# Patient Record
Sex: Male | Born: 1960 | Race: White | Hispanic: No | Marital: Single | Smoking: Current every day smoker
Health system: Southern US, Community
[De-identification: ages and names within clinical notes are randomized; demographics above are authoritative.]

## PROBLEM LIST (undated history)

## (undated) DIAGNOSIS — F419 Anxiety disorder, unspecified: Secondary | ICD-10-CM

## (undated) DIAGNOSIS — G709 Myoneural disorder, unspecified: Secondary | ICD-10-CM

## (undated) DIAGNOSIS — F32A Depression, unspecified: Secondary | ICD-10-CM

## (undated) DIAGNOSIS — S83209A Unspecified tear of unspecified meniscus, current injury, unspecified knee, initial encounter: Secondary | ICD-10-CM

## (undated) DIAGNOSIS — F329 Major depressive disorder, single episode, unspecified: Secondary | ICD-10-CM

## (undated) DIAGNOSIS — K759 Inflammatory liver disease, unspecified: Secondary | ICD-10-CM

## (undated) DIAGNOSIS — F4310 Post-traumatic stress disorder, unspecified: Secondary | ICD-10-CM

## (undated) DIAGNOSIS — F99 Mental disorder, not otherwise specified: Secondary | ICD-10-CM

## (undated) DIAGNOSIS — E079 Disorder of thyroid, unspecified: Secondary | ICD-10-CM

## (undated) DIAGNOSIS — E039 Hypothyroidism, unspecified: Secondary | ICD-10-CM

## (undated) HISTORY — PX: TESTICLAR CYST EXCISION: SHX2492

## (undated) HISTORY — PX: NO PAST SURGERIES: SHX2092

---

## 2011-11-25 ENCOUNTER — Encounter (HOSPITAL_COMMUNITY): Payer: Self-pay | Admitting: Emergency Medicine

## 2011-11-25 ENCOUNTER — Emergency Department (HOSPITAL_COMMUNITY)
Admission: EM | Admit: 2011-11-25 | Discharge: 2011-11-27 | Disposition: A | Discharge status: Other Acute Inpatient Hospital | Payer: Medicaid Other | Attending: Emergency Medicine | Admitting: Emergency Medicine

## 2011-11-25 DIAGNOSIS — R45850 Homicidal ideations: Secondary | ICD-10-CM | POA: Insufficient documentation

## 2011-11-25 DIAGNOSIS — F1010 Alcohol abuse, uncomplicated: Secondary | ICD-10-CM | POA: Insufficient documentation

## 2011-11-25 DIAGNOSIS — R45851 Suicidal ideations: Secondary | ICD-10-CM | POA: Insufficient documentation

## 2011-11-25 DIAGNOSIS — Z79899 Other long term (current) drug therapy: Secondary | ICD-10-CM | POA: Insufficient documentation

## 2011-11-25 DIAGNOSIS — E079 Disorder of thyroid, unspecified: Secondary | ICD-10-CM | POA: Insufficient documentation

## 2011-11-25 DIAGNOSIS — R109 Unspecified abdominal pain: Secondary | ICD-10-CM | POA: Insufficient documentation

## 2011-11-25 HISTORY — DX: Disorder of thyroid, unspecified: E07.9

## 2011-11-25 LAB — CBC
HCT: 40 % (ref 39.0–52.0)
MCV: 87.5 fL (ref 78.0–100.0)
RBC: 4.57 MIL/uL (ref 4.22–5.81)
WBC: 7.2 10*3/uL (ref 4.0–10.5)

## 2011-11-25 LAB — COMPREHENSIVE METABOLIC PANEL
BUN: 17 mg/dL (ref 6–23)
CO2: 22 mEq/L (ref 19–32)
Chloride: 99 mEq/L (ref 96–112)
Creatinine, Ser: 0.86 mg/dL (ref 0.50–1.35)
GFR calc Af Amer: >90 mL/min (ref >90)
GFR calc non Af Amer: >90 mL/min (ref >90)
Total Bilirubin: 0.4 mg/dL (ref 0.3–1.2)

## 2011-11-25 LAB — ETHANOL: Alcohol, Ethyl (B): 187 mg/dL — ABNORMAL HIGH (ref 0–11)

## 2011-11-25 LAB — RAPID URINE DRUG SCREEN, HOSP PERFORMED: Opiates: NOT DETECTED

## 2011-11-25 MED ORDER — LORAZEPAM 1 MG PO TABS
1.0000 mg | ORAL_TABLET | Freq: Three times a day (TID) | ORAL | Status: DC | PRN
  Administered 2011-11-26 – 2011-11-27 (×2): 1 mg via ORAL
  Filled 2011-11-25 (×2): qty 1

## 2011-11-25 MED ORDER — ZOLPIDEM TARTRATE 5 MG PO TABS
5.0000 mg | ORAL_TABLET | Freq: Every evening | ORAL | Status: DC | PRN
  Administered 2011-11-26: 5 mg via ORAL
  Filled 2011-11-25: qty 1

## 2011-11-25 MED ORDER — ZIPRASIDONE MESYLATE 20 MG IM SOLR
20.0000 mg | Freq: Once | INTRAMUSCULAR | Status: AC
  Administered 2011-11-25: 20 mg via INTRAMUSCULAR
  Filled 2011-11-25: qty 20

## 2011-11-25 MED ORDER — NICOTINE 21 MG/24HR TD PT24
21.0000 mg | MEDICATED_PATCH | Freq: Every day | TRANSDERMAL | Status: DC
  Filled 2011-11-25: qty 1

## 2011-11-25 NOTE — ED Provider Notes (Signed)
History     CSN: [id number]  Arrival date & time 11/25/11  1454   First MD Initiated Contact with Patient 11/25/11 1528      Chief Complaint  Patient presents with  . V70.1  . Abdominal Pain    The history is provided by the patient.   patient reports 5 days of drinking alcohol and using cocaine.  He now reports thoughts of wanting to hurt himself and wanting to hurt others.  He states he would be better off dead.  He's been having auditory hallucinations.  He also reports use of marijuana.  He has no specific plans at this time.  He is compliant with his medications.  He has a history of hypothyroidism is on Synthroid.  He denies fevers or chills.  He has no nausea or vomiting.  He reports some mild upper abdominal discomfort for several days.   Past Medical History  Diagnosis Date  . Thyroid disease     No past surgical history on file.  No family history on file.  History  Substance Use Topics  . Smoking status: Not on file  . Smokeless tobacco: Not on file  . Alcohol Use:       Review of Systems  All other systems reviewed and are negative.    Allergies  Review of patient's allergies indicates no known allergies.  Home Medications   Current Outpatient Rx  Name Route Sig Dispense Refill  . IBUPROFEN 200 MG PO TABS Oral Take 800 mg by mouth every 6 (six) hours as needed. Pain    . LEVOTHYROXINE SODIUM 100 MCG PO TABS Oral Take 100 mcg by mouth daily.    Marland Kitchen PAROXETINE HCL 20 MG PO TABS Oral Take 40 mg by mouth every morning.      BP 133/80  Pulse 91  Temp 99 F (37.2 C) (Oral)  Resp 18  SpO2 97%  Physical Exam  Nursing note and vitals reviewed. Constitutional: He is oriented to person, place, and time. He appears well-developed and well-nourished.  HENT:  Head: Normocephalic and atraumatic.  Eyes: EOM are normal.  Neck: Normal range of motion.  Cardiovascular: Normal rate, regular rhythm, normal heart sounds and intact distal pulses.     Pulmonary/Chest: Effort normal and breath sounds normal. No respiratory distress.  Abdominal: Soft. He exhibits no distension. There is no tenderness.  Musculoskeletal: Normal range of motion.  Neurological: He is alert and oriented to person, place, and time.  Skin: Skin is warm and dry.  Psychiatric:       Manic behavior.  Continual movements in the room    ED Course  Procedures (including critical care time)  Labs Reviewed  CBC - Abnormal; Notable for the following:    MCHC 36.3 (*)     All other components within normal limits  URINE RAPID DRUG SCREEN (HOSP PERFORMED) - Abnormal; Notable for the following:    Cocaine POSITIVE (*)     All other components within normal limits  COMPREHENSIVE METABOLIC PANEL  ETHANOL   No results found.   No diagnosis found.    MDM  [facility] to evaluate at this time for placement for SI and HI        Lyanne Co, MD 11/25/11 [phone]

## 2011-11-25 NOTE — ED Notes (Signed)
Pt has been changed into Edward [name] scrubs and wanded by security. 

## 2011-11-25 NOTE — ED Notes (Signed)
3 duffle bags and 1 pt belongings bag locked in activity room

## 2011-11-25 NOTE — ED Notes (Signed)
Report to Ninetta Lights, RN in Psych ED. Transferred to rm 43- clothing placed in locker 43

## 2011-11-25 NOTE — ED Notes (Signed)
Hx gastritis per patient-- abd pain started 3 days ago-- after drinking ETOH, admits to drinking heavily for past 5 days, has been smoking crack, last used last night,   Has been hearing voices for 3 days- states "would be better off dead" it is a woman's voice-sounds like mother, has been in state hospital in [place], has been treated for PTSD, major depression and anxiety, bipolar.   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  Has exaggerated movements, facial movements.

## 2011-11-25 NOTE — ED Notes (Signed)
Up to the bathroom ambulatory w/o difficulty 

## 2011-11-25 NOTE — ED Notes (Signed)
Pt has two large duffle bags and one pt belonging bags. Pt has a knife locked up by security

## 2011-11-26 LAB — HEPATIC FUNCTION PANEL
ALT: 149 U/L — ABNORMAL HIGH (ref 0–53)
Albumin: 3.5 g/dL (ref 3.5–5.2)
Alkaline Phosphatase: 73 U/L (ref 39–117)
Total Protein: 7.3 g/dL (ref 6.0–8.3)

## 2011-11-26 LAB — LIPASE, BLOOD: Lipase: 82 U/L — ABNORMAL HIGH (ref 11–59)

## 2011-11-26 MED ORDER — GABAPENTIN 600 MG PO TABS
600.0000 mg | ORAL_TABLET | Freq: Every day | ORAL | Status: DC
  Filled 2011-11-26: qty 1

## 2011-11-26 MED ORDER — THIAMINE HCL 100 MG/ML IJ SOLN: 100.0000 mg | Freq: Every day | INTRAMUSCULAR | Status: DC

## 2011-11-26 MED ORDER — LORAZEPAM 1 MG PO TABS
0.0000 mg | ORAL_TABLET | Freq: Four times a day (QID) | ORAL | Status: DC
Start: 2011-11-26 — End: 2011-11-27
  Administered 2011-11-26 – 2011-11-27 (×2): 1 mg via ORAL
  Filled 2011-11-26 (×2): qty 1

## 2011-11-26 MED ORDER — GABAPENTIN 300 MG PO CAPS
600.0000 mg | ORAL_CAPSULE | Freq: Every day | ORAL | Status: DC
  Administered 2011-11-26 – 2011-11-27 (×2): 600 mg via ORAL
  Filled 2011-11-26 (×2): qty 2

## 2011-11-26 MED ORDER — PAROXETINE HCL 20 MG PO TABS
40.0000 mg | ORAL_TABLET | Freq: Every day | ORAL | Status: DC
  Administered 2011-11-26 – 2011-11-27 (×2): 40 mg via ORAL
  Filled 2011-11-26 (×2): qty 2

## 2011-11-26 MED ORDER — LORAZEPAM 1 MG PO TABS: 0.0000 mg | ORAL_TABLET | Freq: Two times a day (BID) | ORAL | Status: DC

## 2011-11-26 MED ORDER — VITAMIN B-1 100 MG PO TABS
100.0000 mg | ORAL_TABLET | Freq: Every day | ORAL | Status: DC
  Administered 2011-11-26 – 2011-11-27 (×2): 100 mg via ORAL
  Filled 2011-11-26 (×2): qty 1

## 2011-11-26 MED ORDER — FOLIC ACID 1 MG PO TABS
1.0000 mg | ORAL_TABLET | Freq: Every day | ORAL | Status: DC
  Administered 2011-11-26 – 2011-11-27 (×2): 1 mg via ORAL
  Filled 2011-11-26 (×2): qty 1

## 2011-11-26 MED ORDER — LURASIDONE HCL 40 MG PO TABS
40.0000 mg | ORAL_TABLET | Freq: Every day | ORAL | Status: DC
  Administered 2011-11-27: 40 mg via ORAL
  Filled 2011-11-26 (×2): qty 1

## 2011-11-26 MED ORDER — LEVOTHYROXINE SODIUM 100 MCG PO TABS
100.0000 ug | ORAL_TABLET | Freq: Every day | ORAL | Status: DC
  Administered 2011-11-26 – 2011-11-27 (×2): 100 ug via ORAL
  Filled 2011-11-26 (×2): qty 1

## 2011-11-26 MED ORDER — ADULT MULTIVITAMIN W/MINERALS CH
1.0000 | ORAL_TABLET | Freq: Every day | ORAL | Status: DC
  Administered 2011-11-26 – 2011-11-27 (×2): 1 via ORAL
  Filled 2011-11-26 (×2): qty 1

## 2011-11-26 NOTE — B[facility] Counselor (Signed)
Pt submitted for admission to [facility] by ED assessment counselor. Consulted with Shalita Forrest, AC who confirmed no adult beds are currently available. Consulted Neil Mashburn, PA who recommended Pt be re-run by psychiatrist when appropriate bed is available. Notified ED counselor of disposition.  Oluwadarasimi Favor Ellis Teodora Baumgarten Jr, LPC 

## 2011-11-26 NOTE — ED Notes (Signed)
Up to bathroom

## 2011-11-26 NOTE — ED Notes (Signed)
Spoke to [employer], told him the pt drinks ETOH more than we thought on admission and is starting to have withdrawal symptoms. EDP will put pt on the CIWA protocol.

## 2011-11-26 NOTE — ED Notes (Signed)
Pt up to bathroom.

## 2011-11-26 NOTE — ED Notes (Signed)
RN in drawing labs on pt

## 2011-11-26 NOTE — ED Notes (Signed)
Watching tv, eating sandwich

## 2011-11-26 NOTE — ED Provider Notes (Signed)
Pt resting this am.  Here with abdominal pain, substance abuse problems.  Slight increase in lfts and lipase noted.  Will repeat this am.  Will continue to monitor.  Filed Vitals:   11/26/11 0530  BP: 126/83  Pulse: 66  Temp: 98 F (36.7 C)  Resp: 21     Celene Kras, MD 11/26/11 [phone]

## 2011-11-26 NOTE — ED Notes (Signed)
EDP in with pt 

## 2011-11-26 NOTE — BH Assessment (Signed)
Assessment Note   Edward [name] is a 51 y.o. male who presents to [facility] voluntarily, endorsing SI with a plan to cut his wrists. Pt also is endorsing auditory hallucination, stating he hears voices when he is going through a stressful event. He reports he has a history of metal health concerns, starting 20 years ago, and has been diagnosed with PTSD. He reports current precipitating factors include breaking up with girlfriend, financial difficulties, and no family support. He reports 3 previous suicide attempts by overdose, most recent of which was in 2011. Pt reports has received inpatient mental health services several times in [place]. Pt states he was receiving outpatient treatment at [facility] earlier this year but is not currently receiving treatment. He denies being on any medication.   Pt denies HI, and visual hallucinations. Pt endorses SA, stating he uses 700 dollars worth of crack cocaine, a "couple of times a month" and drinks several beers a few times monthly.   Pt reports he can not contract for safety at this time.     Axis I: 296.34  Major Depressive Disorder, Recurrent, Severe With Psychotic Features  Axis II: Deferred Axis III:  Past Medical History  Diagnosis Date  . Thyroid disease    Axis IV: housing problems, other psychosocial or environmental problems and problems related to social environment Axis V: 31-40 impairment in reality testing  Past Medical History:  Past Medical History  Diagnosis Date  . Thyroid disease     No past surgical history on file.  Family History: No family history on file.  Social History:  does not have a smoking history on file. He does not have any smokeless tobacco history on file. His alcohol and drug histories not on file.  Additional Social History:  Alcohol / Drug Use History of alcohol / drug use?: Yes Substance #1 Name of Substance 1: alcohol 1 - Amount (size/oz): several beers 1 - Frequency: a couple times a month 1 -  Duration: for years 1 - Last Use / Amount: 11/25/11 Substance #2 Name of Substance 2: crack cocaine 2 - Age of First Use: 40s  2 - Amount (size/oz): 700 dollars worth 2 - Frequency: a couple of times a month 2 - Duration: on and off for 10 years 2 - Last Use / Amount: 11/25/11  CIWA: CIWA-Ar BP: 113/68 mmHg Pulse Rate: 80  COWS:    Allergies: No Known Allergies  Home Medications:  (Not in a hospital admission)  OB/GYN Status:  No LMP for male patient.  General Assessment Data Location of Assessment: [facility] Living Arrangements: Alone;Other (Comment) (homeless) Can pt return to current living arrangement?: Yes Admission Status: Voluntary Is patient capable of signing voluntary admission?: Yes Transfer from: Acute Hospital Referral Source: MD  Education Status Is patient currently in school?: No Highest grade of school patient has completed: GED  Risk to self Suicidal Ideation: Yes-Currently Present Suicidal Intent: Yes-Currently Present Is patient at risk for suicide?: Yes Suicidal Plan?: Yes-Currently Present Specify Current Suicidal Plan: cut wrists  Access to Means: No What has been your use of drugs/alcohol within the last 12 months?: alcohol and crack cocaine Previous Attempts/Gestures: Yes How many times?: 3  Other Self Harm Risks: none Triggers for Past Attempts: Hallucinations Intentional Self Injurious Behavior: None Family Suicide History: No Recent stressful life event(s): Conflict (Comment) (seperation from girlfriend) Persecutory voices/beliefs?: No Depression: Yes Depression Symptoms: Despondent;Loss of interest in usual pleasures;Feeling worthless/self pity;Guilt Substance abuse history and/or treatment for substance abuse?: Yes  Suicide prevention information given to non-admitted patients: Not applicable  Risk to Others Homicidal Ideation: No Thoughts of Harm to Others: No Current Homicidal Intent: No Current Homicidal Plan: No Access to Homicidal  Means: No Identified Victim: none History of harm to others?: No Assessment of Violence: None Noted Violent Behavior Description: cooperative Does patient have access to weapons?: No Criminal Charges Pending?: No Does patient have a court date: No  Psychosis Hallucinations: None noted Delusions: None noted  Mental Status Report Appear/Hygiene: Disheveled Eye Contact: Good Motor Activity: Unremarkable Speech: Logical/coherent Level of Consciousness: Quiet/awake Mood: Depressed Affect: Appropriate to circumstance Anxiety Level: None Thought Processes: Coherent;Relevant Judgement: Impaired Orientation: Person;Place;Time;Situation Obsessive Compulsive Thoughts/Behaviors: None  Cognitive Functioning Concentration: Normal Memory: Recent Intact;Remote Intact IQ: Average Insight: Poor Impulse Control: Poor Appetite: Fair Weight Loss: 0  Weight Gain: 0  Sleep: Decreased Total Hours of Sleep:  (states he has not slept in 3 days) Vegetative Symptoms: Staying in bed  ADLScreening [facility] Assessment Services) Patient's cognitive ability adequate to safely complete daily activities?: Yes Patient able to express need for assistance with ADLs?: Yes Independently performs ADLs?: Yes  Abuse/Neglect [facility]) Physical Abuse: Denies Verbal Abuse: Denies Sexual Abuse: Denies  Prior Inpatient Therapy Prior Inpatient Therapy: Yes Prior Therapy Dates: 2011 Prior Therapy Facilty/Provider(s): [place]  Reason for Treatment: auditory hallucinations  Prior Outpatient Therapy Prior Outpatient Therapy: Yes Prior Therapy Dates: 2012 Prior Therapy Facilty/Provider(s): [facility] Reason for Treatment: AH  ADL Screening (condition at time of admission) Patient's cognitive ability adequate to safely complete daily activities?: Yes Patient able to express need for assistance with ADLs?: Yes Independently performs ADLs?: Yes Weakness of Legs: None Weakness of Arms/Hands: None  Home Assistive  Devices/Equipment Home Assistive Devices/Equipment: None    Abuse/Neglect Assessment (Assessment to be complete while patient is alone) Physical Abuse: Denies Verbal Abuse: Denies Sexual Abuse: Denies Exploitation of patient/patient's resources: Denies Self-Neglect: Denies Values / Beliefs Cultural Requests During Hospitalization: None Spiritual Requests During Hospitalization: None   Advance Directives (For Healthcare) Advance Directive: Patient does not have advance directive;Patient would not like information Pre-existing out of facility DNR order (yellow form or pink MOST form): No Nutrition Screen Diet: Regular Unintentional weight loss greater than 10lbs within the last month: No Problems chewing or swallowing foods and/or liquids: No Home Tube Feeding or Total Parenteral Nutrition (TPN): No Patient appears severely malnourished: No  Additional Information 1:1 In Past 12 Months?: No CIRT Risk: No Elopement Risk: No Does patient have medical clearance?: Yes     Disposition:  Disposition Disposition of Patient: Referred to;Inpatient treatment program Type of inpatient treatment program: Adult  On Site Evaluation by:   Reviewed with Physician:     Georgina Quint A 11/26/2011 2:12 AM

## 2011-11-27 ENCOUNTER — Inpatient Hospital Stay (HOSPITAL_COMMUNITY)
Admission: AD | Admit: 2011-11-27 | Discharge: 2011-12-01 | DRG: 897 | Disposition: A | Discharge status: Home / Self Care | Payer: Medicaid Other | Source: Ambulatory Visit | Attending: Psychiatry | Admitting: Psychiatry

## 2011-11-27 ENCOUNTER — Encounter (HOSPITAL_COMMUNITY): Payer: Self-pay | Admitting: *Deleted

## 2011-11-27 DIAGNOSIS — G4700 Insomnia, unspecified: Secondary | ICD-10-CM | POA: Diagnosis present

## 2011-11-27 DIAGNOSIS — F10939 Alcohol use, unspecified with withdrawal, unspecified: Principal | ICD-10-CM | POA: Diagnosis present

## 2011-11-27 DIAGNOSIS — F10239 Alcohol dependence with withdrawal, unspecified: Principal | ICD-10-CM | POA: Diagnosis present

## 2011-11-27 DIAGNOSIS — F1994 Other psychoactive substance use, unspecified with psychoactive substance-induced mood disorder: Secondary | ICD-10-CM | POA: Diagnosis present

## 2011-11-27 DIAGNOSIS — F1920 Other psychoactive substance dependence, uncomplicated: Secondary | ICD-10-CM | POA: Diagnosis present

## 2011-11-27 DIAGNOSIS — F1410 Cocaine abuse, uncomplicated: Secondary | ICD-10-CM | POA: Diagnosis present

## 2011-11-27 DIAGNOSIS — Z79899 Other long term (current) drug therapy: Secondary | ICD-10-CM

## 2011-11-27 DIAGNOSIS — F1020 Alcohol dependence, uncomplicated: Secondary | ICD-10-CM | POA: Diagnosis present

## 2011-11-27 DIAGNOSIS — F39 Unspecified mood [affective] disorder: Secondary | ICD-10-CM | POA: Diagnosis present

## 2011-11-27 DIAGNOSIS — E079 Disorder of thyroid, unspecified: Secondary | ICD-10-CM | POA: Diagnosis present

## 2011-11-27 DIAGNOSIS — F1420 Cocaine dependence, uncomplicated: Secondary | ICD-10-CM

## 2011-11-27 HISTORY — DX: Inflammatory liver disease, unspecified: K75.9

## 2011-11-27 HISTORY — DX: Myoneural disorder, unspecified: G70.9

## 2011-11-27 MED ORDER — ONDANSETRON 4 MG PO TBDP: 4.0000 mg | ORAL_TABLET | Freq: Four times a day (QID) | ORAL | Status: AC | PRN

## 2011-11-27 MED ORDER — PAROXETINE HCL 20 MG PO TABS
40.0000 mg | ORAL_TABLET | Freq: Every day | ORAL | Status: DC
  Administered 2011-11-28 – 2011-12-01 (×4): 40 mg via ORAL
  Filled 2011-11-27: qty 1
  Filled 2011-11-27 (×5): qty 2

## 2011-11-27 MED ORDER — GABAPENTIN 600 MG PO TABS
600.0000 mg | ORAL_TABLET | Freq: Every day | ORAL | Status: DC
  Administered 2011-11-28 – 2011-11-30 (×3): 600 mg via ORAL
  Filled 2011-11-27 (×6): qty 1

## 2011-11-27 MED ORDER — HYDROXYZINE HCL 25 MG PO TABS
25.0000 mg | ORAL_TABLET | Freq: Four times a day (QID) | ORAL | Status: AC | PRN
  Administered 2011-11-29: 25 mg via ORAL

## 2011-11-27 MED ORDER — LORAZEPAM 1 MG PO TABS: 1.0000 mg | ORAL_TABLET | Freq: Three times a day (TID) | ORAL | Status: DC

## 2011-11-27 MED ORDER — LORAZEPAM 1 MG PO TABS
1.0000 mg | ORAL_TABLET | Freq: Four times a day (QID) | ORAL | Status: DC | PRN
  Administered 2011-11-27 – 2011-12-01 (×3): 1 mg via ORAL
  Filled 2011-11-27 (×4): qty 1

## 2011-11-27 MED ORDER — MAGNESIUM HYDROXIDE 400 MG/5ML PO SUSP: 30.0000 mL | Freq: Every day | ORAL | Status: DC | PRN

## 2011-11-27 MED ORDER — LOPERAMIDE HCL 2 MG PO CAPS: 2.0000 mg | ORAL_CAPSULE | ORAL | Status: AC | PRN

## 2011-11-27 MED ORDER — LURASIDONE HCL 40 MG PO TABS
40.0000 mg | ORAL_TABLET | Freq: Every day | ORAL | Status: DC
  Administered 2011-11-28 – 2011-12-01 (×4): 40 mg via ORAL
  Filled 2011-11-27 (×5): qty 1
  Filled 2011-11-27: qty 4

## 2011-11-27 MED ORDER — LORAZEPAM 1 MG PO TABS: 1.0000 mg | ORAL_TABLET | Freq: Two times a day (BID) | ORAL | Status: DC

## 2011-11-27 MED ORDER — VITAMIN B-1 100 MG PO TABS
100.0000 mg | ORAL_TABLET | Freq: Every day | ORAL | Status: DC
  Administered 2011-11-28 – 2011-12-01 (×4): 100 mg via ORAL
  Filled 2011-11-27 (×5): qty 1

## 2011-11-27 MED ORDER — LORAZEPAM 1 MG PO TABS: 1.0000 mg | ORAL_TABLET | Freq: Every day | ORAL | Status: DC

## 2011-11-27 MED ORDER — IBUPROFEN 600 MG PO TABS
600.0000 mg | ORAL_TABLET | Freq: Four times a day (QID) | ORAL | Status: DC | PRN
  Administered 2011-11-27 (×2): 600 mg via ORAL
  Filled 2011-11-27: qty 1

## 2011-11-27 MED ORDER — IBUPROFEN 800 MG PO TABS
800.0000 mg | ORAL_TABLET | Freq: Three times a day (TID) | ORAL | Status: DC | PRN
  Administered 2011-11-28 – 2011-12-01 (×5): 800 mg via ORAL
  Filled 2011-11-27 (×5): qty 1

## 2011-11-27 MED ORDER — IBUPROFEN 600 MG PO TABS
ORAL_TABLET | ORAL | Status: AC
  Filled 2011-11-27: qty 1

## 2011-11-27 MED ORDER — ALUM & MAG HYDROXIDE-SIMETH 200-200-20 MG/5ML PO SUSP: 30.0000 mL | ORAL | Status: DC | PRN

## 2011-11-27 MED ORDER — LEVOTHYROXINE SODIUM 100 MCG PO TABS
100.0000 ug | ORAL_TABLET | Freq: Every day | ORAL | Status: DC
  Administered 2011-11-28 – 2011-12-01 (×4): 100 ug via ORAL
  Filled 2011-11-27 (×2): qty 1
  Filled 2011-11-27: qty 2
  Filled 2011-11-27 (×3): qty 1

## 2011-11-27 MED ORDER — NICOTINE 21 MG/24HR TD PT24
21.0000 mg | MEDICATED_PATCH | Freq: Every day | TRANSDERMAL | Status: DC
  Filled 2011-11-27 (×2): qty 1

## 2011-11-27 MED ORDER — VITAMIN B-12 100 MCG PO TABS
100.0000 ug | ORAL_TABLET | Freq: Every day | ORAL | Status: DC
  Administered 2011-11-27: 100 ug via ORAL
  Filled 2011-11-27: qty 1

## 2011-11-27 MED ORDER — THIAMINE HCL 100 MG/ML IJ SOLN
100.0000 mg | Freq: Once | INTRAMUSCULAR | Status: AC
  Administered 2011-11-27: 100 mg via INTRAMUSCULAR

## 2011-11-27 MED ORDER — ADULT MULTIVITAMIN W/MINERALS CH
1.0000 | ORAL_TABLET | Freq: Every day | ORAL | Status: DC
  Administered 2011-11-28 – 2011-12-01 (×4): 1 via ORAL
  Filled 2011-11-27 (×5): qty 1

## 2011-11-27 MED ORDER — VITAMIN B-12 100 MCG PO TABS
100.0000 ug | ORAL_TABLET | Freq: Every day | ORAL | Status: DC
  Administered 2011-11-28 – 2011-12-01 (×4): 100 ug via ORAL
  Filled 2011-11-27 (×6): qty 1

## 2011-11-27 MED ORDER — LORAZEPAM 1 MG PO TABS
1.0000 mg | ORAL_TABLET | Freq: Four times a day (QID) | ORAL | Status: DC
  Administered 2011-11-28 (×4): 1 mg via ORAL
  Filled 2011-11-27 (×6): qty 1

## 2011-11-27 NOTE — Consult Note (Signed)
Reason for Consult: Substance induced mood disorder. Alcohol intoxication, cocaine intoxication Referring Physician: ED physician  Edward [name] is an 51 y.o. male.  HPI: Patient was seen and chart reviewed. Patient has been suffering with the chronic, recurrent, substance abuse problems, especially alcohol dependence, and cocaine abuse versus dependence. Patient has been suffered from time to time emotional problems, mostly depression, and possible bipolar psychosis. Patient was received lithium while in [place] and recently he was admitted to is [facility] and then treated with new antipsychotic medication Latuda. Patient reported he was started drinking alcohol when he was 51 years old and has a long period of sober from age [age] years old. 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 Patient reportedly become homeless, unable to keep up with the her family members. Patient reportedly has a 68 years old daughter who lives in [place]. Patient reportedly has been staying with a girlfriend for the last 6 years. Reportedly he has been doing drugs and drink alcohol. Patient reportedly left his girlfriend in [place], started living in a shelter in [place]. Patient reportedly met a girl in [place], who helped him to call the emergency medical services. Reportedly he was also received the electroconvulsive therapy, while he  was living in [place]. Patient is currently homeless, unemployed, no psychosocial support. His urine drug screen was positive for cocaine and alcohol. He has family history of for alcoholism in multiple family members.  Mental status examination patient was appeared as per his stated age, dressed in a blue scrubs has the multiple tattoos all over his extremities  and he went on trunk. Patient has depressed, and anxious. Mood, with appropriate bright affect. He has a increased rate of speech to the point confabulate occasionally. He endorses being suicidal without plan or intentions. He has no homicidal ideations. He has no evidence of psychotic symptoms, but he endorses having the auditory hallucinations, commanding to hurt himself in the past.  Past Medical History  Diagnosis Date  . Thyroid disease     No past surgical history on file.  No family history on file.  Social History:  does not have a smoking history on file. He does not have any smokeless tobacco history on file. His alcohol and drug histories not on file.  Allergies: No Known Allergies  Medications: I have reviewed the patient's current medications.  Results for orders placed during the hospital encounter of 11/25/11 (from the past 48 hour(s))  HEPATIC FUNCTION PANEL     Status: Abnormal   Collection Time   11/26/11  9:00 AM      Component Value Range Comment   Total Protein 7.3  6.0 - 8.3 g/dL    Albumin 3.5  3.5 - 5.2 g/dL    AST 213 (*) 0 - 37 U/L    ALT 149 (*) 0 - 53 U/L    Alkaline Phosphatase 73  39 - 117 U/L    Total Bilirubin 0.7  0.3 - 1.2 mg/dL    Bilirubin, Direct 0.2  0.0 - 0.3 mg/dL    Indirect Bilirubin 0.5  0.3 - 0.9 mg/dL   LIPASE, BLOOD     Status: Abnormal   Collection Time   11/26/11  9:00 AM  Component Value Range Comment   Lipase 82 (*) 11 - 59 U/L     No results found.  No anxiety, No psychosis and Positive for anxiety, bad mood, depression, excessive alcohol consumption, illegal drug usage and sleep disturbance Blood pressure 147/89, pulse 74, temperature 98.7 F (37.1 C), temperature source Oral, resp. rate 18, SpO2 97.00%.   Assessment/Plan: Alcohol dependence and intoxication. Cocaine dependence. Substance-induced mood disorder.  Recommended acute psychiatric hospitalization for alcohol detox treatment and possible long-term  rehabilitation services.  Edward [name],JANARDHAHA R. 11/27/2011, 5:37 PM

## 2011-11-27 NOTE — ED Provider Notes (Addendum)
Patient really presented for heavy use of alcohol and cocaine and then express suicidal thoughts stated that he would be better off dead stated was positive for auditory hallucinations. Patient is pending behavioral health placement has been assessed by the behavioral health team on August 4 patient was stable overnight.  Shelda Jakes, MD 11/27/11 [phone]   Patient's vitamin B12 orally has been ordered that was an oversight that he was supposed to get that once a day when he first came in that has now been corrected I have ordered it.     Shelda Jakes, MD 11/27/11 [phone]

## 2011-11-27 NOTE — ED Notes (Addendum)
Report called to Marcelino Duster RN at [facility]. Pt has been accepted by Dr. Allena Katz, assigned to Dr. Catha Brow in Room 300-2, per report from [facility].

## 2011-11-27 NOTE — Progress Notes (Signed)
Patient ID: Edward [name], male   DOB: 1960/12/17, 51 y.o.   MRN: [id number] Pt admitted voluntarily to [facility] for assistance with detox, depression and suicidal ideation.  Pt states he has been living with girlfriend in [place] for several years.  Pt states his girlfriend abuses alcohol.  Pt states that he has used alcohol as well with her.  Pt stated that everything "came to a head" on 7/31.  Pt states he felt depressed, beaten down and ashamed because of the substance abuse.  Pt stated he spoke with his sister and decided to leave the relationship to focus on getting help.  Pt stated he did not let his girlfriend know he was going to leave and this made him feel even worse.  Pt stated he began having thoughts to hurt himself.  Pt states he has had 3 attempts in the past by OD.  Pt states he is also hearing voices telling him to hurt himself, he is no good and God does not love him.  Pt denies visual hallucination and homicidal ideation.  Pt states he is unsure of where he will live upon d/c but that going back to [facility] is not an option.  Fifteen minute checks initiated.  Pt oriented to unit.

## 2011-11-27 NOTE — ED Provider Notes (Signed)
Patient has been accepted at [facility] by Dr. Allena Katz.  Dione Booze, MD 11/27/11 [phone]

## 2011-11-27 NOTE — Tx Team (Signed)
Initial Interdisciplinary Treatment Plan  PATIENT STRENGTHS: (choose at least two) Ability for insight Average or above average intelligence Communication skills General fund of knowledge Motivation for treatment/growth Supportive family/friends  PATIENT STRESSORS: Substance abuse   PROBLEM LIST: Problem List/Patient Goals Date to be addressed Date deferred Reason deferred Estimated date of resolution  Suicidal ideation 11/27/2011     depression 11/27/2011     Substance abuse 11/27/2011                                          DISCHARGE CRITERIA:  Ability to meet basic life and health needs Improved stabilization in mood, thinking, and/or behavior Motivation to continue treatment in a less acute level of care Verbal commitment to aftercare and medication compliance  PRELIMINARY DISCHARGE PLAN: Attend aftercare/continuing care group Participate in family therapy Placement in alternative living arrangements  PATIENT/FAMIILY INVOLVEMENT: This treatment plan has been presented to and reviewed with the patient, Ac Colan.  The patient and family have been given the opportunity to ask questions and make suggestions.  Hoover Browns 11/27/2011, 10:23 PM

## 2011-11-27 NOTE — ED Notes (Signed)
Dr. Juluis Rainier MD at bedside.

## 2011-11-28 DIAGNOSIS — F1994 Other psychoactive substance use, unspecified with psychoactive substance-induced mood disorder: Secondary | ICD-10-CM | POA: Diagnosis present

## 2011-11-28 DIAGNOSIS — F1020 Alcohol dependence, uncomplicated: Secondary | ICD-10-CM | POA: Diagnosis present

## 2011-11-28 DIAGNOSIS — F1410 Cocaine abuse, uncomplicated: Secondary | ICD-10-CM | POA: Diagnosis present

## 2011-11-28 DIAGNOSIS — T50904A Poisoning by unspecified drugs, medicaments and biological substances, undetermined, initial encounter: Secondary | ICD-10-CM

## 2011-11-28 LAB — T3, FREE: T3, Free: 3 pg/mL (ref 2.3–4.2)

## 2011-11-28 MED ORDER — ENSURE COMPLETE PO LIQD
237.0000 mL | Freq: Two times a day (BID) | ORAL | Status: DC
  Administered 2011-11-29 – 2011-12-01 (×5): 237 mL via ORAL

## 2011-11-28 MED ORDER — HYDROXYZINE HCL 50 MG PO TABS
50.0000 mg | ORAL_TABLET | Freq: Every evening | ORAL | Status: DC | PRN
  Administered 2011-11-28: 50 mg via ORAL
  Filled 2011-11-28 (×2): qty 1

## 2011-11-28 NOTE — Progress Notes (Signed)
Pt laying in bed resting with eyes closed. Respirations even and unlabored. No distress noted.  
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
## 2011-11-28 NOTE — Progress Notes (Signed)
[facility] Group Notes:  (Counselor/Nursing/MHT/Case Management/Adjunct)  11/28/2011 4:08 PM  Type of Therapy:  Psychoeducational Skills  Participation Level:  Active  Participation Quality:  Appropriate  Affect:  Appropriate  Cognitive:  Appropriate  Insight:  Good  Engagement in Group:  Good  Engagement in Therapy:  Good  Modes of Intervention:  Education  Summary of Progress/Problems: Psychoeducational group on recovery. Pt was present and participated in group and completed assignment handed out during group   Gwenevere Ghazi Patience 11/28/2011, 4:08 PM

## 2011-11-28 NOTE — Progress Notes (Signed)
[facility] Group Notes:  (Counselor/Nursing/MHT/Case Management/Adjunct)  11/28/2011 2:20  Type of Therapy:  Group Therapy   Participation Level:  None  Participation Quality:  Drowsy and asleep  Summary of Progress/Problems: Edward [name]  attended presentation by [facility] but slept throughout presentation.    [name] 11/28/2011 2:22 PM

## 2011-11-28 NOTE — Progress Notes (Signed)
Pt attended discharge planning group and actively participated in group.  SW provided pt with today's workbook.  Pt presents with flat affect and depressed mood.  Pt rates depression at a 10 and anxiety at an 8 today.  Pt endorses SI but contracts for safety on the unit.  Pt was open with sharing reason for entering the hospital.  Pt states that he was self medicating with alcohol, marijuana and crack cocaine.  Pt states that he is depressed, has PTSD and had thoughts of hurting himself, so he came here for help.  Pt states that he is in a 6 year relationship and states that his girlfriend uses and won't try to get clean when he does.  Pt states that he doesn't know if he will go back to this relationship this time if she doesn't agree to get clean too.  Pt states that he came to [place] and was staying in a motel.  Pt states that he is on disability for income.  Pt states that he wants long term treatment after detox and to stay in an [facility] after that.  SW will assess for appropriate referrals.  No further needs voiced by pt at this time.    Reyes Ivan, LCSWA 11/28/2011  9:32 AM
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
## 2011-11-28 NOTE — Progress Notes (Signed)
Nutrition Consult Note  Patient identified on the Nutrition Risk Report for unintentional weight loss greater than 10 pounds in the past month.   Body mass index is 25.77 kg/(m^2). Pt meets criteria for overweight based on current BMI.   - Pt reports not eating anything for the past 3 days r/t drinking beer "around the clock". Pt reports eating much better since admission. Pt reports losing 10 pounds unintentionally in the past 2 weeks. Pt interested in getting Ensure for additional nutrition. Pt getting daily multivitamin in addition to thiamine and vitamin B-12. Pt's potassium WNL on 8/3.   Pt meets criteria for severe protein calorie malnutrition of acute illness as evidenced by likely <50% estimated energy intake for 5 days-2 weeks PTA with 5.5% weight loss in the past 2 weeks per pt report.   Intervention: Ensure Complete BID. Pt without any nutrition educational needs. No further intervention needed at this time.   Marshall Cork Dietitian# [phone]

## 2011-11-28 NOTE — Progress Notes (Signed)
[facility] Group Notes:  (Counselor/Nursing/MHT/Case Management/Adjunct)  11/28/2011 11:12 AM  Type of Therapy:  Psychoeducational Skills  Participation Level:  Active  Participation Quality:  Appropriate, Attentive, Monopolizing, Redirectable, Sharing and Supportive  Affect:  Appropriate  Cognitive:  Alert, Appropriate and Oriented  Insight:  Good  Engagement in Group:  Good  Engagement in Therapy:  n/a  Modes of Intervention:  Activity, Education, Problem-solving, Socialization and Support  Summary of Progress/Problems: Senay attended Psychoeducational group that focused on using quality time with positive support systems/individuals to engage in healthy coping skills. Dmitriy participated in activity guessing about self and peers. Mj was *active and at times attempted to control the conversation but was redirectable while group discussed who their support systems are, how they can spend positive quality time with them as a coping skills and a way to strengthen their relationship. Maks was given homework assignment to find two ways to improve h* support systems and twenty activities *he can do to spend quality time with supports.   Wandra Scot 11/28/2011, 11:12 AM

## 2011-11-28 NOTE — Progress Notes (Signed)
Patient ID: Edward [name], male   DOB: 09-Feb-1961, 51 y.o.   MRN: [id number] He has been up and to groups interacting with peers and staff. Self inventory: depressed 10, hopeless 8, W/D agitation, SI of and off  Contracts for safety.  He wants to go to a long term treatment place.

## 2011-11-28 NOTE — Progress Notes (Signed)
11/28/2011      Time: 1500      Group Topic/Focus: The focus of this group is on discussing various styles of communication and communicating assertively using 'I' (feeling) statements.  Participation Level: Active  Participation Quality: Appropriate and Attentive  Affect: Appropriate  Cognitive: Oriented   Additional Comments: None.  Edward [name] 11/28/2011 3:39 PM
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
## 2011-11-29 MED ORDER — LORAZEPAM 1 MG PO TABS
1.0000 mg | ORAL_TABLET | Freq: Every day | ORAL | Status: AC
  Administered 2011-12-01: 1 mg via ORAL

## 2011-11-29 MED ORDER — LORAZEPAM 1 MG PO TABS
1.0000 mg | ORAL_TABLET | Freq: Two times a day (BID) | ORAL | Status: AC
  Administered 2011-11-30 (×2): 1 mg via ORAL
  Filled 2011-11-29 (×2): qty 1

## 2011-11-29 MED ORDER — DOXYCYCLINE HYCLATE 100 MG PO TABS
100.0000 mg | ORAL_TABLET | Freq: Two times a day (BID) | ORAL | Status: DC
  Administered 2011-11-29 – 2011-12-01 (×4): 100 mg via ORAL
  Filled 2011-11-29 (×6): qty 1

## 2011-11-29 MED ORDER — LORAZEPAM 1 MG PO TABS
1.0000 mg | ORAL_TABLET | Freq: Three times a day (TID) | ORAL | Status: AC
  Administered 2011-11-29 – 2011-11-30 (×3): 1 mg via ORAL
  Filled 2011-11-29: qty 1

## 2011-11-29 MED ORDER — BACITRACIN-NEOMYCIN-POLYMYXIN OINTMENT TUBE
TOPICAL_OINTMENT | Freq: Two times a day (BID) | CUTANEOUS | Status: DC
  Administered 2011-11-29 – 2011-11-30 (×3): 1 via TOPICAL
  Administered 2011-12-01: 09:00:00 via TOPICAL
  Filled 2011-11-29: qty 15

## 2011-11-29 MED ORDER — TRAZODONE HCL 50 MG PO TABS: 50.0000 mg | ORAL_TABLET | Freq: Every evening | ORAL | Status: DC | PRN

## 2011-11-29 MED ORDER — MIRTAZAPINE 15 MG PO TABS
7.5000 mg | ORAL_TABLET | Freq: Every day | ORAL | Status: DC
  Administered 2011-11-29: 7.5 mg via ORAL
  Filled 2011-11-29 (×3): qty 0.5

## 2011-11-29 NOTE — Progress Notes (Signed)
[facility] Adult Inpatient Family/Significant Other Suicide Prevention Education  Suicide Prevention Education:   Clinical research associate provided suicide prevention education directly to patient on 11/29/2011 at 4:10 PM; conversation included risk factors, warning signs and resources to contact for help. Mobile crisis services explained and contact card placed in chart for pt to receive at discharge.   [name] 11/29/2011, 6:44 PM

## 2011-11-29 NOTE — Progress Notes (Signed)
[facility] MD Progress Note  11/29/2011 3:02 PM  S: "I only got 2 hours of sleep. I am here detoxing from crack cocaine, alcohol and weed.  I use these drugs because my medicines are not working. I used to be on Remeron for sleep. It worked pretty well. I came here, they stopped it and given me Trazodone. Trazodone does not work. I have these open sores to my arms and foot and stomach. The last time that I had staph infections, it looked just like this".  Diagnosis:   Axis I: Substance induced mood disorder, Alcohol dependence, cocaine abuse Axis II: Deferred Axis III:  Past Medical History  Diagnosis Date  . Thyroid disease   . Hepatitis   . Neuromuscular disorder     pinched nerve   Axis IV: other psychosocial or environmental problems Axis V: 51-60 moderate symptoms  ADL's:  Impaired, some reddened spots to skin to arm areas, foot and abdomen.   Sleep: Poor  Appetite:  Good  Suicidal Ideation:  Plan:  No Intent:  No Means:  No Homicidal Ideation:  Plan:  No Intent:  No Means:  No  AEB (as evidenced by): per patient's reports  Mental Status Examination/Evaluation: Objective:  Appearance: Casual  Eye Contact::  Good  Speech:  Clear and Coherent  Volume:  Normal  Mood:  "Low"  Affect:  Flat  Thought Process:  Coherent  Orientation:  Full  Thought Content:  Rumination  Suicidal Thoughts:  No  Homicidal Thoughts:  No  Memory:  Immediate;   Good Recent;   Good Remote;   Good  Judgement:  Fair  Insight:  Fair  Psychomotor Activity:  Normal  Concentration:  Good  Recall:  Good  Akathisia:  No  Handed:  Right  AIMS (if indicated):     Assets:  Desire for Improvement  Sleep:  Number of Hours: 2    Vital Signs:Blood pressure 105/72, pulse 84, temperature 98.6 F (37 C), temperature source Oral, resp. rate 16, height 5' 8.5" (1.74 m), weight 78.019 kg (172 lb). Current Medications: Current Facility-Administered Medications  Medication Dose Route Frequency Provider  Last Rate Last Dose  . alum & mag hydroxide-simeth (MAALOX/MYLANTA) 200-200-20 MG/5ML suspension 30 mL  30 mL Oral Q4H PRN Curlene Labrum Readling, MD      . feeding supplement (ENSURE COMPLETE) liquid 237 mL  237 mL Oral BID BM Lavena Bullion, RD   237 mL at 11/29/11 1430  . gabapentin (NEURONTIN) tablet 600 mg  600 mg Oral Daily Curlene Labrum Readling, MD   600 mg at 11/29/11 0747  . hydrOXYzine (ATARAX/VISTARIL) tablet 25 mg  25 mg Oral Q6H PRN Ronny Bacon, MD   25 mg at 11/29/11 0243  . ibuprofen (ADVIL,MOTRIN) tablet 800 mg  800 mg Oral Q8H PRN Curlene Labrum Readling, MD   800 mg at 11/29/11 0749  . levothyroxine (SYNTHROID, LEVOTHROID) tablet 100 mcg  100 mcg Oral QAC breakfast Curlene Labrum Readling, MD   100 mcg at 11/29/11 0605  . loperamide (IMODIUM) capsule 2-4 mg  2-4 mg Oral PRN Curlene Labrum Readling, MD      . LORazepam (ATIVAN) tablet 1 mg  1 mg Oral Q6H PRN Curlene Labrum Readling, MD   1 mg at 11/29/11 0012  . LORazepam (ATIVAN) tablet 1 mg  1 mg Oral Q8H Neil Mashburn, PA-C   1 mg at 11/29/11 1003  . LORazepam (ATIVAN) tablet 1 mg  1 mg Oral Q12H Verne Spurr, PA-C      .  LORazepam (ATIVAN) tablet 1 mg  1 mg Oral Daily Verne Spurr, PA-C      . lurasidone (LATUDA) tablet 40 mg  40 mg Oral Q breakfast Curlene Labrum Readling, MD   40 mg at 11/29/11 0747  . magnesium hydroxide (MILK OF MAGNESIA) suspension 30 mL  30 mL Oral Daily PRN Curlene Labrum Readling, MD      . mirtazapine (REMERON) tablet 7.5 mg  7.5 mg Oral QHS Sanjuana Kava, NP      . multivitamin with minerals tablet 1 tablet  1 tablet Oral Daily Ronny Bacon, MD   1 tablet at 11/29/11 0747  . ondansetron (ZOFRAN-ODT) disintegrating tablet 4 mg  4 mg Oral Q6H PRN Curlene Labrum Readling, MD      . PARoxetine (PAXIL) tablet 40 mg  40 mg Oral Daily Curlene Labrum Readling, MD   40 mg at 11/29/11 0747  . thiamine (VITAMIN B-1) tablet 100 mg  100 mg Oral Daily Curlene Labrum Readling, MD   100 mg at 11/29/11 0747  . vitamin B-12 (CYANOCOBALAMIN) tablet 100 mcg  100 mcg Oral Daily  Curlene Labrum Readling, MD   100 mcg at 11/29/11 0747  . DISCONTD: hydrOXYzine (ATARAX/VISTARIL) tablet 50 mg  50 mg Oral QHS,MR X 1 Mickie D. Adams, PA   50 mg at 11/28/11 2135  . DISCONTD: LORazepam (ATIVAN) tablet 1 mg  1 mg Oral Q6H Curlene Labrum Readling, MD   1 mg at 11/28/11 2137  . DISCONTD: LORazepam (ATIVAN) tablet 1 mg  1 mg Oral Q8H Randy D Readling, MD      . DISCONTD: LORazepam (ATIVAN) tablet 1 mg  1 mg Oral Q12H Curlene Labrum Readling, MD      . DISCONTD: LORazepam (ATIVAN) tablet 1 mg  1 mg Oral Daily Ronny Bacon, MD      . DISCONTD: traZODone (DESYREL) tablet 50 mg  50 mg Oral QHS PRN Verne Spurr, PA-C        Lab Results:  Results for orders placed during the hospital encounter of 11/27/11 (from the past 48 hour(s))  TSH     Status: Normal   Collection Time   11/28/11  6:00 AM      Component Value Range Comment   TSH 0.763  0.350 - 4.500 uIU/mL   T3, FREE     Status: Normal   Collection Time   11/28/11  6:00 AM      Component Value Range Comment   T3, Free 3.0  2.3 - 4.2 pg/mL   T4, FREE     Status: Normal   Collection Time   11/28/11  6:00 AM      Component Value Range Comment   Free T4 1.21  0.80 - 1.80 ng/dL     Physical Findings: AIMS: Facial and Oral Movements Muscles of Facial Expression: None, normal Lips and Perioral Area: None, normal Jaw: None, normal Tongue: None, normal,Extremity Movements Upper (arms, wrists, hands, fingers): None, normal Lower (legs, knees, ankles, toes): None, normal, Trunk Movements Neck, shoulders, hips: None, normal, Overall Severity Severity of abnormal movements (highest score from questions above): None, normal Incapacitation due to abnormal movements: None, normal Patient's awareness of abnormal movements (rate only patient's report): No Awareness, Dental Status Current problems with teeth and/or dentures?: No Does patient usually wear dentures?: No  CIWA:  CIWA-Ar Total: 0  COWS:     Treatment Plan Summary: Daily contact with  patient to assess and evaluate symptoms and progress in treatment Medication management  Plan: Discontinue Trazodone 50 mg,  Doxycycline 100 mg bid x 7 days for infection. Bacitracin oint to open sores to skin areas. Start Remeron 7.5 mg Q bedtime for sleep. Continue current treatment plan.   Armandina Stammer I 11/29/2011, 3:02 PM
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
## 2011-11-29 NOTE — Progress Notes (Signed)
[facility] Group Notes:  (Counselor/Nursing/MHT/Case Management/Adjunct)  11/29/2011 10:08 PM  Type of Therapy:  NA meeting  Participation Level:  Active  Participation Quality:  Appropriate  Affect:  Appropriate  Cognitive:  Appropriate  Insight:  Good  Engagement in Group:  Good  Engagement in Therapy:  Good  Modes of Intervention:  Support  Summary of Progress/Problems:   Edward [name] 11/29/2011, 10:08 PMThe focus of this group is to help patients review their daily goal of treatment and discuss progress on daily workbooks.

## 2011-11-29 NOTE — Progress Notes (Signed)
Patient ID: Edward [name], male   DOB: Sep 08, 1960, 51 y.o.   MRN: [id number]  D:  Pt was laying in bed when the writer approached him. Asked the pt how he felt his treatment was progress. Or if he had any questions or concerns. Pt stated "I'm a lil confused about who my social worker is. I don't wanna ask the techs all my questions". Informed the pt that all staff are here to support him. Writer told pt the names of his cm and counselor. Stated he just wants to know what comes next. Informed the writer that he wants to attend a 28 day program, then move to long term treatment.  A: Support and encouragement was offered. 15 min checks were continued.  R: Pt remains safe.

## 2011-11-29 NOTE — Progress Notes (Signed)
Psychoeducational Group Note  Date:  11/29/2011 Time:  1100  Group Topic/Focus:  Personal Choices and Values:   The focus of this group is to help patients assess and explore the importance of values in their lives, how their values affect their decisions, how they express their values and what opposes their expression.  Participation Level:  Did Not Attend   Additional Comments:  Pt did not attend group but remained in bed .  Sharyn Lull 11/29/2011, 11:37 AM
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
## 2011-11-29 NOTE — Progress Notes (Signed)
D: Pt on the hall way during this assessment; he reported that he slept for most part of the day. His mood and affect appropriate to situation. Although he endorsed passive SI, but verbally contracted for safety. Patient reported that he slept for most part of the day, even at when he tried to attend groups; "I was drowsy at the group". He denied HI and denied withdrawal symptoms. He reported that he had an antibiotic due at 2000. A: Patient encouraged to report to staff if suicide thoughts getting worse. Staff to check on patient in-between the 15 minute check to make sure he is during all right. Doxycycline scheduled for 2000 given as ordered. R:Pt receptive to encouragement. Received his 2000 medication without difficulty. Patient tolerated medication.

## 2011-11-29 NOTE — Progress Notes (Signed)
Pt attended discharge planning group and actively participated in group.  SW provided pt with today's workbook.  Pt presents with drowsy affect and depressed mood.  Pt rates depression and anxiety at a 9 today.  Pt denies SI.  Pt states that he had poor sleep last night and is very tired this morning.  SW discussed treatment options with pt.  [facility] is an option for inpatient treatment but doesn't have a bed available until September.  The only other option is [facility].  Pt reviewed the brochure and felt this would be a good treatment option for pt.  SW will refer pt to [facility] when pt is stable.  No further needs voiced by pt at this time.    Reyes Ivan, LCSWA 11/29/2011  10:15 AM

## 2011-11-30 MED ORDER — MIRTAZAPINE 15 MG PO TABS
15.0000 mg | ORAL_TABLET | Freq: Every day | ORAL | Status: DC
  Administered 2011-11-30: 15 mg via ORAL
  Filled 2011-11-30 (×3): qty 1

## 2011-11-30 MED ORDER — GABAPENTIN 600 MG PO TABS
300.0000 mg | ORAL_TABLET | Freq: Three times a day (TID) | ORAL | Status: DC
  Filled 2011-11-30 (×4): qty 0.5

## 2011-11-30 NOTE — Progress Notes (Signed)
Psychoeducational Group Note  Date:  11/30/2011 Time:  1100  Group Topic/Focus:  Overcoming Stress:   The focus of this group is to define stress and help patients assess their triggers.  Participation Level: Did Not Attend  Participation Quality:  Not Applicable  Affect:  Not Applicable  Cognitive:  Not Applicable  Insight:  Not Applicable  Engagement in Group: Not Applicable  Additional Comments:  Pt did not attend group but remained in bed. Staff attempted to wake pt for group but pt stated later that he did not receive a wake up call for group.   Sharyn Lull 11/30/2011, 1:57 PM

## 2011-11-30 NOTE — Progress Notes (Signed)
Pt attended discharge planning group and actively participated in group.  SW provided pt with today's workbook.  Pt presents with flat affect and depressed mood.  Pt rates depression at an 8 and anxiety at a 4-5 today.  Pt states that he has SI but no plan or intent.  Pt contracts for safety on the unit.  Pt states that he is also hearing voices that are just nagging.  Pt reports feeling better today.  Pt is still eager to go to [facility] when stable.  No further needs voiced by pt at this time.  Safety planning and suicide prevention discussed.  Pt participated in discussion and acknowledged an understanding of the information provided.       Edward [name], LCSWA 11/30/2011  10:27 AM

## 2011-11-30 NOTE — Progress Notes (Signed)
[facility] MD Progress Note  11/30/2011 3:17 PM  S/O: Patient seen and evaluated. Chart reviewed. Patient stated that his mood was "ok". His affect was mood congruent and euthymic. He denied any current thoughts of self injurious behavior, suicidal ideation or homicidal ideation. Nevertheless, did endorse episodic SI w/in past few days and prior AH that he "would be better off dead".  Was using alcohol, marijuana and cocaine. Endorsed poor sleep last night.  Anxiety better on current meds.  There were no auditory or visual hallucinations, paranoia, delusional thought processes, or mania noted today.  Thought process was linear and goal directed.  No psychomotor agitation or retardation was noted. His speech was normal rate, tone and volume. Eye contact was good. Judgment and insight are fair.  Patient has been up and engaged on the unit.  No acute safety concerns reported from team.  Sleep:  Number of Hours: 5.75    Vital Signs:Blood pressure 117/82, pulse 90, temperature 98.6 F (37 C), temperature source Oral, resp. rate 18, height 5' 8.5" (1.74 m), weight 78.019 kg (172 lb).  Current Medications:    . doxycycline  100 mg Oral Q12H  . feeding supplement  237 mL Oral BID BM  . gabapentin  600 mg Oral Daily  . levothyroxine  100 mcg Oral QAC breakfast  . LORazepam  1 mg Oral Q8H  . LORazepam  1 mg Oral Q12H  . LORazepam  1 mg Oral Daily  . lurasidone  40 mg Oral Q breakfast  . mirtazapine  7.5 mg Oral QHS  . multivitamin with minerals  1 tablet Oral Daily  . neomycin-bacitracin-polymyxin   Topical BID  . PARoxetine  40 mg Oral Daily  . thiamine  100 mg Oral Daily  . vitamin B-12  100 mcg Oral Daily    Lab Results: No results found for this or any previous visit (from the past 48 hour(s)).   A/P: Polysubstance Dependence (Alcohol, Cannabis & Cocaine); Alcohol W/D, resolving; Mood Disorder NOS; Insomnia  Pt open to increase in Remeron for sleep.  Will continue lurasidine and paroxetine for  reported anxiety, depressive and past AH.  Pt open to transitioning to [place] once stable.  Medication education completed.  Pros, cons, risks, potential side effects and benefits were discussed with pt.  Pt agreeable with the plan.  See orders.  Discussed with team.   Lupe Carney 11/30/2011, 3:17 PM

## 2011-11-30 NOTE — Progress Notes (Addendum)
Patient's self inventory form, pt has poor sleep, good appetite, low energy level, good attention span.   Rated depression #8, hopelessness #3.  Has felt agitation in past 24 hours.  SI off/on, contracts for safety.  Has experienced pain in past 24 hours.  Goal is to be pain free!  Worst pain #6.  "Hopefully I will like to go to [facility].  Need to see pediatrist for painful calluses on outsides of feet."  Does not have discharge plans.  Needs money to buy meds after discharge.  Patient stated he has felt SI, contracts for safety.   No plan.   Denied HI.   Stated he hears a woman's voices telling him he is better off dead.  Denied visual hallucinations.   Calluses on left and right feet that are painful at times.  Patient attended morning group.  Has been cooperative and pleasant this morning.   Went to dining room for breakfast. Patient stated he has been drinking too much alcohol and uses crack occasionally.  Left his girlfriend to be admitted to [facility].  Desires to get things straightened out with girlfriend but know he cannot be with his girlfriend if she will not change her substance abuse.  Wants to go to [facility] after discharge. Patient has been to afternoon group and to dining room for dinner meal.

## 2011-11-30 NOTE — Progress Notes (Signed)
11/30/2011         Time: 1500      Group Topic/Focus: The focus of this group is on enhancing the patient's understanding of leisure, barriers to leisure, and the importance of engaging in positive leisure activities upon discharge for improved total health.  Participation Level: Active  Participation Quality: Appropriate and Attentive  Affect: Appropriate  Cognitive: Oriented   Additional Comments: None.    Armida Vickroy 11/30/2011 3:51 PM

## 2011-11-30 NOTE — Progress Notes (Signed)
D:Patient has been very visible on the unit this evening. Mood and affect appropriate. He attended [place] and sang there. He said he spoke to somebody about sleep med during the day shift. Wanted to know if his dosage was increased. He denied SI/HI and denied Hallucination. A: Writer checked out patient's order. Notified patient about the increase in the dosage of Remeron. Encouraged and supported patient. R: Patient received his medications without difficulty, tolerated medication. Appreciative of care and voiced no complaints.

## 2011-11-30 NOTE — Tx Team (Signed)
Interdisciplinary Treatment Plan Update (Adult)  Date:  11/30/2011  Time Reviewed:  10:07 AM   Progress in Treatment: Attending groups: Yes Participating in groups:  Yes Taking medication as prescribed: Yes Tolerating medication:  Yes Family/Significant othe contact made:  Counselor assessing for appropriate contact Patient understands diagnosis:  Yes Discussing patient identified problems/goals with staff:  Yes Medical problems stabilized or resolved:  Yes Denies suicidal/homicidal ideation: Yes Issues/concerns per patient self-inventory:  None identified Other: N/A  New problem(s) identified: None Identified  Reason for Continuation of Hospitalization: Anxiety Depression Medication stabilization  Interventions implemented related to continuation of hospitalization: mood stabilization, medication monitoring and adjustment, group therapy and psycho education, safety checks q 15 mins  Additional comments: N/A  Estimated length of stay: 2-3 days  Discharge Plan: Pt requesting long term treatment, SW will refer pt to appropriate referrals.    New goal(s): Pt reports that he continues to hear voices.  Dr addressing this.    Review of initial/current patient goals per problem list:    1.  Goal(s): Address substance use  Met:  No  Target date: by discharge  As evidenced by: completing detox protocol and refer to appropriate treatment  2.  Goal (s): Reduce depressive and anxiety symptoms  Met:  No  Target date: by discharge  As evidenced by: Reducing depression from a 10 to a 3 as reported by pt.    3.  Goal(s): Eliminate SI  Met:  No  Target date: by discharge  As evidenced by: Pt denies SI   Attendees: Patient:  Edward [name]  11/30/2011 10:07 AM   Family:     Physician:  Lupe Carney, DO 11/30/2011 10:07 AM   Nursing: Quintella Reichert, RN 11/30/2011 10:07 AM   Case Manager:  Reyes Ivan, LCSWA 11/30/2011  10:07 AM   Counselor:  Ronda Fairly, LCSWA  11/30/2011  10:07 AM   Other:  Nestor Ramp, RN 11/30/2011 10:07 AM   Other:  Carney Living, RN 11/30/2011 10:07 AM   Other:  Demeitrus Watson, LCSWA 11/30/2011 10:07 AM   Other:      Scribe for Treatment Team:   Reyes Ivan 11/30/2011 10:07 AM

## 2011-11-30 NOTE — Progress Notes (Signed)
[facility] Group Notes:  (Counselor/Nursing/MHT/Case Management/Adjunct)  11/30/2011 4:48 PM  Type of Therapy:  Group Therapy at 1:15 to 2:30  Participation Level:  Active  Participation Quality:  Appropriate, Attentive and Sharing  Affect:  Flat  Cognitive:  Alert, Appropriate and Oriented  Insight:  Good  Engagement in Group:  Good  Engagement in Therapy:  Good  Modes of Intervention:  Activity, Clarification, Problem-solving, Role-play, Socialization and Support  Summary of Progress/Problems:  Elisha participated in activity in which patients choose photographs to represent what their life would look and feel like were it in balance and another for out of balance. He choose a photo of an organised silverware drawer to represent balance and a photo of two people moving together but not quiet connected  to represent out of balance. "This represents my relationship with girlfriend so well; it's like I know the way but she just won't step on it and catch up."  Patient also mentioned reason for his multiple relapses relate to substances girlfriend brings in the home earlier in group. Others in group challenged pt if he is really trying to save her or she is simply his excuse.   [name] 11/30/2011 4:54 PM

## 2011-12-01 MED ORDER — GABAPENTIN 300 MG PO CAPS
300.0000 mg | ORAL_CAPSULE | Freq: Three times a day (TID) | ORAL | Status: DC
  Administered 2011-12-01 (×2): 300 mg via ORAL
  Filled 2011-12-01 (×5): qty 1

## 2011-12-01 MED ORDER — LURASIDONE HCL 40 MG PO TABS: 40.0000 mg | ORAL_TABLET | Freq: Every day | ORAL | Status: DC

## 2011-12-01 MED ORDER — MIRTAZAPINE 15 MG PO TABS: 15.0000 mg | ORAL_TABLET | Freq: Every day | ORAL | Status: DC

## 2011-12-01 MED ORDER — PAROXETINE HCL 40 MG PO TABS: 40.0000 mg | ORAL_TABLET | Freq: Every day | ORAL | Status: DC

## 2011-12-01 MED ORDER — LEVOTHYROXINE SODIUM 100 MCG PO TABS: 100.0000 ug | ORAL_TABLET | Freq: Every day | ORAL | Status: DC

## 2011-12-01 MED ORDER — GABAPENTIN 300 MG PO CAPS: 300.0000 mg | ORAL_CAPSULE | Freq: Three times a day (TID) | ORAL | Status: DC

## 2011-12-01 MED ORDER — DOXYCYCLINE HYCLATE 100 MG PO TABS: 100.0000 mg | ORAL_TABLET | Freq: Two times a day (BID) | ORAL | Status: AC

## 2011-12-01 NOTE — Progress Notes (Signed)
[facility] Group Notes:  (Counselor/Nursing/MHT/Case Management/Adjunct)  12/01/2011 11:26 AM  Type of Therapy:  Psychoeducational Skills  Participation Level:  None  Participation Quality:  Resistant  Affect:  Flat  Cognitive:  Appropriate  Insight:  Limited  Engagement in Group:  None  Engagement in Therapy:  None  Modes of Intervention:  Education  Summary of Progress/Problems: Patient declined to participate in group although he attended. Patient did not engage in the "coping skills pictionary" activity.    Ardelle Park O 12/01/2011, 11:26 AM
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
## 2011-12-01 NOTE — Progress Notes (Signed)
Patient ID: Edward [name], male   DOB: 10-15-1960, 51 y.o.   MRN: [id number] Calm and cooperative during d/c process. Reviewed all d/c instructions and patient verbalized understanding of f/u and medications. Denies SI. Stated treatment goals were met. All belongings returned and escorted to lobby to care of [facility] staff.

## 2011-12-01 NOTE — Treatment Plan (Signed)
Interdisciplinary Treatment Plan Update (Adult)  Date: 12/01/2011  Time Reviewed: 11:41 AM   Progress in Treatment: Attending groups: Yes Participating in groups: Yes Taking medication as prescribed: Yes Tolerating medication: Yes   Family/Significant othe contact made:   Patient understands diagnosis:  Yes Discussing patient identified problems/goals with staff:  Yes Medical problems stabilized or resolved:  Yes Denies suicidal/homicidal ideation: Yes  In AM group Issues/concerns per patient self-inventory:  None noted Other:  New problem(s) identified: N/A  Reason for Continuation of Hospitalization: Other; describe D/C today  Interventions implemented related to continuation of hospitalization:   Additional comments:  Estimated length of stay:  Discharge Plan: Transfer to [facility]  New goal(s): N/A  Review of initial/current patient goals per problem list:   1.  Goal(s):Eliminate SI  Met:  Yes  Target date:8/9  As evidenced by: Self report   2.  Goal (s):Stabilize mood  Met:  Yes  Target date:8/9  As evidenced by: self report   3.  Goal(s):  Address substance abuse issues  Met:  Yes  Target date:8/9  As evidenced by: no withdrawal symptoms, admission to [facility]  4.  Goal(s):  Met:  Yes  Target date:  As evidenced by:  Attendees: Patient:     Family:     Physician:  Lupe Carney 12/01/2011 11:41 AM   Nursing:    12/01/2011 11:41 AM   Case Manager:  Richelle Ito, LCSW 12/01/2011 11:41 AM   Counselor:   12/01/2011 11:41 AM   Other:     Other:     Other:     Other:      Scribe for Treatment Team:   Ida Rogue, 12/01/2011 11:41 AM

## 2011-12-01 NOTE — Progress Notes (Signed)
[facility] Case Management Discharge Plan:  Will you be returning to the same living situation after discharge: No. At discharge, do you have transportation home?:Yes,  [facility] Do you have the ability to pay for your medications:Yes,  mental health  Interagency Information:     Release of information consent forms completed and in the chart;  Patient's signature needed at discharge.  Patient to Follow up at:  Follow-up Information    Follow up with [facility] on 12/01/2011. (They will pick you up today)    Contact information:   [address] [place], [place] [address] [phone]         Patient denies SI/HI:   Yes,  yes    Safety Planning and Suicide Prevention discussed:  Yes,  yes  Barrier to discharge identified:No.  Summary and Recommendations:   Edward [name] 12/01/2011, 11:38 AM

## 2011-12-01 NOTE — Progress Notes (Signed)
Psychoeducational Group Note  Date:  12/01/2011 Time:  1100  Group Topic/Focus:  Relapse Prevention Planning:   The focus of this group is to define relapse and discuss the need for planning to combat relapse.  Participation Level:  Did Not Attend  Additional Comments:  Pt did not attend group.   Dalia Heading 12/01/2011, 2:12 PM

## 2011-12-01 NOTE — Progress Notes (Signed)
[facility] Group Notes:  (Counselor/Nursing/MHT/Case Management/Adjunct)  11/30/2011 2200 Type of Therapy:  Karaoke  Participation Level:  Active  Participation Quality:  Appropriate, Attentive, Sharing and Supportive  Affect:  Appropriate  Cognitive:  Alert and Appropriate  Insight:  Good  Engagement in Group:  Good  Engagement in Therapy:  Good  Modes of Intervention:  Activity and Socialization  Summary of Progress/Problems:   Thayer Jew 12/01/2011, 1:34 AM

## 2011-12-04 NOTE — Progress Notes (Signed)
Patient Discharge Instructions:  After Visit Summary (AVS):   Faxed to:  12/04/2011 Psychiatric Admission Assessment Note:   Faxed to:  12/04/2011 Suicide Risk Assessment - Discharge Assessment:   Faxed to:  12/04/2011 Faxed/Sent to the Next Level Care provider:  12/04/2011  Faxed to [facility] @ [phone]  Wandra Scot, 12/04/2011, 4:38 PM

## 2011-12-17 NOTE — Discharge Summary (Signed)
Physician Discharge Summary Note  Patient:  Edward [name] is an 51 y.o., male MRN:  [id number] DOB:  1960/10/22 Patient phone:  [phone] (home)  Patient address:   [address] [place] [address]  Date of Admission:  11/27/2011  Reason for Admission: see H&P.  Principal Problem:  *Alcohol dependence Active Problems:  Cocaine abuse  Substance induced mood disorder  Discharge Diagnoses: Polysubstance Dependence (Alcohol, Cannabis & Cocaine); Alcohol W/D, resolved; Mood Disorder NOS; Insomnia   Past Medical History   Diagnosis  Date   .  Thyroid disease    .  Hepatitis    .  Neuromuscular disorder      pinched nerve      Level of Care:  [facility]  Hospital Course:  Pt admitted for crisis stabilization, detox and treatment. Pt attended all therapeutic groups, was active in his treatment planning process and agreed the current medication regimen for further stability during recovery. There were no acute issues during treatment and he was open to further substance abuse Tx.  All labs were reviewed in great detail and medical/psychiatric needs were addressed.  No acute safety issues were noted on the unit. Medications were reviewed with pt and medication education was provided. Mental health treatment, medication management and continued sobriety will mitigate against any potential increased risk of harm to self and/or others.  Discussed the importance of recovery with pt, as well as, tools to move forward in a healthy & safe manner using the 12 Step Process.    Consults: none.  Significant Diagnostic Studies: see labs.  Discharge Vitals:   Blood pressure 118/84, pulse 76, temperature 97.9 F (36.6 C), temperature source Oral, resp. rate 16, height 5' 8.5" (1.74 m), weight 78.019 kg (172 lb).  Mental Status Exam: See Mental Status Examination and Suicide Risk Assessment completed by Attending Physician prior to discharge.  Discharge destination:  [facility]  Is patient on multiple  antipsychotic therapies at discharge:  No   Has Patient had three or more failed trials of antipsychotic monotherapy by history:  No  Recommended Plan for Multiple Antipsychotic Therapies: NA.  Discharge Orders    Future Orders Please Complete By Expires   Diet - low sodium heart healthy        Medication List  As of 12/17/2011 11:31 PM   STOP taking these medications         gabapentin 600 MG tablet      ibuprofen 200 MG tablet      LORazepam 1 MG tablet      vitamin B-12 100 MCG tablet         TAKE these medications      Indication    gabapentin 300 MG capsule   Commonly known as: NEURONTIN   Take 1 capsule (300 mg total) by mouth 3 (three) times daily. For Pain       levothyroxine 100 MCG tablet   Commonly known as: SYNTHROID, LEVOTHROID   Take 1 tablet (100 mcg total) by mouth daily. For Hypothyroidism.       lurasidone 40 MG Tabs   Commonly known as: LATUDA   Take 1 tablet (40 mg total) by mouth daily with breakfast. For psychotic symptoms.       mirtazapine 15 MG tablet   Commonly known as: REMERON   Take 1 tablet (15 mg total) by mouth at bedtime. For insomnia.       PARoxetine 40 MG tablet   Commonly known as: PAXIL   Take 1  tablet (40 mg total) by mouth daily. For depression.            Follow-up Information    Follow up with [facility] on 12/01/2011. (They will pick you up today)    Contact information:   [address] [place], [place] [address] [phone]         Plan Of Care/Follow-up recommendations: Pt seen and evaluated. Chart reviewed. Pt stable for and requesting discharge to [facility]. Pt contracting for safety and does not currently meet [place] involuntary commitment criteria for continued hospitalization against his will. Mental health treatment, medication management and continued sobriety will mitigate against the potential increased risk of harm to self and/or others. Discussed the importance of recovery further with pt, as well as, tools to move  forward in a healthy & safe manner. Pt agreeable with the plan. Discussed with the team. Recommend follow up with AA/NA. Diet: Low Sodium, Heart Healthy. Activity: As tolerated.   Signed: Lupe Carney 12/17/2011, 11:31 PM

## 2012-03-24 ENCOUNTER — Encounter (HOSPITAL_COMMUNITY): Payer: Self-pay | Admitting: *Deleted

## 2012-03-24 ENCOUNTER — Emergency Department (HOSPITAL_COMMUNITY)
Admission: EM | Admit: 2012-03-24 | Discharge: 2012-03-24 | Disposition: A | Discharge status: Home / Self Care | Payer: Medicaid Other | Attending: Emergency Medicine | Admitting: Emergency Medicine

## 2012-03-24 ENCOUNTER — Inpatient Hospital Stay (HOSPITAL_COMMUNITY)
Admission: AD | Admit: 2012-03-24 | Discharge: 2012-04-09 | DRG: 897 | Disposition: A | Discharge status: Home / Self Care | Payer: Medicaid Other | Source: Other Acute Inpatient Hospital | Attending: Psychiatry | Admitting: Psychiatry

## 2012-03-24 ENCOUNTER — Encounter (HOSPITAL_COMMUNITY): Payer: Self-pay | Admitting: Emergency Medicine

## 2012-03-24 DIAGNOSIS — F329 Major depressive disorder, single episode, unspecified: Secondary | ICD-10-CM

## 2012-03-24 DIAGNOSIS — F1410 Cocaine abuse, uncomplicated: Secondary | ICD-10-CM

## 2012-03-24 DIAGNOSIS — F32A Depression, unspecified: Secondary | ICD-10-CM

## 2012-03-24 DIAGNOSIS — G4700 Insomnia, unspecified: Secondary | ICD-10-CM | POA: Diagnosis present

## 2012-03-24 DIAGNOSIS — Z8619 Personal history of other infectious and parasitic diseases: Secondary | ICD-10-CM | POA: Insufficient documentation

## 2012-03-24 DIAGNOSIS — F1020 Alcohol dependence, uncomplicated: Secondary | ICD-10-CM

## 2012-03-24 DIAGNOSIS — F17200 Nicotine dependence, unspecified, uncomplicated: Secondary | ICD-10-CM | POA: Insufficient documentation

## 2012-03-24 DIAGNOSIS — E039 Hypothyroidism, unspecified: Secondary | ICD-10-CM | POA: Diagnosis present

## 2012-03-24 DIAGNOSIS — J3489 Other specified disorders of nose and nasal sinuses: Secondary | ICD-10-CM | POA: Diagnosis present

## 2012-03-24 DIAGNOSIS — G589 Mononeuropathy, unspecified: Secondary | ICD-10-CM | POA: Diagnosis present

## 2012-03-24 DIAGNOSIS — Z8719 Personal history of other diseases of the digestive system: Secondary | ICD-10-CM | POA: Insufficient documentation

## 2012-03-24 DIAGNOSIS — F3289 Other specified depressive episodes: Secondary | ICD-10-CM | POA: Insufficient documentation

## 2012-03-24 DIAGNOSIS — Z79899 Other long term (current) drug therapy: Secondary | ICD-10-CM | POA: Insufficient documentation

## 2012-03-24 DIAGNOSIS — F419 Anxiety disorder, unspecified: Secondary | ICD-10-CM

## 2012-03-24 DIAGNOSIS — Z23 Encounter for immunization: Secondary | ICD-10-CM

## 2012-03-24 DIAGNOSIS — R45851 Suicidal ideations: Secondary | ICD-10-CM | POA: Insufficient documentation

## 2012-03-24 DIAGNOSIS — F1994 Other psychoactive substance use, unspecified with psychoactive substance-induced mood disorder: Principal | ICD-10-CM

## 2012-03-24 HISTORY — DX: Major depressive disorder, single episode, unspecified: F32.9

## 2012-03-24 HISTORY — DX: Hypothyroidism, unspecified: E03.9

## 2012-03-24 HISTORY — DX: Depression, unspecified: F32.A

## 2012-03-24 HISTORY — DX: Mental disorder, not otherwise specified: F99

## 2012-03-24 LAB — CBC
MCH: 29.5 pg (ref 26.0–34.0)
MCHC: 35 g/dL (ref 30.0–36.0)
Platelets: 218 10*3/uL (ref 150–400)

## 2012-03-24 LAB — COMPREHENSIVE METABOLIC PANEL
ALT: 214 U/L — ABNORMAL HIGH (ref 0–53)
AST: 149 U/L — ABNORMAL HIGH (ref 0–37)
Albumin: 3.9 g/dL (ref 3.5–5.2)
Calcium: 9 mg/dL (ref 8.4–10.5)
Sodium: 136 mEq/L (ref 135–145)
Total Protein: 8 g/dL (ref 6.0–8.3)

## 2012-03-24 LAB — RAPID URINE DRUG SCREEN, HOSP PERFORMED
Amphetamines: NOT DETECTED
Barbiturates: NOT DETECTED
Benzodiazepines: NOT DETECTED
Cocaine: POSITIVE — AB
Tetrahydrocannabinol: NOT DETECTED

## 2012-03-24 MED ORDER — GABAPENTIN 300 MG PO CAPS
600.0000 mg | ORAL_CAPSULE | Freq: Three times a day (TID) | ORAL | Status: DC
  Administered 2012-03-24: 600 mg via ORAL
  Filled 2012-03-24 (×3): qty 2

## 2012-03-24 MED ORDER — ACETAMINOPHEN 325 MG PO TABS: 650.0000 mg | ORAL_TABLET | ORAL | Status: DC | PRN

## 2012-03-24 MED ORDER — ALUM & MAG HYDROXIDE-SIMETH 200-200-20 MG/5ML PO SUSP: 30.0000 mL | ORAL | Status: DC | PRN

## 2012-03-24 MED ORDER — INFLUENZA VIRUS VACC SPLIT PF IM SUSP
0.5000 mL | INTRAMUSCULAR | Status: AC
  Administered 2012-03-25: 0.5 mL via INTRAMUSCULAR

## 2012-03-24 MED ORDER — NAPROXEN 500 MG PO TABS
500.0000 mg | ORAL_TABLET | Freq: Two times a day (BID) | ORAL | Status: DC | PRN
  Administered 2012-03-24 – 2012-04-09 (×22): 500 mg via ORAL
  Filled 2012-03-24 (×18): qty 1
  Filled 2012-03-24: qty 10
  Filled 2012-03-24 (×4): qty 1

## 2012-03-24 MED ORDER — NICOTINE 21 MG/24HR TD PT24: 21.0000 mg | MEDICATED_PATCH | Freq: Every day | TRANSDERMAL | Status: DC

## 2012-03-24 MED ORDER — TRAMADOL HCL 50 MG PO TABS
50.0000 mg | ORAL_TABLET | Freq: Four times a day (QID) | ORAL | Status: DC | PRN
  Administered 2012-03-24: 50 mg via ORAL
  Filled 2012-03-24: qty 2

## 2012-03-24 MED ORDER — MAGNESIUM HYDROXIDE 400 MG/5ML PO SUSP: 30.0000 mL | Freq: Every day | ORAL | Status: DC | PRN

## 2012-03-24 MED ORDER — ONDANSETRON HCL 4 MG PO TABS: 4.0000 mg | ORAL_TABLET | Freq: Three times a day (TID) | ORAL | Status: DC | PRN

## 2012-03-24 MED ORDER — VENLAFAXINE HCL ER 75 MG PO CP24
75.0000 mg | ORAL_CAPSULE | Freq: Every day | ORAL | Status: DC
  Filled 2012-03-24: qty 1

## 2012-03-24 MED ORDER — IBUPROFEN 600 MG PO TABS: 600.0000 mg | ORAL_TABLET | Freq: Three times a day (TID) | ORAL | Status: DC | PRN

## 2012-03-24 MED ORDER — LORAZEPAM 1 MG PO TABS
1.0000 mg | ORAL_TABLET | Freq: Three times a day (TID) | ORAL | Status: DC | PRN
  Administered 2012-03-24: 1 mg via ORAL
  Filled 2012-03-24: qty 1

## 2012-03-24 MED ORDER — LOPERAMIDE HCL 2 MG PO CAPS: 2.0000 mg | ORAL_CAPSULE | ORAL | Status: AC | PRN

## 2012-03-24 MED ORDER — CHLORDIAZEPOXIDE HCL 25 MG PO CAPS
25.0000 mg | ORAL_CAPSULE | Freq: Every day | ORAL | Status: AC
  Administered 2012-03-28: 25 mg via ORAL

## 2012-03-24 MED ORDER — ACETAMINOPHEN 325 MG PO TABS
650.0000 mg | ORAL_TABLET | Freq: Four times a day (QID) | ORAL | Status: DC | PRN
  Administered 2012-03-25 – 2012-04-05 (×14): 650 mg via ORAL

## 2012-03-24 MED ORDER — CHLORDIAZEPOXIDE HCL 25 MG PO CAPS
25.0000 mg | ORAL_CAPSULE | ORAL | Status: AC
  Administered 2012-03-26: 25 mg via ORAL
  Filled 2012-03-24 (×2): qty 1

## 2012-03-24 MED ORDER — CHLORDIAZEPOXIDE HCL 25 MG PO CAPS
25.0000 mg | ORAL_CAPSULE | Freq: Once | ORAL | Status: AC
  Administered 2012-03-24: 25 mg via ORAL
  Filled 2012-03-24: qty 1

## 2012-03-24 MED ORDER — HYDROXYZINE HCL 25 MG PO TABS
25.0000 mg | ORAL_TABLET | Freq: Four times a day (QID) | ORAL | Status: AC | PRN
  Administered 2012-03-27: 25 mg via ORAL

## 2012-03-24 MED ORDER — ZOLPIDEM TARTRATE 5 MG PO TABS: 5.0000 mg | ORAL_TABLET | Freq: Every evening | ORAL | Status: DC | PRN

## 2012-03-24 MED ORDER — MIRTAZAPINE 15 MG PO TABS
15.0000 mg | ORAL_TABLET | Freq: Every day | ORAL | Status: DC
  Administered 2012-03-24 – 2012-04-08 (×16): 15 mg via ORAL
  Filled 2012-03-24 (×19): qty 1

## 2012-03-24 MED ORDER — PNEUMOCOCCAL VAC POLYVALENT 25 MCG/0.5ML IJ INJ
0.5000 mL | INJECTION | INTRAMUSCULAR | Status: AC
  Administered 2012-03-25: 0.5 mL via INTRAMUSCULAR

## 2012-03-24 MED ORDER — TRIAMCINOLONE ACETONIDE 0.1 % EX CREA
1.0000 "application " | TOPICAL_CREAM | Freq: Two times a day (BID) | CUTANEOUS | Status: DC | PRN
  Filled 2012-03-24: qty 15

## 2012-03-24 MED ORDER — GABAPENTIN 300 MG PO CAPS
600.0000 mg | ORAL_CAPSULE | Freq: Three times a day (TID) | ORAL | Status: DC
  Administered 2012-03-24 – 2012-04-05 (×32): 600 mg via ORAL
  Administered 2012-04-06 (×2): 300 mg via ORAL
  Administered 2012-04-06 – 2012-04-09 (×8): 600 mg via ORAL
  Filled 2012-03-24 (×54): qty 2

## 2012-03-24 MED ORDER — MIRTAZAPINE 30 MG PO TABS: 15.0000 mg | ORAL_TABLET | Freq: Every day | ORAL | Status: DC

## 2012-03-24 MED ORDER — LEVOTHYROXINE SODIUM 100 MCG PO TABS
100.0000 ug | ORAL_TABLET | Freq: Every day | ORAL | Status: DC
  Administered 2012-03-25 – 2012-04-09 (×16): 100 ug via ORAL
  Filled 2012-03-24 (×6): qty 1
  Filled 2012-03-24: qty 2
  Filled 2012-03-24 (×11): qty 1

## 2012-03-24 MED ORDER — VITAMIN B-1 100 MG PO TABS
100.0000 mg | ORAL_TABLET | Freq: Every day | ORAL | Status: DC
  Administered 2012-03-25 – 2012-04-09 (×14): 100 mg via ORAL
  Filled 2012-03-24 (×17): qty 1

## 2012-03-24 MED ORDER — ADULT MULTIVITAMIN W/MINERALS CH
1.0000 | ORAL_TABLET | Freq: Every day | ORAL | Status: DC
  Administered 2012-03-24 – 2012-04-09 (×15): 1 via ORAL
  Filled 2012-03-24 (×19): qty 1

## 2012-03-24 MED ORDER — CHLORDIAZEPOXIDE HCL 25 MG PO CAPS
25.0000 mg | ORAL_CAPSULE | Freq: Four times a day (QID) | ORAL | Status: AC
  Administered 2012-03-24 – 2012-03-25 (×4): 25 mg via ORAL
  Filled 2012-03-24 (×4): qty 1

## 2012-03-24 MED ORDER — LEVOTHYROXINE SODIUM 100 MCG PO TABS: 100.0000 ug | ORAL_TABLET | Freq: Every day | ORAL | Status: DC

## 2012-03-24 MED ORDER — CHLORDIAZEPOXIDE HCL 25 MG PO CAPS: 25.0000 mg | ORAL_CAPSULE | Freq: Four times a day (QID) | ORAL | Status: AC | PRN

## 2012-03-24 MED ORDER — DESVENLAFAXINE SUCCINATE ER 50 MG PO TB24
50.0000 mg | ORAL_TABLET | Freq: Every day | ORAL | Status: DC
  Administered 2012-03-25 – 2012-03-27 (×3): 50 mg via ORAL
  Filled 2012-03-24 (×6): qty 1

## 2012-03-24 MED ORDER — THIAMINE HCL 100 MG/ML IJ SOLN
100.0000 mg | Freq: Once | INTRAMUSCULAR | Status: AC
  Administered 2012-03-24: 100 mg via INTRAMUSCULAR

## 2012-03-24 MED ORDER — CHLORDIAZEPOXIDE HCL 25 MG PO CAPS
25.0000 mg | ORAL_CAPSULE | Freq: Three times a day (TID) | ORAL | Status: AC
  Administered 2012-03-25 – 2012-03-26 (×3): 25 mg via ORAL
  Filled 2012-03-24 (×2): qty 1

## 2012-03-24 MED ORDER — ONDANSETRON 4 MG PO TBDP: 4.0000 mg | ORAL_TABLET | Freq: Four times a day (QID) | ORAL | Status: AC | PRN

## 2012-03-24 NOTE — ED Notes (Signed)
Pt is aware that he will be transferred to [facility] shortly

## 2012-03-24 NOTE — ED Notes (Signed)
Up to the bathroom 

## 2012-03-24 NOTE — ED Notes (Signed)
telepsych info faxed and called 

## 2012-03-24 NOTE — ED Notes (Signed)
ACT in w/ pt 

## 2012-03-24 NOTE — ED Notes (Addendum)
Pt's belongings -large duffle locked in activity room; 2 pt belongings bags, 1 back pk locked in locker # 37

## 2012-03-24 NOTE — ED Notes (Addendum)
Pt states that he has been feeling depressed/suicidal for a week and a half.  Last used marijuana about 3 weeks ago.  Used 3 g of crack cocaine last night.  Denies having any specific plan for suicide.

## 2012-03-24 NOTE — ED Notes (Signed)
Pt ambulatory to [facility] w/ NT and security, belongings sent w/ pt.

## 2012-03-24 NOTE — Progress Notes (Signed)
Pt admitted on voluntary basis, on admission pt presents as being depressed and endorsing constant suicidal thoughts and says that he just can not shake those thoughts. Pt able to contract for safety on the unit. Pt also endorses long history of substance abuse including alcohol and crack cocaine and states that his preference is crack cocaine. Pt stated recently that he was started on Pristiq but has not felt any better since starting. Pt does have pain in his right knee and does have a brace with him and spoke about he slipped in a hole that led to the torn meniscus. Pt spoke about wanting to go to long term treatment when he gets discharged. Pt was oriented to the unit and safety maintained.

## 2012-03-24 NOTE — ED Notes (Signed)
telepsych in progress 

## 2012-03-24 NOTE — ED Notes (Signed)
Up to the desk requesting something to help him relax

## 2012-03-24 NOTE — ED Notes (Signed)
Report called to [place], RN, back in psych.

## 2012-03-24 NOTE — Progress Notes (Signed)
[facility] Group Notes:  (Counselor/Nursing/MHT/Case Management/Adjunct)  03/24/2012 10:11 PM  Type of Therapy:  Psychoeducational Skills  Participation Level:  Active  Participation Quality:  Appropriate  Affect:  Blunted  Cognitive:  Appropriate  Insight:  Good  Engagement in Group:  Good  Engagement in Therapy:  Good  Modes of Intervention:  Education  Summary of Progress/Problems: The pt. Verbalized that he arrived late this afternoon. He showered since being admitted. He states that he has not slept in four days and that he used some cocaine yesterday. His goal for tomorrow is "to level out".    Hazle Coca S 03/24/2012, 10:11 PM
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
## 2012-03-24 NOTE — ED Provider Notes (Signed)
History     CSN: [id number]  Arrival date & time 03/24/12  0865   First MD Initiated Contact with Patient 03/24/12 [phone]      Chief Complaint  Patient presents with  . Medical Clearance  . Suicidal    (Consider location/radiation/quality/duration/timing/severity/associated sxs/prior treatment) HPI  51 year old male with history of depression presents for suicidal ideation.  Pt reports he has had thoughts about harming himself for months.   Has thoughts about hanging himself, or walk in front of fast moving traffic.  The thoughts were prevalence and he decided to come to ER to seek for help.  Has suicidal attempt with OD in the past.  Pt admits to abuse crack cocaine, last use was last night.  Admits to smoking $200 worth of crack last night.  He also reports he is without his thyroid medicine for 2  weeks.  Denies binge drinking or other rec drug use.  Denies HI, or hallucination.  No other complaints except for R knee pain in which he was diagnosed with a torn medial meniscus on MRI 3 weeks ago.  He will f/u with his doctor in [place].    Past Medical History  Diagnosis Date  . Thyroid disease   . Hepatitis   . Neuromuscular disorder     pinched nerve    No past surgical history on file.  No family history on file.  History  Substance Use Topics  . Smoking status: Current Every Day Smoker -- 0.2 packs/day for 7 years  . Smokeless tobacco: Not on file  . Alcohol Use: 14.4 oz/week    24 Cans of beer per week     Comment: 2)12 pks/day      Review of Systems  Constitutional: Negative for fever and appetite change.  HENT: Negative for neck pain.   Respiratory: Negative for chest tightness and shortness of breath.   Cardiovascular: Negative for chest pain.  Skin: Negative for rash and wound.  Neurological: Negative for numbness.    Allergies  Review of patient's allergies indicates no known allergies.  Home Medications   Current Outpatient Rx  Name  Route  Sig   Dispense  Refill  . DESVENLAFAXINE SUCCINATE ER 50 MG PO TB24   Oral   Take 50 mg by mouth daily.         Marland Kitchen GABAPENTIN 300 MG PO CAPS   Oral   Take 600 mg by mouth 3 (three) times daily. For Pain         . LEVOTHYROXINE SODIUM 100 MCG PO TABS   Oral   Take 1 tablet (100 mcg total) by mouth daily. For Hypothyroidism.   30 tablet   0   . MIRTAZAPINE 15 MG PO TABS   Oral   Take 15 mg by mouth at bedtime.         Marland Kitchen NAPROXEN 500 MG PO TABS   Oral   Take 500 mg by mouth 2 (two) times daily as needed. For pain         . TRAMADOL HCL 50 MG PO TABS   Oral   Take 50-100 mg by mouth every 6 (six) hours as needed. For pain         . TRIAMCINOLONE ACETONIDE 0.1 % EX CREA   Topical   Apply 1 application topically 2 (two) times daily as needed. For rash in affected area           There were no vitals taken for this visit.  Physical Exam  Nursing note and vitals reviewed. Constitutional: He is oriented to person, place, and time. He appears well-developed and well-nourished. No distress.  HENT:  Head: Normocephalic and atraumatic.  Mouth/Throat: Oropharynx is clear and moist.  Eyes: Conjunctivae normal are normal.  Neck: Normal range of motion. Neck supple.  Cardiovascular: Normal rate and regular rhythm.   Pulmonary/Chest: Effort normal and breath sounds normal. No respiratory distress. He exhibits no tenderness.  Abdominal: Soft. There is no tenderness.  Musculoskeletal: Normal range of motion. He exhibits tenderness (tenderness to medial aspect of R knee on palpation, no deformity or swelling noted.  Neg anterior/posterior drawer test.  Normal varus/valgus.  No skin changes).  Neurological: He is alert and oriented to person, place, and time.  Skin: Skin is warm.  Psychiatric: His speech is normal and behavior is normal. Thought content is not paranoid and not delusional. He exhibits a depressed mood. He expresses suicidal ideation. He expresses no homicidal ideation.         Poor eye contact    ED Course  Procedures (including critical care time)   Labs Reviewed  CBC  COMPREHENSIVE METABOLIC PANEL  ETHANOL  URINE RAPID DRUG SCREEN (HOSP PERFORMED)   No results found.   No diagnosis found.  Results for orders placed during the hospital encounter of 03/24/12  CBC      Component Value Range   WBC 6.4  4.0 - 10.5 K/uL   RBC 4.48  4.22 - 5.81 MIL/uL   Hemoglobin 13.2  13.0 - 17.0 g/dL   HCT 19.1 (*) 47.8 - 29.5 %   MCV 84.2  78.0 - 100.0 fL   MCH 29.5  26.0 - 34.0 pg   MCHC 35.0  30.0 - 36.0 g/dL   RDW 62.1  30.8 - 65.7 %   Platelets 218  150 - 400 K/uL  COMPREHENSIVE METABOLIC PANEL      Component Value Range   Sodium 136  135 - 145 mEq/L   Potassium 3.9  3.5 - 5.1 mEq/L   Chloride 101  96 - 112 mEq/L   CO2 23  19 - 32 mEq/L   Glucose, Bld 117 (*) 70 - 99 mg/dL   BUN 11  6 - 23 mg/dL   Creatinine, Ser 8.46  0.50 - 1.35 mg/dL   Calcium 9.0  8.4 - 96.2 mg/dL   Total Protein 8.0  6.0 - 8.3 g/dL   Albumin 3.9  3.5 - 5.2 g/dL   AST 952 (*) 0 - 37 U/L   ALT 214 (*) 0 - 53 U/L   Alkaline Phosphatase 99  39 - 117 U/L   Total Bilirubin 0.3  0.3 - 1.2 mg/dL   GFR calc non Af Amer >90  >90 mL/min   GFR calc Af Amer >90  >90 mL/min  ETHANOL      Component Value Range   Alcohol, Ethyl (B) 56 (*) 0 - 11 mg/dL  URINE RAPID DRUG SCREEN (HOSP PERFORMED)      Component Value Range   Opiates NONE DETECTED  NONE DETECTED   Cocaine POSITIVE (*) NONE DETECTED   Benzodiazepines NONE DETECTED  NONE DETECTED   Amphetamines NONE DETECTED  NONE DETECTED   Tetrahydrocannabinol NONE DETECTED  NONE DETECTED   Barbiturates NONE DETECTED  NONE DETECTED   No results found.  1. SI 2. Medical clearance 3. Drug abuse  MDM  Pt presents with SI, has attempted in the past.  No specific triggers except crack abuse, and  being off his thyroid meds for nearly 2 weeks.  He is currently in no acute distress.  Medical screening work up initiated.     10:52  AM Pt is medically cleared.  Pt has elevated liver enzyme, however denies any abdominal pain.  I have consulted with ACT who will evaluate pt.  My attending is aware of plan.  Psych holding order and med rec filled.       Fayrene Helper, PA-C 03/25/12 [phone]

## 2012-03-24 NOTE — ED Notes (Signed)
Per EMS: Depression/SI X 10 days.  PT admits to smoking $200 worth of crack last night.

## 2012-03-25 MED ORDER — NICOTINE 21 MG/24HR TD PT24
21.0000 mg | MEDICATED_PATCH | Freq: Every day | TRANSDERMAL | Status: DC
  Administered 2012-03-25 – 2012-04-09 (×11): 21 mg via TRANSDERMAL
  Filled 2012-03-25 (×17): qty 1

## 2012-03-25 NOTE — Clinical Social Work Note (Signed)
Discharge Planning Group 8:30-9:30 AM 03/25/2012  Patient seen during during d/c planning group and or treatment team.  He endorses SI and rates all symptoms at ten.  Patient denies HI.  He endorses drinking and using crack cocaine prior to admission. Patient stated he is currently homeless and has spent money intended for housing on ETOH/Drugs.  He is interested in residential treatment.  Patient shared he was in [facility] in July 2013 but did not complete programming.  Writer checked with [facility] and it is uncertain as to whether patient would be able to return.  [facility]'s admissions are two to three weeks out.    [facility] Group Notes:  (Counselor/Nursing/MHT/Case Management/Adjunct)        Overcoming Obstacles        03/25/2012   1:15   Type of Therapy:   Participation Level:  Patient did not attend group  Participation Quality:   Affect:    Cognitive:   Insight:    Engagement in Group:   Engagement in Therapy:    Modes of Intervention:    Summary of Progress/Problems: Patient did not attend group.    Wynn Banker 03/25/2012 12:33 PM

## 2012-03-25 NOTE — Progress Notes (Signed)
Patient ID: Edward [name], male   DOB: 1961/02/11, 51 y.o.   MRN: [id number] He Had been  for AM meal then  Went back to bed. He did get up for medication . Requested and received PRN  For rt pain (ongoing). Denies having withdrawal  Symptoms at this time.
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
## 2012-03-25 NOTE — Progress Notes (Signed)
Psychoeducational Group Note  Date:  03/25/2012 Time:  1100  Group Topic/Focus:  Self Care:   The focus of this group is to help patients understand the importance of self-care in order to improve or restore emotional, physical, spiritual, interpersonal, and financial health.  Participation Level:  Active  Participation Quality:  Appropriate and Attentive  Affect:  Appropriate  Cognitive:  Alert, Appropriate and Oriented  Insight:  Good  Engagement in Group:  Good  Additional Comments:  Pt. Active in group, shared strengths and areas of improvement. Pt. Finds it difficult to overcome some barriers due to depression making him isolative.   Ruta Hinds Reinbeck 03/25/2012, 2:09 PM

## 2012-03-25 NOTE — Tx Team (Signed)
Interdisciplinary Treatment Plan Update (Adult)  Date:  03/25/2012  Time Reviewed:  9:44 AM   Progress in Treatment: Attending groups:   Yes   Participating in groups:  Yes Taking medication as prescribed:  Yes Tolerating medication:  Yes Family/Significant othe contact made: Contact to be made with family Patient understands diagnosis:  Yes Discussing patient identified problems/goals with staff: Yes Medical problems stabilized or resolved: Yes Denies suicidal/homicidal ideation:Yes Issues/concerns per patient self-inventory:  Other:   New problem(s) identified:  Reason for Continuation of Hospitalization: Anxiety Depression Medication stabilization Suicidal ideation  Interventions implemented related to continuation of hospitalization:  Medication Management; safety checks q 15 mins  Additional comments:  Estimated length of stay:  3-5 days  Discharge Plan:  Home with outpatient follow up  New goal(s):  Review of initial/current patient goals per problem list:    1.  Goal(s): Eliminate SI/other thoughts of self harm   Met:  No  Target date: d/c  As evidenced by: Patient will no longer endorse SI/HI or other thoughts of self harm.    2.  Goal (s):Reduce depression/anxiety (rated at ten today)  Met: Yes  Target date: d/c  As evidenced by: Patient will rate symptoms at four or below    3.  Goal(s):.stabilize on meds   Met:  No  Target date: d/c  As evidenced by: Patient reports being stabilized on medications - less symptomatic    4.  Goal(s): Refer for outpatient follow up   Met:  No  Target date: d/c  As evidenced by: Follow up appointment will be scheduled    Attendees: Patient:   03/25/2012 9:44 AM  Physican:  Patrick North, MD 03/25/2012 9:44 AM  Nursing:  Neill Loft, RN 03/25/2012 9:44 AM   Nursing:   Quintella Reichert, RN 03/25/2012 9:44 AM   Clinical Social Worker:  Juline Patch, LCSW 03/25/2012 9:44 AM   Other: Serena Colonel,  FNP 03/25/2012 9:44 AM   Other:

## 2012-03-25 NOTE — Progress Notes (Signed)
Report received from [facility]. Writer met with patient and administered his  1900 medications before he prepared to attend group. Patient requested naproxen for rt knee pain. Patient attended group and reported that he had used crack cocaine on last night and has not been able to sleep. Patient reports that he want rehabilitation. Support and encouragement offered, safety maintained on unit with 15 min checks, will continue to monitor.

## 2012-03-26 DIAGNOSIS — F1994 Other psychoactive substance use, unspecified with psychoactive substance-induced mood disorder: Principal | ICD-10-CM

## 2012-03-26 DIAGNOSIS — F1410 Cocaine abuse, uncomplicated: Secondary | ICD-10-CM

## 2012-03-26 DIAGNOSIS — F1020 Alcohol dependence, uncomplicated: Secondary | ICD-10-CM

## 2012-03-26 MED ORDER — PSEUDOEPHEDRINE HCL 30 MG PO TABS
30.0000 mg | ORAL_TABLET | ORAL | Status: DC | PRN
  Administered 2012-03-26 – 2012-04-08 (×16): 30 mg via ORAL
  Filled 2012-03-26 (×16): qty 1

## 2012-03-26 NOTE — Progress Notes (Signed)
Psychoeducational Group Note  Date:  03/26/2012 Time:  1100  Group Topic/Focus:  Recovery Goals:   The focus of this group is to identify appropriate goals for recovery and establish a plan to achieve them.  Participation Level:  Did Not Attend    Meredith Staggers 03/26/2012, 1:08 PM

## 2012-03-26 NOTE — Progress Notes (Signed)
Patient ID: Edward [name], male   DOB: [birth year], 51 y.o.   MRN: [id number] D- Patient says he is having chills and he aches all over.  He feels like he is getting the flu.  Temp is normal.  A-Patient given tylenol for aching all over.  R- Patient returned to bed and has been sleeping .  Offered patient heat pack for aches. He declined.

## 2012-03-26 NOTE — ED Provider Notes (Signed)
Medical screening examination/treatment/procedure(s) were performed by non-physician practitioner and as supervising physician I was immediately available for consultation/collaboration.  Saskia Simerson R. Elverda Wendel, MD 03/26/12 0014 

## 2012-03-26 NOTE — Progress Notes (Signed)
Met with pt as he was going to AA group.  He reports he is doing a little better this evening. He offered little in conversation.  He says SI comes/goes.  He contracts for safety.  He denies HI/AV.  He says he spent much of the day in bed asleep.  He walks with a limp and says he wears a brace on his knee.  Pt was encouraged to make his needs known to staff.  Pt voiced understanding.  Safety maintained with q15 minute checks.

## 2012-03-26 NOTE — Progress Notes (Signed)
Adult Psychosocial Assessment Update Interdisciplinary Team  [facility] admissions[facility]discharges:  Admissions Discharges  Date: 11/27/11 Date:  12/01/11  Date: Date:  Date: Date:  Date: Date:  Date: Date:   Changes since the last Psychosocial Assessment (including adherence to outpatient mental health and/or substance abuse treatment, situational issues contributing to decompensation and/or relapse).   Patient reports being homeless and relapsing on alcohol and crack cocaine.    He reports using his monthly check to buy drugs and did not pay rent on his   Apartment.  He also stated he had gone to [facility] in July but left before completing   treatment       Discharge Plan 1. Will you be returning to the same living situation after discharge?   Yes: No:      If no, what is your plan?    Patient advised he had been living at the shelter in [place] but does not plan to return   There at discharge.  He is uncertain where he will live at discharge.     2. Would you like a referral for services when you are discharged? Yes:     If yes, for what services?  No:       Patient has asked for a referral for residential treatment.  A referral has been made to  [facility].  An admission assessment is scheduled for   Monday, April 01, 2012.   Summary and Recommendations (to be completed by the evaluator) Edward [name] is a 51 years old Caucasian male admitted with Bipolar Disorder and  Polysubstance abuse.  He will benefit from crisis stabilization, evaluation for medication, psycho-education groups for coping skills development, group therapy and case management for discharge planning.                      Signature:  Wynn Banker, 03/26/2012 12:10 PM

## 2012-03-26 NOTE — Progress Notes (Signed)
Patient ID: Edward [name], male   DOB: 1961-03-28, 51 y.o.   MRN: [id number] D: Pt. In bed, eyes closed, resp. Even. A: Staff will monitor q25min for safety. R: Pt. Is safe on the unit.

## 2012-03-26 NOTE — Clinical Social Work Note (Signed)
Discharge Planning Group 8:30-9:30 AM  03/26/2012   Patient did not attend discharge planning gorup.    [facility] Group Notes:  (Counselor/Nursing/MHT/Case Management/Adjunct)       Feelings About Diagnosis       03/26/2012   1:15  Type of Therapy: Group  Participation Level:  Patient did not attend group  Participation Quality:    Affect:    Cognitive:   Insight:    Engagement in Group:   Engagement in Therapy:    Modes of Intervention:    Summary of Progress/Problems:  Patient did not attend group   Wynn Banker 03/26/2012 2:54 PM

## 2012-03-26 NOTE — Progress Notes (Signed)
[facility] MD Progress Note  03/26/2012 12:07 PM Edward [name]  MRN:  [id number]  S: "I have a bad cold. I can hardly breath. My mood is terrible. Nothing has changed. I still feel suicidal, just don't have any plans to hurt me".  O: ROS: Negative for fever.  HENT: Positive for congestion and rhinorrhea.  Respiratory: Negative for cough, chest tightness and shortness of breath.  Cardiovascular: Negative for chest pain.  Gastrointestinal: Negative for nausea, vomiting and abdominal pain.  Skin: Negative for rash.  Neurological: Negative for weakness and headaches. Psychiatric: "My mood is terrible, I still feel suicidal"  VS: T 98.6, P 96, R 18, BP 122/82  Diagnosis:   Axis I: Substance Induced Mood Disorder and Alcohol dependence, Cocaine abuse Axis II: Deferred Axis III:  Past Medical History  Diagnosis Date  . Thyroid disease   . Hepatitis   . Neuromuscular disorder     pinched nerve  . Hypothyroidism   . Mental disorder   . Depression    Axis IV: Substance abuse/dependency Axis V: 41-50 serious symptoms  ADL's:  Intact  Sleep: Good  Appetite:  Good  Suicidal Ideation: "yes" Plan:  No Intent:  No Means:  No Homicidal Ideation: "No" Plan:  No Intent:  no Means:  no  AEB (as evidenced by): per patient's reports.  Mental Status Examination/Evaluation: Objective:  Appearance: Casual  Eye Contact::  Fair  Speech:  Clear and Coherent  Volume:  Normal  Mood:  "I am feel terrible, I have a bad cold"  Affect:  Blunt and Flat  Thought Process:  Coherent  Orientation:  Full  Thought Content:  Rumination  Suicidal Thoughts:  Yes.  without intent/plan  Homicidal Thoughts:  No  Memory:  Immediate;   Good Recent;   Good Remote;   Good  Judgement:  Fair  Insight:  Fair  Psychomotor Activity:  Mild tremors  Concentration:  Fair  Recall:  Good  Akathisia:  No  Handed:  Right  AIMS (if indicated):     Assets:  Desire for Improvement  Sleep:  Number of Hours: 6.75     Vital Signs:Blood pressure 124/82, pulse 80, temperature 98.2 F (36.8 C), temperature source Oral, resp. rate 16, height 5' 7.5" (1.715 m), weight 78.926 kg (174 lb). Current Medications: Current Facility-Administered Medications  Medication Dose Route Frequency Provider Last Rate Last Dose  . acetaminophen (TYLENOL) tablet 650 mg  650 mg Oral Q6H PRN Shuvon Rankin, NP   650 mg at 03/26/12 0817  . alum & mag hydroxide-simeth (MAALOX/MYLANTA) 200-200-20 MG/5ML suspension 30 mL  30 mL Oral Q4H PRN Shuvon Rankin, NP      . chlordiazePOXIDE (LIBRIUM) capsule 25 mg  25 mg Oral Q6H PRN Shuvon Rankin, NP      . [COMPLETED] chlordiazePOXIDE (LIBRIUM) capsule 25 mg  25 mg Oral QID Shuvon Rankin, NP   25 mg at 03/25/12 1601   Followed by  . [COMPLETED] chlordiazePOXIDE (LIBRIUM) capsule 25 mg  25 mg Oral TID Shuvon Rankin, NP   25 mg at 03/26/12 1152   Followed by  . chlordiazePOXIDE (LIBRIUM) capsule 25 mg  25 mg Oral BH-qamhs Shuvon Rankin, NP       Followed by  . chlordiazePOXIDE (LIBRIUM) capsule 25 mg  25 mg Oral Daily Shuvon Rankin, NP      . desvenlafaxine (PRISTIQ) 24 hr tablet 50 mg  50 mg Oral Daily Shuvon Rankin, NP   50 mg at 03/26/12 0818  . gabapentin (NEURONTIN) capsule  600 mg  600 mg Oral TID Shuvon Rankin, NP   600 mg at 03/26/12 1152  . hydrOXYzine (ATARAX/VISTARIL) tablet 25 mg  25 mg Oral Q6H PRN Shuvon Rankin, NP      . levothyroxine (SYNTHROID, LEVOTHROID) tablet 100 mcg  100 mcg Oral Q0600 Shuvon Rankin, NP   100 mcg at 03/26/12 0727  . loperamide (IMODIUM) capsule 2-4 mg  2-4 mg Oral PRN Shuvon Rankin, NP      . magnesium hydroxide (MILK OF MAGNESIA) suspension 30 mL  30 mL Oral Daily PRN Shuvon Rankin, NP      . mirtazapine (REMERON) tablet 15 mg  15 mg Oral QHS Shuvon Rankin, NP   15 mg at 03/25/12 2213  . multivitamin with minerals tablet 1 tablet  1 tablet Oral Daily Shuvon Rankin, NP   1 tablet at 03/26/12 0819  . naproxen (NAPROSYN) tablet 500 mg  500 mg Oral BID PRN  Shuvon Rankin, NP   500 mg at 03/26/12 0820  . nicotine (NICODERM CQ - dosed in mg/24 hours) patch 21 mg  21 mg Transdermal Daily Rachael Fee, MD   21 mg at 03/25/12 1558  . ondansetron (ZOFRAN-ODT) disintegrating tablet 4 mg  4 mg Oral Q6H PRN Shuvon Rankin, NP      . thiamine (VITAMIN B-1) tablet 100 mg  100 mg Oral Daily Shuvon Rankin, NP   100 mg at 03/26/12 0818    Lab Results: No results found for this or any previous visit (from the past 48 hour(s)).  Physical Findings: AIMS: Facial and Oral Movements Muscles of Facial Expression:  (UTA, Pt, in bed eyes closed, no distress noted), ,  ,  ,    CIWA:  CIWA-Ar Total: 3  COWS:     Treatment Plan Summary: Daily contact with patient to assess and evaluate symptoms and progress in treatment Medication management  Plan: Start Sudafed 30 mg Q 4 hours PRN for congestion. Continue current treatment plan.  Armandina Stammer I 03/26/2012, 12:07 PM

## 2012-03-27 MED ORDER — DESVENLAFAXINE SUCCINATE ER 100 MG PO TB24
100.0000 mg | ORAL_TABLET | Freq: Every day | ORAL | Status: DC
  Administered 2012-03-28 – 2012-03-29 (×2): 100 mg via ORAL
  Filled 2012-03-27 (×4): qty 1

## 2012-03-27 NOTE — Progress Notes (Signed)
1:1 Note Patient placed on 1:1 observation for safety. Patient verbalizes SI, patient cannot contract for safety at this time. Patient refusing medications, refusing to attend groups or meals. Patient states "I would be better off dead." Patient denies any questions/concerns at this time. Patient states "I don't want anything except a .38 and one round." Patient educated to rationale for 1:1 observation, patient verbalizes understanding. Patient safe on unit with Q15 minute checks for safety.  MHT at bedside, patient appears in no distress at this time. Will continue to monitor.  

## 2012-03-27 NOTE — Progress Notes (Signed)
Patient ID: Edward [name], male   DOB: 16-Jun-1960, 51 y.o.   MRN: [id number] [facility] MD Progress Note  03/27/2012 2:57 PM Edward [name]  MRN:  [id number]  S: "I feel like shit. I got nothing, no hope, no body. I need a revolver with 8 rounds in it. I am suicidal, don't know what I will do if I'm alone. I had attempted suicide in the past".  O: ROS: Negative for fever.  HENT: Positive for chest congestion, cough and rhinorrhea.  Respiratory: Negative for chest tightness and shortness of breath.  Cardiovascular: Negative for chest pain.  Gastrointestinal: Negative for nausea, vomiting and abdominal pain.  Skin: Negative for rash.  Neurological: Negative for weakness and headaches. Psychiatric: "I feel like shit, I still feel suicidal"   Diagnosis:   Axis I: Substance Induced Mood Disorder and Alcohol dependence, Cocaine abuse Axis II: Deferred Axis III:  Past Medical History  Diagnosis Date  . Thyroid disease   . Hepatitis   . Neuromuscular disorder     pinched nerve  . Hypothyroidism   . Mental disorder   . Depression    Axis IV: Substance abuse/dependency Axis V: 41-50 serious symptoms  ADL's:  Intact  Sleep: Good  Appetite:  Good  Suicidal Ideation: "yes" Plan:  No Intent:  No Means:  No Homicidal Ideation: "No" Plan:  No Intent:  no Means:  no  AEB (as evidenced by): per patient's reports.  Mental Status Examination/Evaluation: Objective:  Appearance: Casual  Eye Contact::  Fair  Speech:  Clear and Coherent  Volume:  Normal  Mood:  "I feel like shit"  Affect:  Blunt and Flat  Thought Process:  Coherent  Orientation:  Full  Thought Content:  Rumination  Suicidal Thoughts:  Yes.  without intent/plan  Homicidal Thoughts:  No  Memory:  Immediate;   Good Recent;   Good Remote;   Good  Judgement:  Fair  Insight:  Fair  Psychomotor Activity:  No tremors present  Concentration:  Fair  Recall:  Good  Akathisia:  No  Handed:  Right  AIMS (if indicated):      Assets:  Desire for Improvement  Sleep:  Number of Hours: 6.5    Vital Signs:Blood pressure 132/72, pulse 52, temperature 98.2 F (36.8 C), temperature source Oral, resp. rate 18, height 5' 7.5" (1.715 m), weight 78.926 kg (174 lb). Current Medications: Current Facility-Administered Medications  Medication Dose Route Frequency Provider Last Rate Last Dose  . acetaminophen (TYLENOL) tablet 650 mg  650 mg Oral Q6H PRN Shuvon Rankin, NP   650 mg at 03/26/12 1559  . alum & mag hydroxide-simeth (MAALOX/MYLANTA) 200-200-20 MG/5ML suspension 30 mL  30 mL Oral Q4H PRN Shuvon Rankin, NP      . chlordiazePOXIDE (LIBRIUM) capsule 25 mg  25 mg Oral Q6H PRN Shuvon Rankin, NP      . chlordiazePOXIDE (LIBRIUM) capsule 25 mg  25 mg Oral BH-qamhs Shuvon Rankin, NP   25 mg at 03/26/12 2136   Followed by  . chlordiazePOXIDE (LIBRIUM) capsule 25 mg  25 mg Oral Daily Shuvon Rankin, NP      . desvenlafaxine (PRISTIQ) 24 hr tablet 50 mg  50 mg Oral Daily Shuvon Rankin, NP   50 mg at 03/27/12 1326  . gabapentin (NEURONTIN) capsule 600 mg  600 mg Oral TID Shuvon Rankin, NP   600 mg at 03/26/12 1657  . hydrOXYzine (ATARAX/VISTARIL) tablet 25 mg  25 mg Oral Q6H PRN Shuvon Rankin, NP      .  levothyroxine (SYNTHROID, LEVOTHROID) tablet 100 mcg  100 mcg Oral Q0600 Shuvon Rankin, NP   100 mcg at 03/27/12 1610  . loperamide (IMODIUM) capsule 2-4 mg  2-4 mg Oral PRN Shuvon Rankin, NP      . magnesium hydroxide (MILK OF MAGNESIA) suspension 30 mL  30 mL Oral Daily PRN Shuvon Rankin, NP      . mirtazapine (REMERON) tablet 15 mg  15 mg Oral QHS Shuvon Rankin, NP   15 mg at 03/26/12 2136  . multivitamin with minerals tablet 1 tablet  1 tablet Oral Daily Shuvon Rankin, NP   1 tablet at 03/26/12 0819  . naproxen (NAPROSYN) tablet 500 mg  500 mg Oral BID PRN Shuvon Rankin, NP   500 mg at 03/27/12 1326  . nicotine (NICODERM CQ - dosed in mg/24 hours) patch 21 mg  21 mg Transdermal Daily Rachael Fee, MD   21 mg at 03/25/12 1558   . ondansetron (ZOFRAN-ODT) disintegrating tablet 4 mg  4 mg Oral Q6H PRN Shuvon Rankin, NP      . pseudoephedrine (SUDAFED) tablet 30 mg  30 mg Oral Q4H PRN Sanjuana Kava, NP   30 mg at 03/26/12 1558  . thiamine (VITAMIN B-1) tablet 100 mg  100 mg Oral Daily Shuvon Rankin, NP   100 mg at 03/26/12 9604    Lab Results: No results found for this or any previous visit (from the past 48 hour(s)).  Physical Findings: AIMS: Facial and Oral Movements Muscles of Facial Expression: None, normal Lips and Perioral Area: None, normal Jaw: None, normal Tongue: None, normal,Extremity Movements Upper (arms, wrists, hands, fingers): None, normal Lower (legs, knees, ankles, toes): None, normal, Trunk Movements Neck, shoulders, hips: None, normal, Overall Severity Severity of abnormal movements (highest score from questions above): None, normal Incapacitation due to abnormal movements: None, normal Patient's awareness of abnormal movements (rate only patient's report): No Awareness,    CIWA:  CIWA-Ar Total: 0  COWS:     Treatment Plan Summary: Daily contact with patient to assess and evaluate symptoms and progress in treatment Medication management  Plan: Increase Pristiq to 100 mg daily. Consider 1:1 supervision for safety Continue current treatment plan.  Armandina Stammer I 03/27/2012, 2:57 PM

## 2012-03-27 NOTE — Clinical Social Work Note (Signed)
Discharge Planning Group 8:30-9:30 AM  03/27/2012  Patient seen during during d/c planning group and or treatment team.  Patient stated during group that he does and care and wants to "blow his brains out."   He states he is unable to contract for safety.  He told MD during treatment team the only thing that can be done for him is "one round in a 33."  Patient rates all symptoms at ten. He     [facility] Group Notes:  (Counselor/Nursing/MHT/Case Management/Adjunct)       Emotional Regulation 1:15 - 2:30 PM         03/27/2012   1:15   Type of Therapy: Group  Participation Level:  Active  Participation Quality:  Attentive  Affect:  Appropriate  Cognitive:  Alert and Appropriate  Insight:  Good  Engagement in Group:  Good  Engagement in Therapy:  Good  Modes of Intervention:  Education, Problem-solving, Support and Exploration  Summary of Progress/Problems: Patient did not attend group.   Wynn Banker 03/27/2012 9:50 AM

## 2012-03-27 NOTE — Progress Notes (Signed)
Psychoeducational Group Note  Date:  03/27/2012 Time: 2000  Group Topic/Focus:  Wrap-Up Group:   The focus of this group is to help patients review their daily goal of treatment and discuss progress on daily workbooks.  Participation Level:  Active  Participation Quality:  Sharing  Affect:  Blunted  Cognitive:  Appropriate  Insight:  Lacking  Engagement in Group:  Limited  Additional Comments:  Patient shared that he was "pissed off with" himself for agreeing to an influenza vaccine.  Akaylah Lalley, Newton Pigg 03/27/2012, 9:40 PM

## 2012-03-27 NOTE — Progress Notes (Signed)
1:1 Note Patient appears somewhat brighter after dinner. Patient did wash up and change clothes, patient up in day room for evening meal. Patient in day room, MHT at patient's side. Patient verbalizes no questions/concerns at this time. Patient appears in no distress at this time. Patient verbalizes "I hope I haven't ruined my chances to go to rehab after this." Patient given support and encouragement, patient educated that [facility] will attempt to place patient in rehabilitation if that is his request. Patient remains safe on unit with Q15 minutes checks for safety. Will continue to monitor.

## 2012-03-27 NOTE — Progress Notes (Signed)
[facility] Group Notes:  (Counselor/Nursing/MHT/Case Management/Adjunct)  03/27/2012 2:45 PM  Type of Therapy:  Psychoeducational Skills  Participation Level:  Did Not Attend  Summary of Progress/Problems: Arnaldo did not attend psychoeducation group that focused on using quality time with support systems/individuals to engage in healthy coping skills.     Wandra Scot 03/27/2012, 2:45 PM

## 2012-03-27 NOTE — Tx Team (Addendum)
Interdisciplinary Treatment Plan Update (Adult)  Date:  03/27/2012  Time Reviewed:  9:37 AM   Progress in Treatment: Attending groups:   Yes   Participating in groups:  Yes Taking medication as prescribed:  Yes Tolerating medication:  Yes Family/Significant othe contact made: Contact to be made with family Patient understands diagnosis:  Yes Discussing patient identified problems/goals with staff: Yes Medical problems stabilized or resolved: Yes Denies suicidal/homicidal ideation: No.  Patient is unable to contract for safety Issues/concerns per patient self-inventory:  Other:   New problem(s) identified:  Reason for Continuation of Hospitalization: Anxiety Depression Medication stabilization Suicidal ideation  Interventions implemented related to continuation of hospitalization:  Medication Management; safety checks q 15 mins  Additional comments:  Estimated length of stay:  3-5 days  Discharge Plan:  Referral made to [facility]  New goal(s):  Review of initial/current patient goals per problem list:    1.  Goal(s): Eliminate SI/other thoughts of self harm (Patient states he wants to blow his brains out-  He is unable to    contract for safety   Met:  No  Target date: d/c  As evidenced by: Patient will no longer endorse SI/HI or other thoughts of self harm.    2.  Goal (s):Reduce depression/anxiety (Rated at ten today  Met: Yes  Target date: d/c  As evidenced by: Patient will rate symptoms at four or below    3.  Goal(s):.stabilize on meds   Met:  No  Target date: d/c  As evidenced by: Patient reports being stabilized on medications - less symptomatic    4.  Goal(s): Refer for outpatient follow up   Met: Yes  Target date: d/c  As evidenced by: Follow up appointment scheduled    Attendees: Patient:  Edward [name] 03/27/2012 9:37 AM  Physican:  Patrick North, MD 03/27/2012 9:37 AM  Nursing:   Berneice Heinrich, RN 03/27/2012 9:37 AM    Nursing:   Nestor Ramp, RN 03/27/2012 9:37 AM   Clinical Social Worker:  Juline Patch, LCSW 03/27/2012 9:37 AM   Other: Norval Gable, FNP 03/27/2012 9:37 AM   Other: Teddy Spike, MSW Intern     03/27/2012 9:46 AM Other: Patton Salles, LCSW Clinical Social Worker  03/27/2012 9:37 AM Other:  Tamala Fothergill     03/27/2012 9:46 AM

## 2012-03-27 NOTE — Progress Notes (Signed)
D: Patient in dayroom on approach.  Patient appears sad and depressed.  Patient states he had a bad day because he went off during treatment team.  Patient states he may have messed up his chances to going to [facility].   Patient rates depression 10/10 and states anxiety is not so bad.  Patient states he is having suicidal ideations but cannot contract for safety at this time.  Patient denies HI and denies AVH.  Patient states he need to learn to keep his mouth shut. Patient on 1:1 observation for safety A: Staff to monitor Q 15 mins for safety.  Encouragement and support offered.  Scheduled medications administered per orders. R: Patient remains safe on the unit.  Patient calm and cooperative.  Patient taking administered medicaations.  1:1 nursing notes on paper progress notes at bedside

## 2012-03-27 NOTE — Progress Notes (Signed)
1:1 Note Patient remains resting quietly in bed, MHT at bedside. Patient 1:1 observation for safety. Patient respirations even and unlabored. Patient appears asleep, no distress noted at this time. Patient safe on unit with Q15 minute checks for safety. Will continue to monitor.

## 2012-03-28 MED ORDER — RISPERIDONE 0.5 MG PO TABS
0.5000 mg | ORAL_TABLET | Freq: Every day | ORAL | Status: DC
  Administered 2012-03-28 – 2012-03-31 (×4): 0.5 mg via ORAL
  Filled 2012-03-28 (×5): qty 1

## 2012-03-28 MED ORDER — RISPERIDONE 0.5 MG PO TABS
0.5000 mg | ORAL_TABLET | Freq: Once | ORAL | Status: DC
  Filled 2012-03-28: qty 1

## 2012-03-28 NOTE — Progress Notes (Signed)
Psychoeducational Group Note  Date:  03/28/2012 Time:  1000  Group Topic/Focus:  Making Healthy Choices:   The focus of this group is to help patients identify negative/unhealthy choices they were using prior to admission and identify positive/healthier coping strategies to replace them upon discharge.  Participation Level: Did Not Attend  Participation Quality:  Not Applicable  Affect:  Not Applicable  Cognitive:  Not Applicable  Insight:  Not Applicable  Engagement in Group: Not Applicable  Additional Comments:    Edward [name] 03/28/2012, 12:07 PM

## 2012-03-28 NOTE — Progress Notes (Signed)
Psychoeducational Group Note  Date:  03/28/2012 Time:  1100  Group Topic/Focus:  Overcoming Stress:   The focus of this group is to define stress and help patients assess their triggers.  Participation Level:  None  Participation Quality:  Did not share  Affect:  Defensive, Not Congruent and Did not share  Cognitive:  Did not share  Insight:  Did not share  Engagement in Group:  None  Additional Comments:  Patient was asked to work with a partner in group setting to complete overcoming stress interview within the workbook packet. Patient stated he did not want to participate and would listen. Patient had no insight and did not share during the group time.  Karleen Hampshire Brittini 03/28/2012, 1:09 PM

## 2012-03-28 NOTE — Progress Notes (Signed)
Patient 1:1 observation discontinued at 1230. Patient verbalized understanding of updated orders, verbalized understanding. "Post 1:1 Observation Documentation Notes" form completed and placed in patient chart. Will continue to monitor.

## 2012-03-28 NOTE — Progress Notes (Signed)
Psychoeducational Group Note  Date:  03/28/2012 Time:  2000  Group Topic/Focus:  Karaoke   Participation Level:  Active  Participation Quality:  Appropriate  Affect:  Appropriate  Cognitive:  Appropriate  Insight:  Engaged  Engagement in Group:  Engaged  Additional Comments:    Edward [name] A 03/28/2012, 10:11 PM

## 2012-03-28 NOTE — Progress Notes (Signed)
1:1 Note  Patient resting quietly in bed, MHT at bedside. Patient respirations even and unlabored, patient appears in no distress at this time. Patient remains safe on unit with Q15 minute checks for safety. Will continue to monitor.

## 2012-03-28 NOTE — Progress Notes (Signed)
[facility] MD Progress Note  03/28/2012 11:55 AM Edward [name]  MRN:  [id number] Subjective: Patient reports feeling better, continues to be depressed and having suicidal ideas. States he can contract for safety today. He is endorsing hearing voices, voices telling him to hurt himself. Diagnosis:   Axis I: Substance Induced Mood Disorder Axis II: Deferred Axis III:  Past Medical History  Diagnosis Date  . Thyroid disease   . Hepatitis   . Neuromuscular disorder     pinched nerve  . Hypothyroidism   . Mental disorder   . Depression    Axis IV: housing problems, occupational problems and other psychosocial or environmental problems Axis V: 41-50 serious symptoms  ADL's:  Intact  Sleep: Fair  Appetite:  Fair   Psychiatric Specialty Exam: Review of Systems  Constitutional: Positive for malaise/fatigue.  HENT: Positive for congestion.   Eyes: Negative.   Respiratory: Positive for cough.   Cardiovascular: Negative.   Gastrointestinal: Negative.   Genitourinary: Negative.   Musculoskeletal:       Right leg pain  Skin: Negative.   Neurological: Negative.   Endo/Heme/Allergies: Negative.   Psychiatric/Behavioral: Positive for depression, suicidal ideas and hallucinations. The patient is nervous/anxious.     Blood pressure 137/94, pulse 67, temperature 97.8 F (36.6 C), temperature source Oral, resp. rate 16, height 5' 7.5" (1.715 m), weight 78.926 kg (174 lb).Body mass index is 26.85 kg/(m^2).  General Appearance: Casual  Eye Contact::  Fair  Speech:  Clear and Coherent  Volume:  Normal  Mood:  Depressed, Hopeless and Irritable  Affect:  Flat and Labile  Thought Process:  Coherent  Orientation:  Full (Time, Place, and Person)  Thought Content:  Hallucinations: Auditory  Suicidal Thoughts:  Yes.  without intent/plan  Homicidal Thoughts:  No  Memory:  Immediate;   Fair Recent;   Fair Remote;   Fair  Judgement:  Fair  Insight:  Present  Psychomotor Activity:  Decreased   Concentration:  Fair  Recall:  Fair  Akathisia:  No  Handed:  Right  AIMS (if indicated):     Assets:  Communication Skills Desire for Improvement  Sleep:  Number of Hours: 6.75    Current Medications: Current Facility-Administered Medications  Medication Dose Route Frequency Provider Last Rate Last Dose  . acetaminophen (TYLENOL) tablet 650 mg  650 mg Oral Q6H PRN Shuvon Rankin, NP   650 mg at 03/27/12 1631  . alum & mag hydroxide-simeth (MAALOX/MYLANTA) 200-200-20 MG/5ML suspension 30 mL  30 mL Oral Q4H PRN Shuvon Rankin, NP      . [EXPIRED] chlordiazePOXIDE (LIBRIUM) capsule 25 mg  25 mg Oral Q6H PRN Shuvon Rankin, NP      . [EXPIRED] chlordiazePOXIDE (LIBRIUM) capsule 25 mg  25 mg Oral BH-qamhs Shuvon Rankin, NP   25 mg at 03/26/12 2136   Followed by  . [COMPLETED] chlordiazePOXIDE (LIBRIUM) capsule 25 mg  25 mg Oral Daily Shuvon Rankin, NP   25 mg at 03/28/12 0746  . desvenlafaxine (PRISTIQ) 24 hr tablet 100 mg  100 mg Oral Daily Sanjuana Kava, NP   100 mg at 03/28/12 0746  . gabapentin (NEURONTIN) capsule 600 mg  600 mg Oral TID Shuvon Rankin, NP   600 mg at 03/28/12 0746  . [EXPIRED] hydrOXYzine (ATARAX/VISTARIL) tablet 25 mg  25 mg Oral Q6H PRN Shuvon Rankin, NP   25 mg at 03/27/12 1630  . levothyroxine (SYNTHROID, LEVOTHROID) tablet 100 mcg  100 mcg Oral Q0600 Shuvon Rankin, NP   100 mcg at  03/28/12 0650  . [EXPIRED] loperamide (IMODIUM) capsule 2-4 mg  2-4 mg Oral PRN Shuvon Rankin, NP      . magnesium hydroxide (MILK OF MAGNESIA) suspension 30 mL  30 mL Oral Daily PRN Shuvon Rankin, NP      . mirtazapine (REMERON) tablet 15 mg  15 mg Oral QHS Shuvon Rankin, NP   15 mg at 03/27/12 2119  . multivitamin with minerals tablet 1 tablet  1 tablet Oral Daily Shuvon Rankin, NP   1 tablet at 03/28/12 0747  . naproxen (NAPROSYN) tablet 500 mg  500 mg Oral BID PRN Shuvon Rankin, NP   500 mg at 03/28/12 0650  . nicotine (NICODERM CQ - dosed in mg/24 hours) patch 21 mg  21 mg Transdermal  Daily Rachael Fee, MD   21 mg at 03/25/12 1558  . [EXPIRED] ondansetron (ZOFRAN-ODT) disintegrating tablet 4 mg  4 mg Oral Q6H PRN Shuvon Rankin, NP      . pseudoephedrine (SUDAFED) tablet 30 mg  30 mg Oral Q4H PRN Sanjuana Kava, NP   30 mg at 03/27/12 1630  . risperiDONE (RISPERDAL) tablet 0.5 mg  0.5 mg Oral QHS Lizzette Carbonell, MD      . thiamine (VITAMIN B-1) tablet 100 mg  100 mg Oral Daily Shuvon Rankin, NP   100 mg at 03/28/12 0747  . [DISCONTINUED] desvenlafaxine (PRISTIQ) 24 hr tablet 50 mg  50 mg Oral Daily Shuvon Rankin, NP   50 mg at 03/27/12 1326    Lab Results: No results found for this or any previous visit (from the past 48 hour(s)).  Physical Findings: AIMS: Facial and Oral Movements Muscles of Facial Expression: None, normal Lips and Perioral Area: None, normal Jaw: None, normal Tongue: None, normal,Extremity Movements Upper (arms, wrists, hands, fingers): None, normal Lower (legs, knees, ankles, toes): None, normal, Trunk Movements Neck, shoulders, hips: None, normal, Overall Severity Severity of abnormal movements (highest score from questions above): None, normal Incapacitation due to abnormal movements: None, normal Patient's awareness of abnormal movements (rate only patient's report): No Awareness,    CIWA:  CIWA-Ar Total: 1  COWS:     Treatment Plan Summary: Daily contact with patient to assess and evaluate symptoms and progress in treatment Medication management  Plan: Start Risperdal at 0.5mg  po bedtime to target hallucinations. Discontinue 1:1 since patient can contract for safety. Encourage group attendance and involvement.  Medical Decision Making Problem Points:  Established problem, stable/improving (1), Review of last therapy session (1) and Review of psycho-social stressors (1) Data Points:  Review of medication regiment & side effects (2) Review of new medications or change in dosage (2)  I certify that inpatient services furnished can  reasonably be expected to improve the patient's condition.   Tequilla Cousineau 03/28/2012, 11:55 AM

## 2012-03-28 NOTE — Progress Notes (Signed)
[facility] LCSW Group Therapy  03/28/2012 3:11 PM  Type of Therapy:  Group Therapy  Participation Level:  Active  Participation Quality:  Appropriate  Affect:  Depressed  Cognitive:  Appropriate  Insight:  Improving  Engagement in Therapy:  Improving  Modes of Intervention:  Clarification, Education, Problem-solving and Supportive  Summary of Progress/Problems:  Patient shared his life has been out of balance by trying to rescue girlfriend from addiction and not taking care of himself  Edward [name] 03/28/2012, 3:11 PM

## 2012-03-28 NOTE — Progress Notes (Signed)
Patient ID: Edward [name], male   DOB: [birth year], 50 y.o.   MRN: [id number]  D:  Pt endorses passive SI but contracts for safety. Pt is positive for AH, stating a woman telling him he would be better off dead, GOD hates him, and he would be better off dead. Pt denies HI/VH. Pt is pleasant and cooperative. Pt goal for today is to work on her communication with her mother. Pt complains about chronic knee pain from past injury. Pt wears a knee brace.  A: Pt was offered support and encouragement. Pt was given scheduled medications. Pt was encourage to attend groups. Q 15 minute checks were done for safety.   R:Pt  interacts  with peers and staff. Pt is taking medication.Pt receptive to treatment and safety maintained on unit.

## 2012-03-29 MED ORDER — FLUOXETINE HCL 20 MG PO CAPS
20.0000 mg | ORAL_CAPSULE | Freq: Every day | ORAL | Status: DC
  Administered 2012-03-29 – 2012-04-01 (×4): 20 mg via ORAL
  Filled 2012-03-29 (×5): qty 1

## 2012-03-29 MED ORDER — DESVENLAFAXINE SUCCINATE ER 50 MG PO TB24
50.0000 mg | ORAL_TABLET | Freq: Every day | ORAL | Status: DC
  Administered 2012-03-30 – 2012-04-02 (×4): 50 mg via ORAL
  Filled 2012-03-29 (×5): qty 1

## 2012-03-29 NOTE — Progress Notes (Signed)
Patient ID: Edward [name], male   DOB: March 28, 1961, 51 y.o.   MRN: [id number] [facility] MD Progress Note  03/29/2012 11:58 AM Edward [name]  MRN:  [id number]  Subjective: "I need to try on a new medication. The current medicines I'm on are not working. ECT is the only thing that has helped my depression in the past. I did 13 treatments in 13 weeks. I had the ECT done 15 years ago, and I did very well after the treatment. Prozac is also another medicine that has helped me in the past also. I need to try Depakote for my mood also. I know that I have Hepatitis C. Yet, I would like to try the medicine. I need to stay sober for 6 months so that I can receive treatment for my hepatitis C.  Diagnosis:   Axis I: Substance Induced Mood Disorder Axis II: Deferred Axis III:  Past Medical History  Diagnosis Date  . Thyroid disease   . Hepatitis   . Neuromuscular disorder     pinched nerve  . Hypothyroidism   . Mental disorder   . Depression    Axis IV: housing problems, occupational problems and other psychosocial or environmental problems Axis V: 41-50 serious symptoms  ADL's:  Intact  Sleep: Fair  Appetite:  Fair   Psychiatric Specialty Exam: Review of Systems  Constitutional: Positive for malaise/fatigue ( "I feel like I am sleeping too much, and it makes me tired").  HENT: Congestion: Currently on sudafed for it.   Eyes: Negative.   Respiratory: Positive for cough.   Cardiovascular: Negative.   Gastrointestinal: Negative.   Genitourinary: Negative.   Musculoskeletal: Positive for joint pain (Right joint pain).       Right leg pain  Skin: Negative.   Neurological: Negative.   Endo/Heme/Allergies: Negative.   Psychiatric/Behavioral: Positive for depression (currently being stabilized with medication), suicidal ideas and hallucinations. The patient is nervous/anxious.     Blood pressure 127/86, pulse 89, temperature 98.2 F (36.8 C), temperature source Oral, resp. rate 18, height 5' 7.5"  (1.715 m), weight 78.926 kg (174 lb).Body mass index is 26.85 kg/(m^2).  General Appearance: Casual  Eye Contact::  Fair  Speech:  Clear and Coherent  Volume:  Normal  Mood:  Depressed, Hopeless and Irritable  Affect:  Flat and Labile  Thought Process:  Coherent  Orientation:  Full (Time, Place, and Person)  Thought Content:  Hallucinations: Auditory  Suicidal Thoughts:  Yes.  without intent/plan  Homicidal Thoughts:  No  Memory:  Immediate;   Fair Recent;   Fair Remote;   Fair  Judgement:  Fair  Insight:  Present  Psychomotor Activity:  Decreased  Concentration:  Fair  Recall:  Fair  Akathisia:  No  Handed:  Right  AIMS (if indicated):     Assets:  Communication Skills Desire for Improvement  Sleep:  Number of Hours: 5.25    Current Medications: Current Facility-Administered Medications  Medication Dose Route Frequency Provider Last Rate Last Dose  . acetaminophen (TYLENOL) tablet 650 mg  650 mg Oral Q6H PRN Shuvon Rankin, NP   650 mg at 03/28/12 1803  . alum & mag hydroxide-simeth (MAALOX/MYLANTA) 200-200-20 MG/5ML suspension 30 mL  30 mL Oral Q4H PRN Shuvon Rankin, NP      . desvenlafaxine (PRISTIQ) 24 hr tablet 100 mg  100 mg Oral Daily Sanjuana Kava, NP   100 mg at 03/29/12 0750  . gabapentin (NEURONTIN) capsule 600 mg  600 mg Oral TID Assunta Found, NP  600 mg at 03/29/12 1147  . levothyroxine (SYNTHROID, LEVOTHROID) tablet 100 mcg  100 mcg Oral Q0600 Shuvon Rankin, NP   100 mcg at 03/29/12 0653  . magnesium hydroxide (MILK OF MAGNESIA) suspension 30 mL  30 mL Oral Daily PRN Shuvon Rankin, NP      . mirtazapine (REMERON) tablet 15 mg  15 mg Oral QHS Shuvon Rankin, NP   15 mg at 03/28/12 2131  . multivitamin with minerals tablet 1 tablet  1 tablet Oral Daily Shuvon Rankin, NP   1 tablet at 03/29/12 0750  . naproxen (NAPROSYN) tablet 500 mg  500 mg Oral BID PRN Shuvon Rankin, NP   500 mg at 03/29/12 1148  . nicotine (NICODERM CQ - dosed in mg/24 hours) patch 21 mg  21 mg  Transdermal Daily Rachael Fee, MD   21 mg at 03/25/12 1558  . pseudoephedrine (SUDAFED) tablet 30 mg  30 mg Oral Q4H PRN Sanjuana Kava, NP   30 mg at 03/27/12 1630  . risperiDONE (RISPERDAL) tablet 0.5 mg  0.5 mg Oral QHS Himabindu Ravi, MD   0.5 mg at 03/28/12 2131  . risperiDONE (RISPERDAL) tablet 0.5 mg  0.5 mg Oral Once Verne Spurr, PA-C      . thiamine (VITAMIN B-1) tablet 100 mg  100 mg Oral Daily Shuvon Rankin, NP   100 mg at 03/29/12 0750    Lab Results: No results found for this or any previous visit (from the past 48 hour(s)).  Physical Findings: AIMS: Facial and Oral Movements Muscles of Facial Expression: None, normal Lips and Perioral Area: None, normal Jaw: None, normal Tongue: None, normal,Extremity Movements Upper (arms, wrists, hands, fingers): None, normal Lower (legs, knees, ankles, toes): None, normal, Trunk Movements Neck, shoulders, hips: None, normal, Overall Severity Severity of abnormal movements (highest score from questions above): None, normal Incapacitation due to abnormal movements: None, normal Patient's awareness of abnormal movements (rate only patient's report): No Awareness,    CIWA:  CIWA-Ar Total: 4  COWS:     Treatment Plan Summary: Daily contact with patient to assess and evaluate symptoms and progress in treatment Medication management  Plan: Taper off of pristiq (see new order). Start Prozac 20 mg daily. Informed patient that ECT is not being administered in this hospital. Discussed with Dr. Daleen Bo, patient's request for ECT. Encourage group attendance and involvement. Continue current treatment plan.  Medical Decision Making Problem Points:  Established problem, stable/improving (1), Review of last therapy session (1) and Review of psycho-social stressors (1) Data Points:  Review of medication regiment & side effects (2) Review of new medications or change in dosage (2)  I certify that inpatient services furnished can reasonably be  expected to improve the patient's condition.   Armandina Stammer I 03/29/2012, 11:58 AM

## 2012-03-29 NOTE — Progress Notes (Addendum)
[facility] LCSW Group Therapy       Discharge Planning Group 8:30-9:30 AM     03/29/2012 9:39 AM  Type of Therapy:  Discharge Planning  Participation Level:  Minimal  Participation Quality:  Appropriate  Affect:  Depressed  Cognitive:  Appropriate  Insight:  Engaged  Engagement in Therapy:  Engaged  Modes of Intervention:  Education, Geophysicist/field seismologist of Progress/Problems:  Edward [name] advised that he continues to have SI but able to contract for safety.  He rates depression at six, anxiety and hopelessness at five and helplessness at four.   Wynn Banker 03/29/2012, 9:39 AM

## 2012-03-29 NOTE — Progress Notes (Signed)
(  D) Patient in bed during group asleep. Endorses SI and contracts for safety. Did not complete daily self inventory at time of this assessment. Affect flat, sad and depressed. Minimal eye contact. Guarded. (A) Encouraged and supported. (R) Remains depressed. Joice Lofts RN MS EdS 03/29/2012  11:13 AM

## 2012-03-29 NOTE — Progress Notes (Signed)
[facility] LCSW Group Therapy        Feelings Around Relapse    03/29/2012 1:15  Type of Therapy:  Group Therapy  Participation Level:  Active  Participation Quality:  Appropriate and Attentive  Affect:  Blunted and Depressed  Cognitive:  Appropriate  Insight:  Engaged  Engagement in Therapy:  Engaged  Modes of Intervention:  Education, Exploration, Problem-solving and Support  Summary of Progress/Problems:  Patient depressed by engaged in group.  He shared relapse for him is spending too much time in isolation.  He shared he plans to discuss ECT with MD.  Wynn Banker 03/29/2012, 2:27 PM

## 2012-03-30 NOTE — Progress Notes (Signed)
Patient ID: Edward [name], male   DOB: Dec 27, 1960, 51 y.o.   MRN: [id number] D)  Has been rather quiet this evening, affect rather flat, depressed.  Did attend group and came to med window afterward for hs meds.  Conversationa limited to the questions asked, didn't want to engage.  Took his meds and went to bed.   A)  Will continue to monitor for safety. R)  Remains safe on unit at this time.

## 2012-03-30 NOTE — Clinical Social Work Note (Signed)
[facility] Group Notes:  (Clinical Social Work)  03/30/2012   3:00-4:00PM  Summary of Progress/Problems:   The main focus of today's process group was for the patient to identify ways in which they have in the past sabotaged their own recovery and reasons they may have done this/what they received from doing it.  We then worked to identify a specific plan to avoid doing this when discharged from the hospital for this admission.  The patient expressed that he wants ECT, which has worked for him in the past.  He stated he has been depressed since age 51.  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  He intended to commit suicide this time by using the drugs and alcohol, due to the depression.  Patient stared intently throughout group, appeared extremely depressed.  Type of Therapy:  Group Therapy - Process  Participation Level:  Active  Participation Quality:  Attentive and Sharing  Affect:  Blunted and Depressed  Cognitive:  Appropriate  Insight:  Developing/Improving  Engagement in Group:  Developing/Improving  Engagement in Therapy:  Developing/Improving  Modes of Intervention:  Clarification, Education, Limit-setting, Problem-solving, Socialization, Support and Processing   Ambrose Mantle, LCSW 03/30/2012

## 2012-03-30 NOTE — Progress Notes (Signed)
[facility] MD Progress Note  03/30/2012 12:21 PM Edward [name]  MRN:  [id number] Subjective:  Edward [name] is lying in bed at lunchtime and states "I am really down." He rates his depression as a 10 on a scale of 1-10, where 10 is the worst. He rates his anxiety as a 1 on a scale of 1-10. He endorses suicidal thoughts, but denies that he has any plan or intent currently. He states that his thoughts are racing and he is unable to develop a plan. He also endorses auditory hallucinations of a derogatory nature. The voices tell him that he is worthless and would be better off dead. He reports that he ate breakfast this morning, but has no appetite for lunch. He also endorses insomnia. He denies any homicidal thoughts or visual hallucinations. He is tolerating the Risperdal well. He reports the only thing that helped with the voices in the past was Haldol or Zyprexa. He is uncertain why that is not prescribed now. When asked what he thought would be helpful, he stated "I really want to get more ECT."  Diagnosis:   Axis I: Substance Induced Mood Disorder Axis II: Deferred Axis III:  Past Medical History  Diagnosis Date  . Thyroid disease   . Hepatitis   . Neuromuscular disorder     pinched nerve  . Hypothyroidism   . Mental disorder   . Depression     ADL's:  Impaired  Sleep: Poor  Appetite:  Fair  Suicidal Ideation:  Patient admits to suicidal thoughts, but denies any current plan or intent. Homicidal Ideation:  Patient denies any thought, plan, or intent AEB (as evidenced by):  Psychiatric Specialty Exam: Review of Systems  Constitutional: Positive for malaise/fatigue.  HENT: Negative.   Eyes: Negative.   Respiratory: Negative.   Cardiovascular: Negative.   Gastrointestinal: Negative.   Genitourinary: Negative.   Musculoskeletal: Negative.   Skin: Negative.   Neurological: Negative.   Endo/Heme/Allergies: Negative.   Psychiatric/Behavioral: Positive for depression, suicidal ideas and  hallucinations. The patient has insomnia.     Blood pressure 120/73, pulse 69, temperature 98.1 F (36.7 C), temperature source Oral, resp. rate 16, height 5' 7.5" (1.715 m), weight 78.926 kg (174 lb).Body mass index is 26.85 kg/(m^2).  General Appearance: Disheveled  Eye Solicitor::  Fair  Speech:  Clear and Coherent  Volume:  Decreased  Mood:  Depressed, Hopeless and Worthless  Affect:  Congruent  Thought Process:  Linear  Orientation:  Full (Time, Place, and Person)  Thought Content:  Hallucinations: Auditory  Suicidal Thoughts:  Yes.  without intent/plan  Homicidal Thoughts:  No  Memory:  Immediate;   Good Recent;   Good Remote;   Good  Judgement:  Impaired  Insight:  Lacking  Psychomotor Activity:  Psychomotor Retardation  Concentration:  Good  Recall:  Good  Akathisia:  No  Handed:  Right  AIMS (if indicated):     Assets:  Desire for Improvement  Sleep:  Number of Hours: 6    Current Medications: Current Facility-Administered Medications  Medication Dose Route Frequency Provider Last Rate Last Dose  . acetaminophen (TYLENOL) tablet 650 mg  650 mg Oral Q6H PRN Shuvon Rankin, NP   650 mg at 03/28/12 1803  . alum & mag hydroxide-simeth (MAALOX/MYLANTA) 200-200-20 MG/5ML suspension 30 mL  30 mL Oral Q4H PRN Shuvon Rankin, NP      . desvenlafaxine (PRISTIQ) 24 hr tablet 50 mg  50 mg Oral Daily Sanjuana Kava, NP      .  FLUoxetine (PROZAC) capsule 20 mg  20 mg Oral Daily Sanjuana Kava, NP   20 mg at 03/29/12 1635  . gabapentin (NEURONTIN) capsule 600 mg  600 mg Oral TID Shuvon Rankin, NP   600 mg at 03/29/12 1634  . levothyroxine (SYNTHROID, LEVOTHROID) tablet 100 mcg  100 mcg Oral Q0600 Shuvon Rankin, NP   100 mcg at 03/30/12 0651  . magnesium hydroxide (MILK OF MAGNESIA) suspension 30 mL  30 mL Oral Daily PRN Shuvon Rankin, NP      . mirtazapine (REMERON) tablet 15 mg  15 mg Oral QHS Shuvon Rankin, NP   15 mg at 03/29/12 2122  . multivitamin with minerals tablet 1 tablet  1  tablet Oral Daily Shuvon Rankin, NP   1 tablet at 03/29/12 0750  . naproxen (NAPROSYN) tablet 500 mg  500 mg Oral BID PRN Shuvon Rankin, NP   500 mg at 03/29/12 1148  . nicotine (NICODERM CQ - dosed in mg/24 hours) patch 21 mg  21 mg Transdermal Daily Rachael Fee, MD   21 mg at 03/25/12 1558  . pseudoephedrine (SUDAFED) tablet 30 mg  30 mg Oral Q4H PRN Sanjuana Kava, NP   30 mg at 03/27/12 1630  . risperiDONE (RISPERDAL) tablet 0.5 mg  0.5 mg Oral QHS Himabindu Ravi, MD   0.5 mg at 03/29/12 2122  . risperiDONE (RISPERDAL) tablet 0.5 mg  0.5 mg Oral Once Verne Spurr, PA-C      . thiamine (VITAMIN B-1) tablet 100 mg  100 mg Oral Daily Shuvon Rankin, NP   100 mg at 03/29/12 0750  . [DISCONTINUED] desvenlafaxine (PRISTIQ) 24 hr tablet 100 mg  100 mg Oral Daily Sanjuana Kava, NP   100 mg at 03/29/12 0750    Lab Results: No results found for this or any previous visit (from the past 48 hour(s)).  Physical Findings: AIMS: Facial and Oral Movements Muscles of Facial Expression: None, normal Lips and Perioral Area: None, normal Jaw: None, normal Tongue: None, normal,Extremity Movements Upper (arms, wrists, hands, fingers): None, normal Lower (legs, knees, ankles, toes): None, normal, Trunk Movements Neck, shoulders, hips: None, normal, Overall Severity Severity of abnormal movements (highest score from questions above): None, normal Incapacitation due to abnormal movements: None, normal Patient's awareness of abnormal movements (rate only patient's report): No Awareness,    CIWA:  CIWA-Ar Total: 4  COWS:     Treatment Plan Summary: Daily contact with patient to assess and evaluate symptoms and progress in treatment Medication management We will continue his current plan of care, and consider referral for ECT  Plan:  Medical Decision Making Problem Points:  Established problem, stable/improving (1), Review of last therapy session (1) and Review of psycho-social stressors (1) Data  Points:  Review or order clinical lab tests (1) Review of medication regiment & side effects (2) Review of new medications or change in dosage (2)  I certify that inpatient services furnished can reasonably be expected to improve the patient's condition.   Homero Hyson 03/30/2012, 12:21 PM

## 2012-03-30 NOTE — Progress Notes (Signed)
Goals Group Note  Date:  03/30/2012 Time:  0900  Group Topic/Focus:  Goals Group:   The focus of this group is to help the patients identify goals they want to achieve, it introduces the Saturday Patient Workbook to them and it helps motivate them to begin to take the steps they need to..in order to become healthier individuals.  Participation Level: Did Not Attend  Participation Quality:  Not Applicable  Affect:  Not Applicable  Cognitive:  Not Applicable  Insight:  Not Applicable  Engagement in Group: Not Applicable  Additional Comments:  1700  Edward [name] 03/30/2012, 5:13 PM

## 2012-03-30 NOTE — Progress Notes (Signed)
Psychoeducational Group Note  Date:  03/29/2012 Time:  2000  Group Topic/Focus:  Wrap-Up Group:   The focus of this group is to help patients review their daily goal of treatment and discuss progress on daily workbooks.  Participation Level:  Active  Participation Quality:  Appropriate and Sharing  Affect:  Appropriate  Cognitive:  Appropriate  Insight:  Engaged  Engagement in Group:  Engaged  Additional Comments:  Pt described his day being "okay", pt enjoyed going outside and getting to know his peers. Pt plans to discuss ECT with MD.   Edward [name] A 03/30/2012, 2:22 AM

## 2012-03-30 NOTE — Progress Notes (Signed)
Edward [name]  Has slept in his bed most of the day. He  Initially would not get up this morning to take his meds. He opted to miss his morning groups, but did speak with his dr.   [name] HE got up around 3p, took some of his missed meds, and has been up ever since. His affect is flat, constricted and he  Does not initiate conversation.   R Safety is in place. POC will cont and pt will be encouraged to take a part in his recovery and come out of his room.

## 2012-03-31 DIAGNOSIS — F1910 Other psychoactive substance abuse, uncomplicated: Secondary | ICD-10-CM

## 2012-03-31 NOTE — Progress Notes (Signed)
[facility] MD Progress Note  03/31/2012 12:04 PM Edward [name]  MRN:  [id number] Subjective:  Patient complains of depression Diagnosis:   Axis I: Substance Abuse and Substance Induced Mood Disorder Axis II: Deferred Axis III:  Past Medical History  Diagnosis Date  . Thyroid disease   . Hepatitis   . Neuromuscular disorder     pinched nerve  . Hypothyroidism   . Mental disorder   . Depression    Axis IV: economic problems, other psychosocial or environmental problems, problems related to social environment and problems with primary support group Axis V: 41-50 serious symptoms  ADL's:  Intact  Sleep: Fair  Appetite:  Fair  Suicidal Ideation:  Denies Homicidal Ideation:  Denies  Psychiatric Specialty Exam: ROS:  Positive for knee pain and depession  Blood pressure 129/88, pulse 61, temperature 96.4 F (35.8 C), temperature source Oral, resp. rate 18, height 5' 7.5" (1.715 m), weight 78.926 kg (174 lb).Body mass index is 26.85 kg/(m^2).  General Appearance: Disheveled  Eye Solicitor::  Fair  Speech:  Slow  Volume:  Decreased  Mood:  Depressed  Affect:  Congruent  Thought Process:  Logical  Orientation:  Full (Time, Place, and Person)  Thought Content:  WDL  Suicidal Thoughts:  No  Homicidal Thoughts:  No  Memory:  Immediate;   Fair Recent;   Fair Remote;   Fair  Judgement:  Fair  Insight:  Lacking  Psychomotor Activity:  Decreased  Concentration:  Fair  Recall:  Fair  Akathisia:  No  Handed:  Right  AIMS (if indicated):     Assets:  Resilience  Sleep:  Number of Hours: 1    Current Medications: Current Facility-Administered Medications  Medication Dose Route Frequency Provider Last Rate Last Dose  . acetaminophen (TYLENOL) tablet 650 mg  650 mg Oral Q6H PRN Shuvon Rankin, NP   650 mg at 03/30/12 2051  . alum & mag hydroxide-simeth (MAALOX/MYLANTA) 200-200-20 MG/5ML suspension 30 mL  30 mL Oral Q4H PRN Shuvon Rankin, NP      . desvenlafaxine (PRISTIQ) 24 hr tablet  50 mg  50 mg Oral Daily Sanjuana Kava, NP   50 mg at 03/30/12 1436  . FLUoxetine (PROZAC) capsule 20 mg  20 mg Oral Daily Sanjuana Kava, NP   20 mg at 03/30/12 1437  . gabapentin (NEURONTIN) capsule 600 mg  600 mg Oral TID Shuvon Rankin, NP   600 mg at 03/30/12 1616  . levothyroxine (SYNTHROID, LEVOTHROID) tablet 100 mcg  100 mcg Oral Q0600 Shuvon Rankin, NP   100 mcg at 03/31/12 4098  . magnesium hydroxide (MILK OF MAGNESIA) suspension 30 mL  30 mL Oral Daily PRN Shuvon Rankin, NP      . mirtazapine (REMERON) tablet 15 mg  15 mg Oral QHS Shuvon Rankin, NP   15 mg at 03/30/12 2104  . multivitamin with minerals tablet 1 tablet  1 tablet Oral Daily Shuvon Rankin, NP   1 tablet at 03/29/12 0750  . naproxen (NAPROSYN) tablet 500 mg  500 mg Oral BID PRN Shuvon Rankin, NP   500 mg at 03/30/12 1615  . nicotine (NICODERM CQ - dosed in mg/24 hours) patch 21 mg  21 mg Transdermal Daily Rachael Fee, MD   21 mg at 03/25/12 1558  . pseudoephedrine (SUDAFED) tablet 30 mg  30 mg Oral Q4H PRN Sanjuana Kava, NP   30 mg at 03/27/12 1630  . risperiDONE (RISPERDAL) tablet 0.5 mg  0.5 mg Oral QHS Himabindu Ravi,  MD   0.5 mg at 03/30/12 2104  . risperiDONE (RISPERDAL) tablet 0.5 mg  0.5 mg Oral Once Verne Spurr, PA-C      . thiamine (VITAMIN B-1) tablet 100 mg  100 mg Oral Daily Shuvon Rankin, NP   100 mg at 03/29/12 0750    Lab Results: No results found for this or any previous visit (from the past 48 hour(s)).  Physical Findings: AIMS: Facial and Oral Movements Muscles of Facial Expression: None, normal Lips and Perioral Area: None, normal Jaw: None, normal Tongue: None, normal,Extremity Movements Upper (arms, wrists, hands, fingers): None, normal Lower (legs, knees, ankles, toes): None, normal, Trunk Movements Neck, shoulders, hips: None, normal, Overall Severity Severity of abnormal movements (highest score from questions above): None, normal Incapacitation due to abnormal movements: None,  normal Patient's awareness of abnormal movements (rate only patient's report): No Awareness, Dental Status Current problems with teeth and/or dentures?: No Does patient usually wear dentures?: No  CIWA:  CIWA-Ar Total: 4  COWS:     Treatment Plan Summary: Daily contact with patient to assess and evaluate symptoms and progress in treatment Medication management  Plan:  Patient complains of depression of 10/10, contracts for safety, affect congruent with mood, denies anxiety, concentration fair, denies suicidal/homicidal ideations and hallucinations, encouraging the patient to use coping skills and attend groups, close monitoring continues.    Medical Decision Making Problem Points:  Review of psycho-social stressors (1) Data Points:  Review of medication regiment & side effects (2)  I certify that inpatient services furnished can reasonably be expected to improve the patient's condition.   [name], PMH-NP 03/31/2012, 12:04 PM

## 2012-03-31 NOTE — Progress Notes (Signed)
D: Pt remained in bed for much of shift.  Came out of room for group and for meds.  Reported R knee pain (8/10) at 2051.  Facial expression flat with fair eye contact.  Both affect and mood depressed.  Speech is logical and coherent and normal in rate/rhythm/volume.  Pt demonstrates limited insight and judgment is impaired regarding his current situation.  Interaction is assertive and cooperative, participated in evening shift groups.  No evidence of disorganized thought process or content.  Reports ongoing active SI but is able to verbally contract with RN for safety on unit.  Denies HI, AVH.  Wearing knee brace when in bed; walks with limp.  A: Pt avoidant of questions regarding his well-being; was educated that it is part of the treatment for staff to assess safety levels multiple times per day.  Received PRN Tylenol 650mg  PO at 2051 for knee pain with some relief.  All medications administered according to med orders and POC.  Q15 minute safety checks maintained as per unit protocol.  Pt encouraged to use staff as support and source of resources when he chooses.  R: Pt superficial and generally isolative.  Safety maintained.  Will continue to monitor. Dion Saucier RN

## 2012-03-31 NOTE — Clinical Social Work Note (Signed)
[facility] Group Notes:  (Clinical Social Work)  03/31/2012   3:00-4:00PM  Summary of Progress/Problems:   Summary of Progress/Problems:   The main focus of today's process group was for the patient to define "support" and describe what healthy supports are, then to identify the patient's current support system and decide on other supports that can be put in place to prevent future hospitalizations.  Roleplay was used to demonstrate definitions of different types of available supports.  An emphasis was placed on using therapist, doctor, therapy groups, self-help groups and problem-specific support groups to expand supports. The patient expressed some helpful ideas to group, and interacted much more than yesterday.  Type of Therapy:  Process Group  Participation Level:  Active  Participation Quality:  Appropriate, Attentive and Sharing  Affect:  Blunted  Cognitive:  Alert, Appropriate and Oriented  Insight:  Engaged  Engagement in Therapy:  Engaged  Modes of Intervention:  Clarification, Education, Limit-setting, Problem-solving, Socialization, Support and Processing, Exploration, Discussion   Ambrose Mantle, LCSW 03/31/2012, 5:55 PM

## 2012-03-31 NOTE — Progress Notes (Signed)
D Pt spent most of the first part of the day ( from 0700-1430) asleep in his bed..influenza his room. He refused to get OOB this morning to take his AM medications. HE got up after lunch, around 1400 and demanded his AM medications. His affect is blunted, guarded and he avoids eye contact.     A He does not attend his groups, and demonstrates little acceptance of hospital milieu, POC and little  engagement into his recovery.   R Con to maintain safety and POC.

## 2012-04-01 MED ORDER — ARIPIPRAZOLE 2 MG PO TABS
2.0000 mg | ORAL_TABLET | Freq: Every day | ORAL | Status: DC
  Administered 2012-04-01 – 2012-04-06 (×6): 2 mg via ORAL
  Filled 2012-04-01 (×7): qty 1

## 2012-04-01 MED ORDER — ARIPIPRAZOLE 5 MG PO TABS
5.0000 mg | ORAL_TABLET | Freq: Every day | ORAL | Status: DC
  Filled 2012-04-01: qty 1

## 2012-04-01 MED ORDER — TRAZODONE HCL 100 MG PO TABS: 100.0000 mg | ORAL_TABLET | Freq: Every day | ORAL | Status: DC

## 2012-04-01 MED ORDER — HYDROXYZINE HCL 50 MG PO TABS
50.0000 mg | ORAL_TABLET | Freq: Every day | ORAL | Status: DC
  Administered 2012-04-01 – 2012-04-08 (×8): 50 mg via ORAL
  Filled 2012-04-01 (×10): qty 1

## 2012-04-01 MED ORDER — FLUOXETINE HCL 10 MG PO CAPS
30.0000 mg | ORAL_CAPSULE | Freq: Every day | ORAL | Status: DC
  Administered 2012-04-02: 30 mg via ORAL
  Filled 2012-04-01 (×2): qty 3

## 2012-04-01 NOTE — Progress Notes (Signed)
[facility] INPATIENT:  Family/Significant Other Suicide Prevention Education  Suicide Prevention Education:  Education Completed; Limmie Patricia- Sister - [phone]  has been identified by the patient as the family member/significant other with whom the patient will be residing, and identified as the person(s) who will aid the patient in the event of a mental health crisis (suicidal ideations/suicide attempt).  With written consent from the patient, the family member/significant other has been provided the following suicide prevention education, prior to the and/or following the discharge of the patient.  The suicide prevention education provided includes the following:  Suicide risk factors  Suicide prevention and interventions  National Suicide Hotline telephone number  [facility] assessment telephone number  [facility] Emergency Assistance 911  [facility] and/or Residential Mobile Crisis Unit telephone number  Request made of family/significant other to:  Remove weapons (e.g., guns, rifles, knives), all items previously/currently identified as safety concern.  Sister is not aware of patient having a gun. She believes he has all of his belonging her with him.  Remove drugs/medications (over-the-counter, prescriptions, illicit drugs), all items previously/currently identified as a safety concern.  The family member/significant other verbalizes understanding of the suicide prevention education information provided.  The family member/significant other agrees to remove the items of safety concern listed above.  Wynn Banker 04/01/2012, 4:44 PM

## 2012-04-01 NOTE — Tx Team (Signed)
Interdisciplinary Treatment Plan Update (Adult)  Date:  04/01/2012  Time Reviewed:  9:47 AM   Progress in Treatment: Attending groups:   Yes   Participating in groups:  Yes Taking medication as prescribed:  Yes Tolerating medication:  Yes Family/Significant othe contact made: Contact to be made with family Patient understands diagnosis:  Yes Discussing patient identified problems/goals with staff: Yes Medical problems stabilized or resolved: Yes Denies suicidal/homicidal ideation: No but able to contract for safety Issues/concerns per patient self-inventory:  Other:   New problem(s) identified:  Reason for Continuation of Hospitalization: Anxiety Depression Medication stabilization Suicidal ideation  Interventions implemented related to continuation of hospitalization:  Medication Management; safety checks q 15 mins  Additional comments:  Estimated length of stay:  2-3 days  Discharge Plan:  Home with outpatient follow up  New goal(s):  Review of initial/current patient goals per problem list:    1.  Goal(s): Eliminate SI/other thoughts of self harm   Met:  No  Target date: d/c  As evidenced by: Patient will no longer endorse SI/HI or other thoughts of self harm.    2.  Goal (s):Reduce depression/anxiety (rated at eight and ten today)  Met: Yes  Target date: d/c  As evidenced by: Patient will rate symptoms at four or below    3.  Goal(s):.stabilize on meds   Met:  No  Target date: d/c  As evidenced by: Patient will report being stabilized on medications - less symptomatic    4.  Goal(s): Refer for outpatient follow up   Met:  Yes  Target date: d/c  As evidenced by: Follow up appointment schedule  Attendees: Patient:   04/01/2012 9:47 AM  Physican:  Patrick North, MD 04/01/2012 9:47 AM  Nursing:  Neill Loft, RN 04/01/2012 9:47 AM   Nursing:   Quintella Reichert, RN 04/01/2012 9:47 AM   Clinical Social Worker:  Juline Patch, LCSW  04/01/2012 9:47 AM   Other: Serena Colonel, FNP 04/01/2012 9:47 AM   Other:  Patton Salles, Clinical Social Worker, LCSW  04/01/2012 9:47 AM Other:        04/01/2012 9:47 AM

## 2012-04-01 NOTE — Progress Notes (Signed)
Patient did attend the evening speaker AA meeting.  

## 2012-04-01 NOTE — Progress Notes (Signed)
Psychoeducational Group Note  Date:  04/01/2012 Time:  1100  Group Topic/Focus:  Wellness Toolbox:   The focus of this group is to discuss various aspects of wellness, balancing those aspects and exploring ways to increase the ability to experience wellness.  Patients will create a wellness toolbox for use upon discharge.  Participation Level:  Did Not Attend   Meredith Staggers 04/01/2012, 3:14 PM

## 2012-04-01 NOTE — Progress Notes (Addendum)
[facility] MD Progress Note  04/01/2012 1:33 PM Edward [name]  MRN:  [id number]  Subjective: "I still feel suicidal. I know my medicines are not working. Risperdal has not helped me before, and I don't think it will this time.  When I took Prozac in the past, it was in combination with Abilify. If I can't get ECT, why can't I be given the medicines that will help me out. I did not sleep a wink last night. My mood is not stable if I don't sleep.  I don't want to think about suicide all the time because I don't want to die. I made a promise to my little sister that I will never attempt suicide again. I want to keep that promise, you guys ought to help me do that. Even when I get discharged from here, I don't have any home to go to".  Diagnosis:   Axis I: Substance Induced Mood Disorder and Alcohol dependence, Cocaine abuse Axis II: Deferred Axis III:  Past Medical History  Diagnosis Date  . Thyroid disease   . Hepatitis   . Neuromuscular disorder     pinched nerve  . Hypothyroidism   . Mental disorder   . Depression    Axis IV: economic problems, housing problems, occupational problems and other psychosocial or environmental problems Axis V: 11-20 some danger of hurting self or others possible OR occasionally fails to maintain minimal personal hygiene OR gross impairment in communication  ADL's:  Intact  Sleep: "I have not been sleeping, worsened last night".  Appetite:  Good  Suicidal Ideation: "Yes" Plan:  No Intent:  No Means:  No Homicidal Ideation:  Plan:  No Intent:  No Means:  No  AEB (as evidenced by): per patient's review.  Psychiatric Specialty Exam: Review of Systems  Constitutional: Negative.   HENT: Negative.   Eyes: Negative.   Respiratory: Positive for cough (Reported coughing and congestion).   Cardiovascular: Negative.   Gastrointestinal: Negative.   Genitourinary: Negative.   Musculoskeletal: Negative.   Skin: Negative.   Neurological: Negative.    Endo/Heme/Allergies: Negative.   Psychiatric/Behavioral: Positive for depression, suicidal ideas and substance abuse (Hx. of). Negative for hallucinations and memory loss. The patient is nervous/anxious and has insomnia.     Blood pressure 121/73, pulse 60, temperature 97.8 F (36.6 C), temperature source Oral, resp. rate 16, height 5' 7.5" (1.715 m), weight 78.926 kg (174 lb).Body mass index is 26.85 kg/(m^2).  General Appearance: Casual  Eye Contact::  Fair  Speech:  Clear and Coherent  Volume:  Normal  Mood:  Depressed  Affect:  Depressed and Flat  Thought Process:  Coherent  Orientation:  Full (Time, Place, and Person)  Thought Content:  Rumination  Suicidal Thoughts:  Yes.  without intent/plan  Homicidal Thoughts:  No  Memory:  Immediate;   Good Recent;   Good Remote;   Good  Judgement:  Impaired  Insight:  Lacking  Psychomotor Activity:  Normal  Concentration:  Fair  Recall:  Good  Akathisia:  No  Handed:  Right  AIMS (if indicated):     Assets:  Desire for Improvement  Sleep:  Number of Hours: 2    Current Medications: Current Facility-Administered Medications  Medication Dose Route Frequency Provider Last Rate Last Dose  . acetaminophen (TYLENOL) tablet 650 mg  650 mg Oral Q6H PRN Shuvon Rankin, NP   650 mg at 04/01/12 1135  . alum & mag hydroxide-simeth (MAALOX/MYLANTA) 200-200-20 MG/5ML suspension 30 mL  30 mL Oral Q4H  PRN Shuvon Rankin, NP      . ARIPiprazole (ABILIFY) tablet 5 mg  5 mg Oral Q2000 Sanjuana Kava, NP      . desvenlafaxine (PRISTIQ) 24 hr tablet 50 mg  50 mg Oral Daily Sanjuana Kava, NP   50 mg at 04/01/12 0846  . FLUoxetine (PROZAC) capsule 30 mg  30 mg Oral Daily Sanjuana Kava, NP      . gabapentin (NEURONTIN) capsule 600 mg  600 mg Oral TID Shuvon Rankin, NP   600 mg at 04/01/12 1135  . hydrOXYzine (ATARAX/VISTARIL) tablet 50 mg  50 mg Oral Q2000 Sanjuana Kava, NP      . levothyroxine (SYNTHROID, LEVOTHROID) tablet 100 mcg  100 mcg Oral Q0600  Shuvon Rankin, NP   100 mcg at 04/01/12 0617  . magnesium hydroxide (MILK OF MAGNESIA) suspension 30 mL  30 mL Oral Daily PRN Shuvon Rankin, NP      . mirtazapine (REMERON) tablet 15 mg  15 mg Oral QHS Shuvon Rankin, NP   15 mg at 03/31/12 2126  . multivitamin with minerals tablet 1 tablet  1 tablet Oral Daily Shuvon Rankin, NP   1 tablet at 04/01/12 0845  . naproxen (NAPROSYN) tablet 500 mg  500 mg Oral BID PRN Shuvon Rankin, NP   500 mg at 04/01/12 0846  . nicotine (NICODERM CQ - dosed in mg/24 hours) patch 21 mg  21 mg Transdermal Daily Rachael Fee, MD   21 mg at 04/01/12 1610  . pseudoephedrine (SUDAFED) tablet 30 mg  30 mg Oral Q4H PRN Sanjuana Kava, NP   30 mg at 04/01/12 0846  . thiamine (VITAMIN B-1) tablet 100 mg  100 mg Oral Daily Shuvon Rankin, NP   100 mg at 04/01/12 0847  . [DISCONTINUED] FLUoxetine (PROZAC) capsule 20 mg  20 mg Oral Daily Sanjuana Kava, NP   20 mg at 04/01/12 0846  . [DISCONTINUED] risperiDONE (RISPERDAL) tablet 0.5 mg  0.5 mg Oral QHS Himabindu Ravi, MD   0.5 mg at 03/31/12 2126  . [DISCONTINUED] risperiDONE (RISPERDAL) tablet 0.5 mg  0.5 mg Oral Once Verne Spurr, PA-C      . [DISCONTINUED] traZODone (DESYREL) tablet 100 mg  100 mg Oral QHS Sanjuana Kava, NP        Lab Results: No results found for this or any previous visit (from the past 48 hour(s)).  Physical Findings: AIMS: Facial and Oral Movements Muscles of Facial Expression: None, normal Lips and Perioral Area: None, normal Jaw: None, normal Tongue: None, normal,Extremity Movements Upper (arms, wrists, hands, fingers): None, normal Lower (legs, knees, ankles, toes): None, normal, Trunk Movements Neck, shoulders, hips: None, normal, Overall Severity Severity of abnormal movements (highest score from questions above): None, normal Incapacitation due to abnormal movements: None, normal Patient's awareness of abnormal movements (rate only patient's report): No Awareness, Dental Status Current  problems with teeth and/or dentures?: No Does patient usually wear dentures?: No  CIWA:  CIWA-Ar Total: 4  COWS:     Treatment Plan Summary: Daily contact with patient to assess and evaluate symptoms and progress in treatment Medication management  Plan: Discontinued Risperdal. Start Abilify 2 mg Q bedtime. Hydroxyzine 50 mg Q bedtime for sleep. Increase prozac to 30 mg daily. Continue current treatment plan.  Medical Decision Making Problem Points:  Established problem, worsening (2), New problem, with additional work-up planned (4), Review of last therapy session (1) and Review of psycho-social stressors (1) Data Points:  Review of  medication regiment & side effects (2) Review of new medications or change in dosage (2)  I certify that inpatient services furnished can reasonably be expected to improve the patient's condition.   Armandina Stammer I 04/01/2012, 1:33 PM

## 2012-04-01 NOTE — Progress Notes (Signed)
D: Patient denies HI and A/V hallucinations and admits to some on and off thoughts of SI;  rates depression as 8/10; rates hopelessness 6/10; rates anxiety as 10/10; complaints of congestion and anxiety  A: Monitored q 15 minutes; patient encouraged to attend groups; patient educated about medications; patient given medications per physician orders; patient encouraged to express feelings and/or concerns; prn medications  R: Patient remains passively SI but contracts; patient's interaction with staff and peers is appropriate; patient was able to set goal to talk with staff 1:1 when having feelings of SI; patient is taking medications as prescribed and tolerating medications; patient is attended evening group

## 2012-04-01 NOTE — Progress Notes (Addendum)
Patient ID: Edward [name], male   DOB: [birth year], 51 y.o.   MRN: [id number] D)  Has been out on the hall more this evening, attended group, remains guarded, conversations are minimal, but eye contact seems to have improved slightly.  Received naproxen for rt knee pain tonight, requested tylenol be changed to ibuprofen.  Seems to want something but not verbalizing it.  Denies thoughts of self harm tonight.  Stated has trouble sleeping, was given hs meds, discussed relaxation techniques. A)  Will continue POC, continue to monitor for safety. R)  Safety maintained.

## 2012-04-01 NOTE — Progress Notes (Signed)
Patient ID: Edward [name], male   DOB: [birth year], 51 y.o.   MRN: [id number] Patient reports he did not sleep at all last night.  He says that he felt poorly this am, c/o feeling nauseated and having abdominal discomfort from gas.  He also complained of congestion.  He continues to c/o R knee pain.  A- Gave patient heat pack for knee. Talked with him about his relationship with his girlfriend who he hopes will go into rehab.  Patient attending groups in pm.

## 2012-04-01 NOTE — Progress Notes (Signed)
[facility] LCSW Aftercare Discharge Planning Group Note  04/01/2012 8:30   Participation Quality:  Appropriate  Affect:  Appropriate  Cognitive:  Appropriate  Insight:  Engaged  Engagement in Group:  Engaged  Modes of Intervention:  Education, Geophysicist/field seismologist of Progress/Problems:Patient continues to endorse SI and contracts for safety.  He rates depression at eight and anxiety at ten.  Patient stated he plans to talk with MD about ECT.  Wynn Banker 04/01/2012, 4:15 PM

## 2012-04-01 NOTE — Progress Notes (Signed)
[facility] LCSW Group Therapy       Overcoming Obstacles 1:15 2:30 PM         04/01/2012 4:17 PM  Type of Therapy:  Group Therapy  Participation Level:  Minimal  Participation Quality:  Appropriate  Affect:  Appropriate and Depressed  Cognitive:  Appropriate  Insight: Limited  Engagement in Therapy:  Limited  Modes of Intervention:  Clarification, Education, Problem-solving and Support  Summary of Progress/Problems:  Patient shared his ego is the obstacle he has to remove.  Wynn Banker 04/01/2012, 4:17 PM

## 2012-04-01 NOTE — Progress Notes (Signed)
Psychoeducational Group Note  Date:  04/01/2012 Time: 2000 Group Topic/Focus:  Wrap-Up Group:   The focus of this group is to help patients review their daily goal of treatment and discuss progress on daily workbooks.  Participation Level:  Active  Participation Quality:  Appropriate  Affect:  Appropriate  Cognitive:  Appropriate  Insight:  Engaged  Engagement in Group:  Engaged  Additional Comments:  Pt. Attended and participated in group  Gwenevere Ghazi Patience 04/01/2012, 11:16 PM

## 2012-04-02 MED ORDER — SALINE SPRAY 0.65 % NA SOLN
1.0000 | NASAL | Status: DC | PRN
  Administered 2012-04-02: 1 via NASAL
  Filled 2012-04-02: qty 44

## 2012-04-02 MED ORDER — FLUOXETINE HCL 20 MG PO CAPS
40.0000 mg | ORAL_CAPSULE | Freq: Every day | ORAL | Status: DC
  Administered 2012-04-03 – 2012-04-05 (×3): 40 mg via ORAL
  Filled 2012-04-02 (×5): qty 2

## 2012-04-02 NOTE — Progress Notes (Signed)
Psychoeducational Group Note  Date:  04/02/2012 Time: 2000 Group Topic/Focus:  Wrap-Up Group:   The focus of this group is to help patients review their daily goal of treatment and discuss progress on daily workbooks.  Participation Level: Did Not Attend  Participation Quality:  Not Applicable  Affect:  Not Applicable  Cognitive:  Not Applicable  Insight:  Not Applicable  Engagement in Group: Not Applicable  Additional Comments:  Pt. Didn't attended wrap up group   Gwenevere Ghazi Patience 04/02/2012, 10:01 PM

## 2012-04-02 NOTE — Progress Notes (Signed)
Patient ID: Edward [name], male   DOB: 02/08/1961, 51 y.o.   MRN: [id number] D- Patient reports poor sleep and good appetite.  He reports low energy and improving ability to pay attention.  His depression is an 8 and his hopelessness an 8.  He continues to report suicidal thoughts off and on.  He is also complaining of congestion with little relief from medication.  A- Asked patient to contract for safety R- He readily does so.  He is also concerned that he has not been able to contact his girlfriend.  He says he knows he needs to concentrate on himself and his own recovery.

## 2012-04-02 NOTE — Progress Notes (Signed)
[facility] LCSW Group Therapy      Feelings About Diagnosis          04/02/2012 2:00 PM  Type of Therapy:  Group Therapy  Participation Level:  Active  Participation Quality:  Appropriate and Attentive  Affect:  Appropriate  Cognitive:  Alert and Appropriate  Insight:  Engaged  Engagement in Therapy:  Engaged  Modes of Intervention:  Education, Exploration, Problem-solving and Support  Summary of Progress/Problems:   Patient shared he has been please with getting the diagnosis of PTSD.  He stated it allows him to know there is help and something can be done to make his life better.  Edward [name] 04/02/2012, 2:00 PM

## 2012-04-02 NOTE — Progress Notes (Signed)
D: Patient resting in bed with eyes closed.  Respirations even and unlabored.  Patient appears to be in no apparent distress. A: Staff to monitor Q 15 mins for safety.   R:Patient remains safe on the unit.  

## 2012-04-02 NOTE — Progress Notes (Signed)
[facility] LCSW Aftercare Discharge Planning Group Note  04/02/2012 11:46 AM  Participation Quality:  Appropriate  Affect:  Appropriate  Cognitive:  Appropriate  Insight:  Engaged  Engagement in Group:  Engaged  Modes of Intervention:  Education, Exploration, Problem-solving and Support  Summary of Progress/Problems:  Patient reports being better today.  He endorses SI but no plan or intent and continues to contract for safety.  Patient rates depression at eight and anxiety at five.  He is interested in follow up with [facility] for SA treatment. Wynn Banker 04/02/2012, 11:46 AM

## 2012-04-02 NOTE — Progress Notes (Signed)
D: Patient in bed on first approach.  Patient stated at this time that he was tired and not going to wrap up group.  Patient woke up after group and stated he had a bad day because talked to his girlfriend on the phone and she was drunk.  Patient states he cannot take it and he is upset with her and he is not going back to her but he states he loves her.  Patient states due to the conversation with his girlfriend he is down and sad.  Patient rates depression and anxiety high.  Patient states he is having passive SI but verbally contracts for safety.  Patient denies HI but denies AVH. A: Staff to monitor Q 15 mins for safety.  Encouragement and support offered. Scheduled medications administered per orders. Sudafed and vistaril administered prn. R: Patient remains safe on the unit.  Patient did not attend group.  Patient calm, cooperative, and taking administered medications.

## 2012-04-02 NOTE — Progress Notes (Signed)
[facility] MD Progress Note  04/02/2012 11:16 AM Edward [name]  MRN:  [id number] Subjective:  Patient continues to be depressed, states he feels slightly better, current medication regimen helping. Has suicidal thoughts on and off, but able to contract for safety.  Diagnosis:   Axis I: Depressive Disorder NOS Axis II: Deferred Axis III:  Past Medical History  Diagnosis Date  . Thyroid disease   . Hepatitis   . Neuromuscular disorder     pinched nerve  . Hypothyroidism   . Mental disorder   . Depression    Axis IV: economic problems, housing problems and occupational problems Axis V: 51-60 moderate symptoms  ADL's:  Intact  Sleep: Fair  Appetite:  Fair  Psychiatric Specialty Exam: Review of Systems  Constitutional: Negative.   HENT: Positive for congestion.   Eyes: Negative.   Respiratory: Positive for cough.   Cardiovascular: Negative.   Gastrointestinal: Negative.   Genitourinary: Negative.   Musculoskeletal: Positive for joint pain.       Right leg injury, limps.  Skin: Negative.   Neurological: Negative.   Endo/Heme/Allergies: Negative.   Psychiatric/Behavioral: Positive for depression and suicidal ideas. The patient is nervous/anxious.     Blood pressure 136/93, pulse 69, temperature 97.8 F (36.6 C), temperature source Oral, resp. rate 18, height 5' 7.5" (1.715 m), weight 78.926 kg (174 lb).Body mass index is 26.85 kg/(m^2).  General Appearance: Casual  Eye Contact::  Fair  Speech:  Clear and Coherent  Volume:  Normal  Mood:  Anxious and Depressed  Affect:  Constricted  Thought Process:  Coherent  Orientation:  Full (Time, Place, and Person)  Thought Content:  WDL  Suicidal Thoughts:  Yes.  without intent/plan  Homicidal Thoughts:  No  Memory:  Immediate;   Fair Recent;   Fair Remote;   Fair  Judgement:  Fair  Insight:  Present  Psychomotor Activity:  Decreased  Concentration:  Fair  Recall:  Fair  Akathisia:  No  Handed:  Right  AIMS (if indicated):      Assets:  Communication Skills Desire for Improvement  Sleep:  Number of Hours: 7    Current Medications: Current Facility-Administered Medications  Medication Dose Route Frequency Provider Last Rate Last Dose  . acetaminophen (TYLENOL) tablet 650 mg  650 mg Oral Q6H PRN Shuvon Rankin, NP   650 mg at 04/01/12 1811  . alum & mag hydroxide-simeth (MAALOX/MYLANTA) 200-200-20 MG/5ML suspension 30 mL  30 mL Oral Q4H PRN Shuvon Rankin, NP      . ARIPiprazole (ABILIFY) tablet 2 mg  2 mg Oral Q2000 Sanjuana Kava, NP   2 mg at 04/01/12 1948  . desvenlafaxine (PRISTIQ) 24 hr tablet 50 mg  50 mg Oral Daily Sanjuana Kava, NP   50 mg at 04/02/12 0753  . FLUoxetine (PROZAC) capsule 30 mg  30 mg Oral Daily Sanjuana Kava, NP   30 mg at 04/02/12 0752  . gabapentin (NEURONTIN) capsule 600 mg  600 mg Oral TID Shuvon Rankin, NP   600 mg at 04/02/12 0753  . hydrOXYzine (ATARAX/VISTARIL) tablet 50 mg  50 mg Oral Q2000 Sanjuana Kava, NP   50 mg at 04/01/12 1948  . levothyroxine (SYNTHROID, LEVOTHROID) tablet 100 mcg  100 mcg Oral Q0600 Shuvon Rankin, NP   100 mcg at 04/02/12 0612  . magnesium hydroxide (MILK OF MAGNESIA) suspension 30 mL  30 mL Oral Daily PRN Shuvon Rankin, NP      . mirtazapine (REMERON) tablet 15 mg  15  mg Oral QHS Shuvon Rankin, NP   15 mg at 04/01/12 2139  . multivitamin with minerals tablet 1 tablet  1 tablet Oral Daily Shuvon Rankin, NP   1 tablet at 04/02/12 0753  . naproxen (NAPROSYN) tablet 500 mg  500 mg Oral BID PRN Shuvon Rankin, NP   500 mg at 04/02/12 0612  . nicotine (NICODERM CQ - dosed in mg/24 hours) patch 21 mg  21 mg Transdermal Daily Rachael Fee, MD   21 mg at 04/02/12 1610  . pseudoephedrine (SUDAFED) tablet 30 mg  30 mg Oral Q4H PRN Sanjuana Kava, NP   30 mg at 04/02/12 9604  . thiamine (VITAMIN B-1) tablet 100 mg  100 mg Oral Daily Shuvon Rankin, NP   100 mg at 04/02/12 0753  . [DISCONTINUED] ARIPiprazole (ABILIFY) tablet 5 mg  5 mg Oral Q2000 Sanjuana Kava, NP      .  [DISCONTINUED] FLUoxetine (PROZAC) capsule 20 mg  20 mg Oral Daily Sanjuana Kava, NP   20 mg at 04/01/12 0846  . [DISCONTINUED] risperiDONE (RISPERDAL) tablet 0.5 mg  0.5 mg Oral QHS Alvis Edgell, MD   0.5 mg at 03/31/12 2126  . [DISCONTINUED] risperiDONE (RISPERDAL) tablet 0.5 mg  0.5 mg Oral Once Verne Spurr, PA-C      . [DISCONTINUED] traZODone (DESYREL) tablet 100 mg  100 mg Oral QHS Sanjuana Kava, NP        Lab Results: No results found for this or any previous visit (from the past 48 hour(s)).  Physical Findings: AIMS: Facial and Oral Movements Muscles of Facial Expression: None, normal Lips and Perioral Area: None, normal Jaw: None, normal Tongue: None, normal,Extremity Movements Upper (arms, wrists, hands, fingers): None, normal Lower (legs, knees, ankles, toes): None, normal, Trunk Movements Neck, shoulders, hips: None, normal, Overall Severity Severity of abnormal movements (highest score from questions above): None, normal Incapacitation due to abnormal movements: None, normal Patient's awareness of abnormal movements (rate only patient's report): No Awareness, Dental Status Current problems with teeth and/or dentures?: No Does patient usually wear dentures?: No  CIWA:  CIWA-Ar Total: 4  COWS:     Treatment Plan Summary: Daily contact with patient to assess and evaluate symptoms and progress in treatment Medication management  Plan: Continue current plan of care. Increase Prozac to 40mg  po qd. Discontinue Pristiq.  Medical Decision Making Problem Points:  Established problem, stable/improving (1), Review of last therapy session (1) and Review of psycho-social stressors (1) Data Points:  Review of medication regiment & side effects (2)  I certify that inpatient services furnished can reasonably be expected to improve the patient's condition.   [name] 04/02/2012, 11:16 AM

## 2012-04-03 MED ORDER — CIPROFLOXACIN HCL 250 MG PO TABS
250.0000 mg | ORAL_TABLET | Freq: Two times a day (BID) | ORAL | Status: DC
  Administered 2012-04-03 – 2012-04-09 (×13): 250 mg via ORAL
  Filled 2012-04-03 (×15): qty 1

## 2012-04-03 NOTE — Progress Notes (Signed)
D: Patient in bed on approach.  Patient states he is having passive SI but verbally contracts for safety. Patient states his passive SI is still there but decreasing.  Patient denies HI and denies AVH.  Patient states he has been does today because he broke up with his girlfriend.  Patient states he has known for a long time that he loves his girlfriend but he states he needs to leave her.  A: Staff to monitor Q 15 mins for safety.  Encouragement and support offered.  Scheduled medicaitons administered per orders.  Naproxen administer prn for right knee and sudafed administered for congestion. R: Patient remains safe on the unit.  Patient calm, cooperative and visible on th unit.  Patient taking administered medications.

## 2012-04-03 NOTE — Progress Notes (Signed)
[facility] LCSW Aftercare Discharge Planning Group Note  04/03/2012  1:15  Participation Quality:  Appropriate and Attentive  Affect:  Appropriate  Cognitive:  Appropriate  Insight:  Engaged  Engagement in Group:  Engaged  Modes of Intervention:  Problem-solving and Support  Summary of Progress/Problems:  Edward [name] reports being SI but not intent to act on thoughts.  He rates depression and anxiety at ten today.  He shared he spoke with girlfriend last night and it has pulled him down.  Edward [name] 04/03/2012, 2:21 PM

## 2012-04-03 NOTE — Progress Notes (Signed)
[facility] LCSW Group Therapy     Emotional Regulation 1:15 - 2:30 PM           04/03/2012 2:25 PM  Type of Therapy:  Group Therapy  Participation Level:  Active  Participation Quality:  Appropriate and Attentive  Affect:  Appropriate  Cognitive:  Alert and Appropriate  Insight:  Engaged  Engagement in Therapy:  Engaged  Modes of Intervention:  Education, Exploration, Problem-solving and Support  Summary of Progress/Problems:  Patient shared he seems to worry a lot.  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  Wynn Banker 04/03/2012, 2:25 PM

## 2012-04-03 NOTE — Progress Notes (Signed)
D: Patient denies HI and A/V hallucinations and admits to some on and off thoughts of SI; complaints of congestion  A: Monitored q 15 minutes; patient encouraged to attend groups; patient educated about medications; patient given medications per physician orders; patient encouraged to express feelings and/or concerns  R: Patient is cooperative but sad in affect ; patient's interaction with staff and peers is appropriate; patient was able to set goal to talk with staff 1:1 when having feelings of SI; patient is taking medications as prescribed and tolerating medications; patient is attending most groups

## 2012-04-03 NOTE — Progress Notes (Signed)
[facility] MD Progress Note  04/03/2012 11:44 AM Edward [name]  MRN:  [id number] Subjective:  Patient complaining of headache, congestion. Complaining of green discharge. Endorsing depressed mood, suicidal thoughts, able to contract for safety.  Diagnosis:   Axis I: Depressive Disorder NOS Axis II: No diagnosis Axis III:  Past Medical History  Diagnosis Date  . Thyroid disease   . Hepatitis   . Neuromuscular disorder     pinched nerve  . Hypothyroidism   . Mental disorder   . Depression    Axis IV: housing problems and occupational problems Axis V: 51-60 moderate symptoms  ADL's:  Intact  Sleep: Fair  Appetite:  Fair  Suicidal Ideation:    Psychiatric Specialty Exam: Review of Systems  Constitutional: Negative.   HENT: Positive for congestion.   Eyes: Negative.   Respiratory: Positive for sputum production.   Cardiovascular: Negative.   Gastrointestinal: Negative.   Genitourinary: Negative.   Musculoskeletal: Positive for myalgias.  Skin: Negative.   Neurological: Negative.   Endo/Heme/Allergies: Negative.   Psychiatric/Behavioral: Positive for depression and suicidal ideas.    Blood pressure 147/91, pulse 71, temperature 97.3 F (36.3 C), temperature source Oral, resp. rate 18, height 5' 7.5" (1.715 m), weight 78.926 kg (174 lb).Body mass index is 26.85 kg/(m^2).  General Appearance: Casual  Eye Contact::  Fair  Speech:  Slow  Volume:  Normal  Mood:  Depressed, Dysphoric and Irritable  Affect:  Constricted  Thought Process:  Coherent  Orientation:  Full (Time, Place, and Person)  Thought Content:  WDL  Suicidal Thoughts:  Yes.  without intent/plan  Homicidal Thoughts:  No  Memory:  Immediate;   Fair Recent;   Fair Remote;   Fair  Judgement:  Fair  Insight:  Present  Psychomotor Activity:  Decreased  Concentration:  Fair  Recall:  Fair  Akathisia:  No  Handed:  Right  AIMS (if indicated):     Assets:  Communication Skills Desire for Improvement  Sleep:   Number of Hours: 6.5    Current Medications: Current Facility-Administered Medications  Medication Dose Route Frequency Provider Last Rate Last Dose  . acetaminophen (TYLENOL) tablet 650 mg  650 mg Oral Q6H PRN Shuvon Rankin, NP   650 mg at 04/01/12 1811  . alum & mag hydroxide-simeth (MAALOX/MYLANTA) 200-200-20 MG/5ML suspension 30 mL  30 mL Oral Q4H PRN Shuvon Rankin, NP      . ARIPiprazole (ABILIFY) tablet 2 mg  2 mg Oral Q2000 Sanjuana Kava, NP   2 mg at 04/02/12 2118  . ciprofloxacin (CIPRO) tablet 250 mg  250 mg Oral BID Enrika Aguado, MD      . FLUoxetine (PROZAC) capsule 40 mg  40 mg Oral Daily Goddess Gebbia, MD   40 mg at 04/03/12 0800  . gabapentin (NEURONTIN) capsule 600 mg  600 mg Oral TID Shuvon Rankin, NP   600 mg at 04/03/12 0759  . hydrOXYzine (ATARAX/VISTARIL) tablet 50 mg  50 mg Oral Q2000 Sanjuana Kava, NP   50 mg at 04/02/12 2043  . levothyroxine (SYNTHROID, LEVOTHROID) tablet 100 mcg  100 mcg Oral Q0600 Shuvon Rankin, NP   100 mcg at 04/03/12 9629  . magnesium hydroxide (MILK OF MAGNESIA) suspension 30 mL  30 mL Oral Daily PRN Shuvon Rankin, NP      . mirtazapine (REMERON) tablet 15 mg  15 mg Oral QHS Shuvon Rankin, NP   15 mg at 04/02/12 2118  . multivitamin with minerals tablet 1 tablet  1 tablet Oral Daily  Shuvon Rankin, NP   1 tablet at 04/03/12 0800  . naproxen (NAPROSYN) tablet 500 mg  500 mg Oral BID PRN Shuvon Rankin, NP   500 mg at 04/03/12 0823  . nicotine (NICODERM CQ - dosed in mg/24 hours) patch 21 mg  21 mg Transdermal Daily Rachael Fee, MD   21 mg at 04/03/12 1610  . pseudoephedrine (SUDAFED) tablet 30 mg  30 mg Oral Q4H PRN Sanjuana Kava, NP   30 mg at 04/03/12 9604  . sodium chloride (OCEAN) 0.65 % nasal spray 1 spray  1 spray Each Nare PRN [name], NP   1 spray at 04/02/12 1517  . thiamine (VITAMIN B-1) tablet 100 mg  100 mg Oral Daily Shuvon Rankin, NP   100 mg at 04/03/12 0800    Lab Results: No results found for this or any previous visit  (from the past 48 hour(s)).  Physical Findings: AIMS: Facial and Oral Movements Muscles of Facial Expression: None, normal Lips and Perioral Area: None, normal Jaw: None, normal Tongue: None, normal,Extremity Movements Upper (arms, wrists, hands, fingers): None, normal Lower (legs, knees, ankles, toes): None, normal, Trunk Movements Neck, shoulders, hips: None, normal, Overall Severity Severity of abnormal movements (highest score from questions above): None, normal Incapacitation due to abnormal movements: None, normal Patient's awareness of abnormal movements (rate only patient's report): No Awareness, Dental Status Current problems with teeth and/or dentures?: No Does patient usually wear dentures?: No  CIWA:  CIWA-Ar Total: 4  COWS:     Treatment Plan Summary: Daily contact with patient to assess and evaluate symptoms and progress in treatment Medication management  Plan: Continue current plan of care. Start Cipro to address sinus infection. PLan for discharge over next 1 to 2 days.  Medical Decision Making Problem Points:  Established problem, stable/improving (1), Review of last therapy session (1) and Review of psycho-social stressors (1) Data Points:  Review of medication regiment & side effects (2) Review of new medications or change in dosage (2)  I certify that inpatient services furnished can reasonably be expected to improve the patient's condition.   Edward [name] 04/03/2012, 11:44 AM

## 2012-04-03 NOTE — Tx Team (Signed)
Interdisciplinary Treatment Plan Update (Adult)  Date:  04/03/2012  Time Reviewed:  9:56 AM   Progress in Treatment: Attending groups:   Yes   Participating in groups:  Yes Taking medication as prescribed:  Yes Tolerating medication:  Yes Family/Significant othe contact made: Contact made with family Patient understands diagnosis:  Yes Discussing patient identified problems/goals with staff: Yes Medical problems stabilized or resolved: Yes Denies suicidal/homicidal ideation:Yes Issues/concerns per patient self-inventory:  Other:   New problem(s) identified:  Reason for Continuation of Hospitalization: Anxiety Depression Medication stabilization Suicidal ideation  Interventions implemented related to continuation of hospitalization:  Medication Management; safety checks q 15 mins  Additional comments:  Estimated length of stay:  2-3 days  Discharge Plan:  Home with outpatient follow up  New goal(s):  Review of initial/current patient goals per problem list:    1.  Goal(s): Eliminate SI/other thoughts of self harm   Met:  No  Target date: d/c  As evidenced by: Patient will no longer endorse SI/HI or other thoughts of self harm.    2.  Goal (s):Reduce depression/anxiety   Met: Yes  Target date: d/c  As evidenced by: Patient currently rating symptoms at four or below    3.  Goal(s):.stabilize on meds   Met:  No  Target date: d/c  As evidenced by: Patient will report being stabilized on medications - less symptomatic    4.  Goal(s): Refer for outpatient follow up   Met:  Yes  Target date: d/c  As evidenced by: Follow up appointment scheduled    Attendees: Patient:   04/03/2012 9:56 AM  Physican:  Patrick North, MD 04/03/2012 9:56 AM  Nursing:  Harold Barban, RN 04/03/2012 9:56 AM   Nursing:   Leighton Parody, RN 04/03/2012 9:56 AM   Clinical Social Worker:  Juline Patch, LCSW 04/03/2012 9:56 AM   Other: Serena Colonel, FNP 04/03/2012  9:56 AM   Other:  Patton Salles, Clinical Social Worker, LCSW  04/03/2012 9:56 AM Other:        04/03/2012 9:56 AM

## 2012-04-03 NOTE — Progress Notes (Signed)
[facility] Group Notes:  (Counselor/Nursing/MHT/Case Management/Adjunct)  04/03/2012 4:30 PM  Type of Therapy:  Psychoeducational Skills  Participation Level:  Did Not Attend  Summary of Progress/Problems: Nuel did not attend psychoeducational group on labels.   Wandra Scot 04/03/2012, 4:30 PM

## 2012-04-04 NOTE — Progress Notes (Signed)
Psychoeducational Group Note  Date:  04/04/2012 Time:  1000  Group Topic/Focus:  Overcoming Stress:   The focus of this group is to define stress and help patients assess their triggers.  Participation Level: Did Not Attend  Participation Quality:  Not Applicable  Affect:  Not Applicable  Cognitive:  Not Applicable  Insight:  Not Applicable  Engagement in Group: Not Applicable  Additional Comments:  Patient did not attend group. Patient remained in bed.  Karleen Hampshire Brittini 04/04/2012, 1:27 PM

## 2012-04-04 NOTE — Progress Notes (Signed)
D:  Patient stayed in bed all day.  He refused to get up for medications this morning then became angry when he came to the window at noon.  He did eventually calm down and come get his medications, but missed a dose of gabapentin due to the morning refusal.  He did not attend any groups today and did not fill out a self inventory form.  A:  Explained to patient that we have scheduled medication times for a reason and that we expect patients to get up and attend groups during the day.   R:  Irritable this morning, but calm and cooperative this afternoon despite refusing all groups.  No interaction with peers.

## 2012-04-04 NOTE — Progress Notes (Signed)
Patient resting quietly with eyes closed. Respirations even and unlabored. No distress noted . Q 15 minute check continues as ordered to maintain safety. 

## 2012-04-04 NOTE — Progress Notes (Signed)
[facility] MD Progress Note  04/04/2012 12:06 PM Edward [name]  MRN:  [id number] Subjective:  Patient reports feeling poorly. Endorsing depressed mood, suicidal thoughts, lack of energy. Diagnosis:   Axis I: Alcohol Abuse and Depressive Disorder NOS Axis II: No diagnosis Axis III:  Past Medical History  Diagnosis Date  . Thyroid disease   . Hepatitis   . Neuromuscular disorder     pinched nerve  . Hypothyroidism   . Mental disorder   . Depression    Axis IV: economic problems, housing problems and occupational problems Axis V: 51-60 moderate symptoms  ADL's:  Intact  Sleep: Fair  Appetite:  Fair   Psychiatric Specialty Exam: Review of Systems  Constitutional: Negative.   HENT: Positive for congestion and sore throat.   Eyes: Negative.   Respiratory: Positive for sputum production.   Cardiovascular: Negative.   Gastrointestinal: Negative.   Genitourinary: Negative.   Musculoskeletal: Positive for joint pain.  Skin: Negative.   Neurological: Negative.   Endo/Heme/Allergies: Negative.   Psychiatric/Behavioral: Positive for depression and suicidal ideas.    Blood pressure 133/93, pulse 56, temperature 98 F (36.7 C), temperature source Oral, resp. rate 20, height 5' 7.5" (1.715 m), weight 78.926 kg (174 lb).Body mass index is 26.85 kg/(m^2).  General Appearance: Casual  Eye Contact::  Minimal  Speech:  Slow  Volume:  Decreased  Mood:  Depressed and Dysphoric  Affect:  Constricted and Flat  Thought Process:  Coherent  Orientation:  Full (Time, Place, and Person)  Thought Content:  WDL  Suicidal Thoughts:  Yes.  without intent/plan  Homicidal Thoughts:  No  Memory:  Immediate;   Fair Recent;   Fair Remote;   Fair  Judgement:  Fair  Insight:  Fair  Psychomotor Activity:  Normal  Concentration:  Fair  Recall:  Fair  Akathisia:  No  Handed:  Right  AIMS (if indicated):     Assets:  Communication Skills  Sleep:  Number of Hours: 7    Current Medications: Current  Facility-Administered Medications  Medication Dose Route Frequency Provider Last Rate Last Dose  . acetaminophen (TYLENOL) tablet 650 mg  650 mg Oral Q6H PRN Shuvon Rankin, NP   650 mg at 04/03/12 1729  . alum & mag hydroxide-simeth (MAALOX/MYLANTA) 200-200-20 MG/5ML suspension 30 mL  30 mL Oral Q4H PRN Shuvon Rankin, NP      . ARIPiprazole (ABILIFY) tablet 2 mg  2 mg Oral Q2000 Sanjuana Kava, NP   2 mg at 04/03/12 1945  . ciprofloxacin (CIPRO) tablet 250 mg  250 mg Oral BID Sandrea Boer, MD   250 mg at 04/03/12 1945  . FLUoxetine (PROZAC) capsule 40 mg  40 mg Oral Daily Lenee Franze, MD   40 mg at 04/03/12 0800  . gabapentin (NEURONTIN) capsule 600 mg  600 mg Oral TID Shuvon Rankin, NP   600 mg at 04/03/12 1726  . hydrOXYzine (ATARAX/VISTARIL) tablet 50 mg  50 mg Oral Q2000 Sanjuana Kava, NP   50 mg at 04/03/12 1945  . levothyroxine (SYNTHROID, LEVOTHROID) tablet 100 mcg  100 mcg Oral Q0600 Shuvon Rankin, NP   100 mcg at 04/04/12 9562  . magnesium hydroxide (MILK OF MAGNESIA) suspension 30 mL  30 mL Oral Daily PRN Shuvon Rankin, NP      . mirtazapine (REMERON) tablet 15 mg  15 mg Oral QHS Shuvon Rankin, NP   15 mg at 04/03/12 2106  . multivitamin with minerals tablet 1 tablet  1 tablet Oral Daily Shuvon Rankin,  NP   1 tablet at 04/03/12 0800  . naproxen (NAPROSYN) tablet 500 mg  500 mg Oral BID PRN Shuvon Rankin, NP   500 mg at 04/03/12 2106  . nicotine (NICODERM CQ - dosed in mg/24 hours) patch 21 mg  21 mg Transdermal Daily Rachael Fee, MD   21 mg at 04/04/12 [phone]  . pseudoephedrine (SUDAFED) tablet 30 mg  30 mg Oral Q4H PRN Sanjuana Kava, NP   30 mg at 04/03/12 2135  . sodium chloride (OCEAN) 0.65 % nasal spray 1 spray  1 spray Each Nare PRN [name], NP   1 spray at 04/02/12 1517  . thiamine (VITAMIN B-1) tablet 100 mg  100 mg Oral Daily Shuvon Rankin, NP   100 mg at 04/03/12 0800    Lab Results: No results found for this or any previous visit (from the past 48  hour(s)).  Physical Findings: AIMS: Facial and Oral Movements Muscles of Facial Expression: None, normal Lips and Perioral Area: None, normal Jaw: None, normal Tongue: None, normal,Extremity Movements Upper (arms, wrists, hands, fingers): None, normal Lower (legs, knees, ankles, toes): None, normal, Trunk Movements Neck, shoulders, hips: None, normal, Overall Severity Severity of abnormal movements (highest score from questions above): None, normal Incapacitation due to abnormal movements: None, normal Patient's awareness of abnormal movements (rate only patient's report): No Awareness, Dental Status Current problems with teeth and/or dentures?: No Does patient usually wear dentures?: No  CIWA:  CIWA-Ar Total: 4  COWS:     Treatment Plan Summary: Daily contact with patient to assess and evaluate symptoms and progress in treatment Medication management  Plan: Continue current plan of care.   Medical Decision Making Problem Points:  Established problem, worsening (2), Review of last therapy session (1) and Review of psycho-social stressors (1) Data Points:  Review of medication regiment & side effects (2)  I certify that inpatient services furnished can reasonably be expected to improve the patient's condition.   Dania Marsan 04/04/2012, 12:06 PM

## 2012-04-04 NOTE — Progress Notes (Signed)
D: Patient cooperative and calm on approach.  Patient states he had a bad say because he did not get his 8 am medication until noon.  Writer stated to the patient it was reported that he id not come to the medication window to get his medications and stated he did not want them.  Patient then stated, "I never said I didn't want them, I said I did not want to get up to come get them.  It is the nurses job to give me my scheduled medications so just like she walked down here to tell me to get up she could walk down here with my medications."  Patient expresses he does not think he is getting the proper care he needs here and states he needs to be transferred to another facility.  Patient passive SI denies HI and denies AVH. A: Staff to monitor Q 15 mins for safety.  Encouragement and support offered.  Patient was encouraged to come to the medication window for medications especially since he walked down to breakfast.  Patient was told that he has to be respectful to staff.  Scheduled medications administered R: Patient remains safe on the unit.  Patient did not attend group tonight.  Patient taking medications.

## 2012-04-04 NOTE — Progress Notes (Addendum)
Psychoeducational Group Note  Date:  04/04/2012 Time:  1100  Group Topic/Focus:  Wellness Toolbox:   The focus of this group is to discuss various aspects of wellness, balancing those aspects and exploring ways to increase the ability to experience wellness.  Patients will create a wellness toolbox for use upon discharge.  Participation Level:  Did not attend  Participation Quality:  Did not attend Affect:  Did not attend  Cognitive:  Did not attend  Insight:  Did not attend  Engagement in Group:  Did not attend  Additional Comments:  Did not attend  [name] 04/04/2012, 4:21 PM

## 2012-04-04 NOTE — Progress Notes (Signed)
Psychoeducational Group Note  Date:  04/04/2012 Time:  2000   Group Topic/Focus:  Karaoke  Participation Level:  Did Not Attend  Participation Quality:    Affect:    Cognitive:    Insight:    Engagement in Group:    Additional Comments:    Humberto Seals Monique 04/04/2012, 10:22 PM

## 2012-04-04 NOTE — Progress Notes (Signed)
[facility] LCSW Group Therapy      Living a Balanced Life  1:15-2:30       04/04/2012 3:15 PM  Type of Therapy:  Group Therapy  Participation Level:  Did Not Attend  Participation Quality:    Affect:    Cognitive:    Insight:    Engagement in Therapy:    Modes of Intervention:    Summary of Progress/Problems:  Patient did not attend group.  Edward [name] 04/04/2012, 3:15 PM

## 2012-04-05 MED ORDER — IBUPROFEN 800 MG PO TABS
800.0000 mg | ORAL_TABLET | Freq: Three times a day (TID) | ORAL | Status: DC | PRN
  Administered 2012-04-05 – 2012-04-08 (×8): 800 mg via ORAL
  Filled 2012-04-05 (×8): qty 1

## 2012-04-05 MED ORDER — FLUOXETINE HCL 20 MG PO CAPS
60.0000 mg | ORAL_CAPSULE | Freq: Every day | ORAL | Status: DC
  Administered 2012-04-06 – 2012-04-09 (×4): 60 mg via ORAL
  Filled 2012-04-05 (×5): qty 3

## 2012-04-05 NOTE — Progress Notes (Signed)
[facility] Group Notes:  (Counselor/Nursing/MHT/Case Management/Adjunct)  04/05/2012 11:55 PM  Type of Therapy:  Group Therapy  Participation Level:  Active  Participation Quality:  Appropriate  Affect:  Appropriate  Cognitive:  Appropriate  Insight:  Engaged  Engagement in Group:  Engaged  Engagement in Therapy:  Engaged  Modes of Intervention:  Geophysicist/field seismologist of Progress/Problems: Pt. Stated his goal today is to work on his depression. Pt. Stated he did so by getting out of his bed and room to interact with other in the dayroom.  Pt. Stated he felt good about his progress and it was helpful talking to others.   Sondra Come 04/05/2012, 11:55 PM

## 2012-04-05 NOTE — Progress Notes (Signed)
Psychoeducational Group Note  Date:  04/05/2012 Time:  1100  Group Topic/Focus:  Relapse Prevention Planning:   The focus of this group is to define relapse and discuss the need for planning to combat relapse.  Participation Level: Did Not Attend  Participation Quality:  Not Applicable  Affect:  Not Applicable  Cognitive:  Not Applicable  Insight:  Not Applicable  Engagement in Group: Not Applicable  Additional Comments:no note/ pt did not attend  Tex Conroy A 04/05/2012, 12:48 PM

## 2012-04-05 NOTE — Progress Notes (Signed)
D.  Pt. Denies SI/HI and reports hearing a woman's voice for 10 years telling him to hurt his self but he will not.  Pt. Wants to have ECT.  Compliant with medications. A.  Support given R.  Pt. Receptive.

## 2012-04-05 NOTE — Progress Notes (Signed)
Patient ID: Edward [name], male   DOB: May 08, 1960, 51 y.o.   MRN: [id number] D: Pt. Eyes no distress noted. A: pt. Will be monitored q16min for safety. Writer will monitor for s/s distress. A: Pt. Is safe on the unit. No distress noted, resp. Even, unlabored

## 2012-04-05 NOTE — Progress Notes (Signed)
[facility] LCSW Group Therapy        Feelings Around Relapse        1:15-2:30 PM    04/05/2012 2:39 PM  Type of Therapy:  Group Therapy  Participation Level:  Active  Participation Quality:  Appropriate and Attentive  Affect:  Appropriate  Cognitive:  Alert and Appropriate  Insight:  Engaged  Engagement in Therapy:  Engaged  Modes of Intervention:  Education, Exploration, Problem-solving and Support  Summary of Progress/Problems:  Patient shared relapsing for him is not following up with appointments.  He stated he will recognize he is almost out of meds but will not call MD to schedule follow up.  He shared not taking meds leads to SI.  Wynn Banker 04/05/2012, 2:39 PM
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
## 2012-04-06 DIAGNOSIS — F329 Major depressive disorder, single episode, unspecified: Secondary | ICD-10-CM

## 2012-04-06 DIAGNOSIS — F1010 Alcohol abuse, uncomplicated: Secondary | ICD-10-CM
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
## 2012-04-06 NOTE — Progress Notes (Signed)
Psychoeducational Group Note  Date:  04/06/2012 Time:  1030  Group Topic/Focus:  Identifying Needs:   The focus of this group is to help patients identify their personal needs that have been historically problematic and identify healthy behaviors to address their needs.  Participation Level: Did Not Attend  Participation Quality:  Not Applicable  Affect:  Not Applicable  Cognitive:  Not Applicable  Insight:  Not Applicable  Engagement in Group: Not Applicable  Additional Comments:     PD RN [facility]

## 2012-04-06 NOTE — Progress Notes (Signed)
D.  Pt. Reports feeling better this evening than this morning.  Pt. Was given Ibuprofen for pain relief for right knee.  Rated pain as a 7 and when assessed again pt. Rated pain at a 7 again.  Pt. Stated it would help to get off his feet. A.  Pt. Encouraged to elevate right leg.  No swelling or redness noted. R.  Pt. In bed with right leg elevated and reports leg feels better.Marland Kitchen
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
## 2012-04-06 NOTE — Progress Notes (Signed)
Edward [name] has been in his bed all day. HE did not get up at 0700 to go to the cafe to eat his breakfast. HE refused to come to med window for his 0800 meds. HE is agitated, irritable, " just wants to be discharge" and has diff understanding why " you can't just send me to have ECT".   A This nurse spoke with  NP, AN, and she says " he's ok.Marland Kitchen.he wlll not hurt himself". She stated he was not a candidate for ECT ( although he reports he underwent ECT "15" years ago and it helped then.   R Staff took his meds to him in his room and has encouraged pt to get OOB> ( currently it is 1241 ) and pt is sound asleep in his bed.

## 2012-04-06 NOTE — Progress Notes (Signed)
Patient ID: Edward [name], male   DOB: 07-29-1960, 51 y.o.   MRN: [id number] Patient ID: Edward [name], male   DOB: [birth year], 51 y.o.   MRN: [id number] [facility] MD Progress Note  04/06/2012 3:01 PM Akil Hoos  MRN:  [id number]  Subjective:  I got no energy. I'm tired. I dreamt last night that I drowned a kitten. That was not good. I need ECT. If I can't get ECT, discharge me".  Diagnosis:   Axis I: Alcohol Abuse and Depressive Disorder NOS Axis II: No diagnosis Axis III:  Past Medical History  Diagnosis Date  . Thyroid disease   . Hepatitis   . Neuromuscular disorder     pinched nerve  . Hypothyroidism   . Mental disorder   . Depression    Axis IV: economic problems, housing problems and occupational problems Axis V: 51-60 moderate symptoms  ADL's:  Intact  Sleep: Fair Appetite:  Declined to eat breakfast.  Psychiatric Specialty Exam: Review of Systems  Constitutional: Negative.   HENT:       Patient in room on bed with blanket wrapped around eyes.  Eyes: Negative.   Cardiovascular: Negative.   Gastrointestinal: Negative.   Genitourinary: Negative.   Musculoskeletal: Positive for joint pain.  Skin: Negative.   Neurological: Positive for headaches.  Endo/Heme/Allergies: Negative.   Psychiatric/Behavioral: Positive for depression (Rates 10/10) and suicidal ideas. The patient is nervous/anxious (Rates 5/10) and has insomnia.     Blood pressure 140/85, pulse 66, temperature 98 F (36.7 C), temperature source Oral, resp. rate 16, height 5' 7.5" (1.715 m), weight 78.926 kg (174 lb).Body mass index is 26.85 kg/(m^2).  General Appearance: Casual  Eye Contact::  None  Speech:  Slow  Volume:  Decreased  Mood:  Depressed and Dysphoric  Affect:  Constricted and Flat  Thought Process:  Coherent  Orientation:  Full (Time, Place, and Person)  Thought Content:  WDL  Suicidal Thoughts:  Yes.  without intent/plan  Homicidal Thoughts:  No  Memory:  Immediate;   Fair Recent;    Fair Remote;   Fair  Judgement:  Fair  Insight:  Fair  Psychomotor Activity:  Normal  Concentration:  Fair  Recall:  Fair  Akathisia:  No  Handed:  Right  AIMS (if indicated):     Assets:  Communication Skills  Sleep:  Number of Hours: 5.75    Current Medications: Current Facility-Administered Medications  Medication Dose Route Frequency Provider Last Rate Last Dose  . acetaminophen (TYLENOL) tablet 650 mg  650 mg Oral Q6H PRN Shuvon Rankin, NP   650 mg at 04/05/12 0835  . alum & mag hydroxide-simeth (MAALOX/MYLANTA) 200-200-20 MG/5ML suspension 30 mL  30 mL Oral Q4H PRN Shuvon Rankin, NP      . ARIPiprazole (ABILIFY) tablet 2 mg  2 mg Oral Q2000 Sanjuana Kava, NP   2 mg at 04/05/12 2128  . ciprofloxacin (CIPRO) tablet 250 mg  250 mg Oral BID Himabindu Ravi, MD   250 mg at 04/06/12 1044  . FLUoxetine (PROZAC) capsule 60 mg  60 mg Oral Daily Shuvon Rankin, NP   60 mg at 04/06/12 1042  . gabapentin (NEURONTIN) capsule 600 mg  600 mg Oral TID Shuvon Rankin, NP   300 mg at 04/06/12 1045  . hydrOXYzine (ATARAX/VISTARIL) tablet 50 mg  50 mg Oral Q2000 Sanjuana Kava, NP   50 mg at 04/05/12 2015  . ibuprofen (ADVIL,MOTRIN) tablet 800 mg  800 mg Oral Q8H PRN Assunta Found, NP  800 mg at 04/06/12 1050  . levothyroxine (SYNTHROID, LEVOTHROID) tablet 100 mcg  100 mcg Oral Q0600 Shuvon Rankin, NP   100 mcg at 04/06/12 7829  . magnesium hydroxide (MILK OF MAGNESIA) suspension 30 mL  30 mL Oral Daily PRN Shuvon Rankin, NP      . mirtazapine (REMERON) tablet 15 mg  15 mg Oral QHS Shuvon Rankin, NP   15 mg at 04/05/12 2128  . multivitamin with minerals tablet 1 tablet  1 tablet Oral Daily Shuvon Rankin, NP   1 tablet at 04/06/12 1041  . naproxen (NAPROSYN) tablet 500 mg  500 mg Oral BID PRN Shuvon Rankin, NP   500 mg at 04/05/12 0834  . nicotine (NICODERM CQ - dosed in mg/24 hours) patch 21 mg  21 mg Transdermal Daily Rachael Fee, MD   21 mg at 04/06/12 0655  . pseudoephedrine (SUDAFED) tablet 30 mg   30 mg Oral Q4H PRN Sanjuana Kava, NP   30 mg at 04/05/12 1714  . sodium chloride (OCEAN) 0.65 % nasal spray 1 spray  1 spray Each Nare PRN [name], NP   1 spray at 04/02/12 1517  . thiamine (VITAMIN B-1) tablet 100 mg  100 mg Oral Daily Shuvon Rankin, NP   100 mg at 04/06/12 1042    Lab Results: No results found for this or any previous visit (from the past 48 hour(s)).  Physical Findings: AIMS: Facial and Oral Movements Muscles of Facial Expression:  (Pt. eyes closed, no distress noted resp. even) Lips and Perioral Area: None, normal Jaw: None, normal Tongue: None, normal,Extremity Movements Upper (arms, wrists, hands, fingers): None, normal Lower (legs, knees, ankles, toes): None, normal, Trunk Movements Neck, shoulders, hips: None, normal, Overall Severity Severity of abnormal movements (highest score from questions above): None, normal Incapacitation due to abnormal movements: None, normal Patient's awareness of abnormal movements (rate only patient's report): No Awareness, Dental Status Current problems with teeth and/or dentures?: No Does patient usually wear dentures?: No  CIWA:  CIWA-Ar Total: 4  COWS:     Treatment Plan Summary: Daily contact with patient to assess and evaluate symptoms and progress in treatment Medication management  Plan: No changes made on the current treatment regimen, however, See notes per Dr. Daleen Bo below. IMonitor:  (Dr. Daleen Bo) Monitor patient progression if no improvement increase Ability to 5 mg Sunday(04/07/12).    Medical Decision Making Problem Points:  Established problem, worsening (2), Review of last therapy session (1) and Review of psycho-social stressors (1) Data Points:  Review or order clinical lab tests (1) Review and summation of old records (2) Review of medication regiment & side effects (2) Review of new medications or change in dosage (2) Review or order of Psychological tests (1)  I certify that inpatient services  furnished can reasonably be expected to improve the patient's condition.   Armandina Stammer I 04/06/2012, 3:01 PM

## 2012-04-06 NOTE — Progress Notes (Signed)
[facility] Group Notes:  (Counselor/Nursing/MHT/Case Management/Adjunct)  04/06/2012 10:10 PM  Type of Therapy:  Psychoeducational Skills  Participation Level:  Minimal  Participation Quality:  Resistant  Affect:  Depressed  Cognitive:  Appropriate  Insight:  Supportive  Engagement in Group:  Improving  Engagement in Therapy:  Improving  Modes of Intervention:  Education  Summary of Progress/Problems: The patient stated that he had a bad day since he felt like a "real bastard" for much of the day. He explained that he is dealing with his ex-girlfriend over the phone and that the relationship is over with. He didn't feel like getting out of bed initially, but that his peers supported him.    Crue Otero S 04/06/2012, 10:10 PM

## 2012-04-06 NOTE — Progress Notes (Signed)
Goals Group    DNA 

## 2012-04-07 DIAGNOSIS — F339 Major depressive disorder, recurrent, unspecified: Secondary | ICD-10-CM

## 2012-04-07 MED ORDER — ARIPIPRAZOLE 10 MG PO TABS
5.0000 mg | ORAL_TABLET | Freq: Every day | ORAL | Status: DC
  Administered 2012-04-07 – 2012-04-08 (×2): 5 mg via ORAL
  Filled 2012-04-07 (×3): qty 1

## 2012-04-07 NOTE — Progress Notes (Signed)
D: Pt resting in bed, eyes closed, respirations even and unlabored.   A: Will continue to monitor.  R: In no apparent distress, appears to be sleeping better than usual. Dion Saucier RN

## 2012-04-07 NOTE — Progress Notes (Signed)
[facility] MD Progress Note  04/07/2012 11:09 AM Edward [name]  MRN:  [id number] Subjective:  "Not too good." "If breathing were an option I opt to not." "I want ECT." Diagnosis:  Major depressive disorder severe, recurrent  ADL's:  Intact  Sleep: Fair  Appetite:  Poor  Suicidal Ideation:  Yes denies intent, no plan. Feels safe here. Homicidal Ideation:  no AEB (as evidenced by): patient's verbal response, affect. Patient refuses to fill out daily self inventory, as he says 1-10 scales insult his intelligence. Does not attend most groups.  Psychiatric Specialty Exam: ROS  Blood pressure 117/78, pulse 60, temperature 97.9 F (36.6 C), temperature source Oral, resp. rate 20, height 5' 7.5" (1.715 m), weight 78.926 kg (174 lb).Body mass index is 26.85 kg/(m^2).  General Appearance: Fairly Groomed  Patent attorney::  Fair  Speech:  Clear and Coherent  Volume:  Normal  Mood:  Depressed  Affect:  Depressed and Flat  Thought Process:  Goal Directed  Orientation:  Full (Time, Place, and Person)  Thought Content:  Obsessions  Suicidal Thoughts:  Yes.  without intent/plan  Homicidal Thoughts:  No  Memory:  Negative  Judgement:  Intact  Insight:  Present  Psychomotor Activity:  Normal  Concentration:  Fair  Recall:  Fair  Akathisia:  No  Handed:  Right  AIMS (if indicated):     Assets:  Communication Skills  Sleep:  Number of Hours: 5.5    Current Medications: Current Facility-Administered Medications  Medication Dose Route Frequency Provider Last Rate Last Dose  . acetaminophen (TYLENOL) tablet 650 mg  650 mg Oral Q6H PRN Shuvon Rankin, NP   650 mg at 04/05/12 0835  . alum & mag hydroxide-simeth (MAALOX/MYLANTA) 200-200-20 MG/5ML suspension 30 mL  30 mL Oral Q4H PRN Shuvon Rankin, NP      . ARIPiprazole (ABILIFY) tablet 2 mg  2 mg Oral Q2000 Sanjuana Kava, NP   2 mg at 04/06/12 1955  . ciprofloxacin (CIPRO) tablet 250 mg  250 mg Oral BID Himabindu Ravi, MD   250 mg at 04/07/12 0817  .  FLUoxetine (PROZAC) capsule 60 mg  60 mg Oral Daily Shuvon Rankin, NP   60 mg at 04/07/12 0817  . gabapentin (NEURONTIN) capsule 600 mg  600 mg Oral TID Shuvon Rankin, NP   600 mg at 04/07/12 0817  . hydrOXYzine (ATARAX/VISTARIL) tablet 50 mg  50 mg Oral Q2000 Sanjuana Kava, NP   50 mg at 04/06/12 1956  . ibuprofen (ADVIL,MOTRIN) tablet 800 mg  800 mg Oral Q8H PRN Shuvon Rankin, NP   800 mg at 04/07/12 0817  . levothyroxine (SYNTHROID, LEVOTHROID) tablet 100 mcg  100 mcg Oral Q0600 Shuvon Rankin, NP   100 mcg at 04/07/12 0817  . magnesium hydroxide (MILK OF MAGNESIA) suspension 30 mL  30 mL Oral Daily PRN Shuvon Rankin, NP      . mirtazapine (REMERON) tablet 15 mg  15 mg Oral QHS Shuvon Rankin, NP   15 mg at 04/06/12 2119  . multivitamin with minerals tablet 1 tablet  1 tablet Oral Daily Shuvon Rankin, NP   1 tablet at 04/07/12 0816  . naproxen (NAPROSYN) tablet 500 mg  500 mg Oral BID PRN Shuvon Rankin, NP   500 mg at 04/05/12 0834  . nicotine (NICODERM CQ - dosed in mg/24 hours) patch 21 mg  21 mg Transdermal Daily Rachael Fee, MD   21 mg at 04/07/12 0817  . pseudoephedrine (SUDAFED) tablet 30 mg  30 mg  Oral Q4H PRN Sanjuana Kava, NP   30 mg at 04/06/12 1652  . sodium chloride (OCEAN) 0.65 % nasal spray 1 spray  1 spray Each Nare PRN [name], NP   1 spray at 04/02/12 1517  . thiamine (VITAMIN B-1) tablet 100 mg  100 mg Oral Daily Shuvon Rankin, NP   100 mg at 04/07/12 1610    Lab Results: No results found for this or any previous visit (from the past 48 hour(s)).  Physical Findings: AIMS: Facial and Oral Movements Muscles of Facial Expression:  (Pt. eyes closed, no distress noted resp. even) Lips and Perioral Area: None, normal Jaw: None, normal Tongue: None, normal,Extremity Movements Upper (arms, wrists, hands, fingers): None, normal Lower (legs, knees, ankles, toes): None, normal, Trunk Movements Neck, shoulders, hips: None, normal, Overall Severity Severity of abnormal  movements (highest score from questions above): None, normal Incapacitation due to abnormal movements: None, normal Patient's awareness of abnormal movements (rate only patient's report): No Awareness, Dental Status Current problems with teeth and/or dentures?: No Does patient usually wear dentures?: No  CIWA:  CIWA-Ar Total: 4  COWS:     Treatment Plan Summary: Daily contact with patient to assess and evaluate symptoms and progress in treatment Medication management  Plan: 1. Patient states he has no faith in medicine. He reports he has had ECT in the past and would like to have it again. 2. He is requesting an increase in Abilify so this is increased to 5mg . 3. Consider ECT referral.  Medical Decision Making Problem Points:  Established problem, stable/improving (1) Data Points:  Review or order clinical lab tests (1) Review of new medications or change in dosage (2)  I certify that inpatient services furnished can reasonably be expected to improve the patient's condition.    Rona Ravens. Cyril Woodmansee PAC 04/07/2012, 11:09 AM

## 2012-04-07 NOTE — Progress Notes (Deleted)
Psychoeducational Group Note  Date: 04/07/2012 Time: 1015  Group Topic/Focus:  Making Healthy Choices:   The focus of this group is to help patients identify negative/unhealthy choices they were using prior to admission and identify positive/healthier coping strategies to replace them upon discharge.  Participation Level:  Active  Participation Quality:  Appropriate  Affect:  Appropriate  Cognitive:  Appropriate  Insight:  Improving  Engagement in Group:  Improving  Additional Comments:    04/07/2012,1:52 PM Dewanna Hurston, Joie Bimler

## 2012-04-07 NOTE — Progress Notes (Signed)
Psychoeducational Group Note  Date: 04/07/2012 Time: 1015  Group Topic/Focus:  Making Healthy Choices:   The focus of this group is to help patients identify negative/unhealthy choices they were using prior to admission and identify positive/healthier coping strategies to replace them upon discharge.  Participation Level:  Did Not Attend  PAdditional Comments:    04/07/2012,1:43 PM Karima Carrell, Joie Bimler

## 2012-04-07 NOTE — Progress Notes (Signed)
Edward [name] is seen OOB first thing this morning. HE is dressed, bright-eyed, is appropriate and calm in his demeanor as he presents to the med window to take his AM meds.    A He has not attended his groups today as of the writing of this note ( it is [phone])  And has spent the  Afternoon sleeping in his bed.   R Safety is in place and POC includes continuing to foster therapeutic relationship .

## 2012-04-07 NOTE — Clinical Social Work Note (Signed)
[facility] Group Notes:  (Clinical Social Work)  04/07/2012   3:00-4:00PM  Summary of Progress/Problems:   Summary of Progress/Problems:   The main focus of today's process group was for the patient to define "support" and describe what healthy supports are, then to identify the patient's current support system and decide on other supports that can be put in place to prevent future hospitalizations.  Roleplay was used to demonstrate definitions of different types of available supports.  An emphasis was placed on using therapist, doctor, therapy groups, self-help groups and problem-specific support groups to expand supports. Additionally, psychoeducation on various symptoms of mental illness was done at patients' requests.  The patient expressed full understanding of all the subjects discussed this day, and interacted in the discussion about supports as well as explained to other patients his own auditory hallucinations that he has had.  He is anxious to move to the next level of care, and feels good about all the tools he has gained while in the hospital.  Type of Therapy:  Process Group  Participation Level:  Active  Participation Quality:  Appropriate, Attentive and Sharing  Affect:  Appropriate  Cognitive:  Alert, Appropriate and Oriented  Insight:  Engaged  Engagement in Therapy:  Engaged  Modes of Intervention:  Clarification, Education, Limit-setting, Problem-solving, Socialization, Support and Processing, Exploration, Discussion, Role-Play   Ambrose Mantle, LCSW 04/07/2012, 4:36 PM

## 2012-04-08 MED ORDER — HYDROXYZINE HCL 50 MG PO TABS: 50.0000 mg | ORAL_TABLET | Freq: Every day | ORAL | Status: DC

## 2012-04-08 MED ORDER — NAPROXEN 500 MG PO TABS: 500.0000 mg | ORAL_TABLET | Freq: Two times a day (BID) | ORAL | Status: DC | PRN

## 2012-04-08 MED ORDER — CIPROFLOXACIN HCL 250 MG PO TABS: 250.0000 mg | ORAL_TABLET | Freq: Two times a day (BID) | ORAL | Status: DC

## 2012-04-08 MED ORDER — NICOTINE 21 MG/24HR TD PT24: 28.0000 | MEDICATED_PATCH | Freq: Every day | TRANSDERMAL | Status: DC

## 2012-04-08 MED ORDER — LEVOTHYROXINE SODIUM 100 MCG PO TABS: 100.0000 ug | ORAL_TABLET | Freq: Every day | ORAL | Status: DC

## 2012-04-08 MED ORDER — MIRTAZAPINE 15 MG PO TABS: 15.0000 mg | ORAL_TABLET | Freq: Every day | ORAL | Status: DC

## 2012-04-08 MED ORDER — GABAPENTIN 300 MG PO CAPS: 600.0000 mg | ORAL_CAPSULE | Freq: Three times a day (TID) | ORAL | Status: DC

## 2012-04-08 MED ORDER — FLUOXETINE HCL 20 MG PO CAPS: 60.0000 mg | ORAL_CAPSULE | Freq: Every day | ORAL | Status: DC

## 2012-04-08 MED ORDER — ARIPIPRAZOLE 5 MG PO TABS: 5.0000 mg | ORAL_TABLET | Freq: Every day | ORAL | Status: DC

## 2012-04-08 NOTE — Progress Notes (Signed)
[facility] LCSW Aftercare Discharge Planning Group Note       8:30-9:30 AM   04/08/2012 4:04 PM  Participation Quality:  Appropriate and Attentive  Affect:  Appropriate  Cognitive:  Alert and Appropriate  Insight:  Engaged  Engagement in Group:  Engaged  Modes of Intervention:  Discussion, Exploration, Problem-solving and Support  Summary of Progress/Problems:  Patient reported being better and looking forward to discharge tomorrow.  He endorses chronic SI but no intent to act of thoughts if he were outside of the hospital.  He rates depression at seven and hopelessness/helplessness at five.  Wynn Banker 04/08/2012, 4:04 PM

## 2012-04-08 NOTE — Progress Notes (Signed)
Patient ID: Edward [name], male   DOB: 10/23/1960, 51 y.o.   MRN: [id number] Patient reports he slept well and his appetite is good.  His energy level is normal and his ability to pay attention is improving.  He rates his depression an 8 and his hopelessness a 5.  Patient says he has never felt good, but he feels as well now as he ever has.  He is chronically suicidal but says he won't act on it.  He is looking forward to going to [facility] at 7 am for his interview and then contacting Edward [name] with [facility]

## 2012-04-08 NOTE — Tx Team (Signed)
Interdisciplinary Treatment Plan Update (Adult)  Date:  04/08/2012  Time Reviewed:  10:18 AM   Progress in Treatment: Attending groups:   Yes   Participating in groups:  Yes Taking medication as prescribed:  Yes Tolerating medication:  Yes Family/Significant othe contact made: Contact made with family Patient understands diagnosis:  Yes Discussing patient identified problems/goals with staff: Yes Medical problems stabilized or resolved: Yes Denies suicidal/homicidal ideation:Yes Issues/concerns per patient self-inventory:  Other:   New problem(s) identified:  Reason for Continuation of Hospitalization:  Depression Medication stabilization Suicidal ideation  Interventions implemented related to continuation of hospitalization:  Medication Management; safety checks q 15 mins  Additional comments:  Estimated length of stay:  1 day  Discharge Plan:  Patient to go to [facility]  New goal(s):  Review of initial/current patient goals per problem list:    1.  Goal(s): Eliminate SI/other thoughts of self harm   Met:  No  Target date: d/c  As evidenced by: Patient no longer endorsing SI/HI or other thoughts of self harm.    2.  Goal (s):Reduce depression/anxiety  Met: Yes  Target date: d/c  As evidenced by: Patient currently rating symptoms at four or below    3.  Goal(s):.stabilize on meds   Met:  No  Target date: d/c  As evidenced by: Patient reports being stabilized on medications - less symptomatic    4.  Goal(s): Refer for outpatient follow up   Met:  Yes  Target date: d/c  As evidenced by: Follow up appointment scheduled    Attendees: Patient:  Edward [name] 04/08/2012 10:18 AM  Physican:  Patrick North, MD 04/08/2012 10:18 AM  Nursing:  Neill Loft, RN 04/08/2012 10:18 AM   Nursing:   Quintella Reichert, RN 04/08/2012 10:18 AM   Clinical Social Worker:  Juline Patch, LCSW 04/08/2012 10:18 AM   Other: Serena Colonel, FNP  04/08/2012 10:18 AM   Other:    [place] [employer]ers     04/08/2012 10:18 AM Other:        04/08/2012 10:18 AM

## 2012-04-08 NOTE — Progress Notes (Signed)
[facility] LCSW Group Therapy        Overcoming Obstacles 1:15 2:30 PM         04/08/2012 4:06 PM  Type of Therapy:  Group Therapy  Participation Level:  Active  Participation Quality:  Appropriate  Affect:  Appropriate  Cognitive:  Alert and Appropriate  Insight:  Engaged  Engagement in Therapy:  Engaged  Modes of Intervention:  Exploration, Problem-solving and Support Discussion, Education  Summary of Progress/Problems:  Patient shared an obstacle to him is something that has to be overcome or adapted to.  He shared with addiction he has to do both.  Wynn Banker 04/08/2012, 4:06 PM

## 2012-04-08 NOTE — Progress Notes (Signed)
D: Pt had no complaints to report.  Facial expression animated with good eye contact.  Affect somewhat preoccupied with labile mood, ranging from depressed to hopeful based on situational factors.  Very verbal in wrapup group- stated he is "at a pivotal point- I woke up this morning wishing I could stop breathing.  The PA and the people here gave me hope and I don't feel that way now" and "I care now".  Speech logical and coherent; no evidence of disorganized thought process or content.  Approach to interaction was assertive but respectful.  Denied SI/HI, AVH, and acute pain.  Contracting for safety on unit.  Pt has appointment tomorrow at [facility] regarding admission to a 28-day program.    A: Pt offered verbal support and encouragement to continue to use his peers for discussing newly occurring ideas.  Also encouraged to approach staff for support.  No PRNs given.  All medications administered according to med orders and POC.  Q15 minute safety checks maintained as per unit policy. Abilify dose to increase to 5mg  on 12/16 am.  R: Pt cooperative and motivated at this time.  Safety maintained. Dion Saucier RN

## 2012-04-08 NOTE — Progress Notes (Signed)
[facility] Group Notes:  (Counselor/Nursing/MHT/Case Management/Adjunct)  04/08/2012 12:38 AM  Type of Therapy:  Psychoeducational Skills  Participation Level:  Active  Participation Quality:  Appropriate  Affect:  Blunted  Cognitive:  Appropriate  Insight:  Engaged  Engagement in Group:  Engaged  Engagement in Therapy:  Engaged  Modes of Intervention:  Education  Summary of Progress/Problems: The patient mentioned for the second day in row that he woke up this morning not feeling as if he wanted to take another breath. He states that his medication was changed and that he feels a little bit better. His affect brightened when he verbalized that he has an appointment on Tuesday morning for a 28 day program. His goal for tomorrow is to begin to address his discharge plans.    Edward [name] 04/08/2012, 12:38 AM

## 2012-04-08 NOTE — Progress Notes (Signed)
Psychoeducational Group Note  Date:  04/08/2012 Time: 1100  Group Topic/Focus:  Self Care:   The focus of this group is to help patients understand the importance of self-care in order to improve or restore emotional, physical, spiritual, interpersonal, and financial health.  Participation Level: Did Not Attend  Participation Quality:  Not Applicable  Affect:  Not Applicable  Cognitive:  Not Applicable  Insight:  Not Applicable  Engagement in Group: Not Applicable  Additional Comments:  Patient did not attend group.  Karleen Hampshire Brittini 04/08/2012, 6:27 PM

## 2012-04-08 NOTE — [facility] Suicide Risk Assessment (Signed)
Suicide Risk Assessment  Discharge Assessment     Demographic Factors:  Male, Caucasian, Low socioeconomic status, Living alone and Unemployed  Mental Status Per Nursing Assessment::   On Admission:  Self-harm thoughts  Current Mental Status by Physician: Patient alert and oriented to 4. Denies AH/Vh/HI/SI.  Loss Factors: Financial problems/change in socioeconomic status  Historical Factors: Family history of mental illness or substance abuse and Impulsivity  Risk Reduction Factors:   Positive coping skills or problem solving skills  Continued Clinical Symptoms:  Depression:   Recent sense of peace/wellbeing Alcohol/Substance Abuse/Dependencies  Cognitive Features That Contribute To Risk:  Thought constriction (tunnel vision)    Suicide Risk:  Minimal: No identifiable suicidal ideation.  Patients presenting with no risk factors but with morbid ruminations; may be classified as minimal risk based on the severity of the depressive symptoms  Discharge Diagnoses:   AXIS I:  Depressive Disorder NOS AXIS II:  Deferred AXIS III:   Past Medical History  Diagnosis Date  . Thyroid disease   . Hepatitis   . Neuromuscular disorder     pinched nerve  . Hypothyroidism   . Mental disorder   . Depression    AXIS IV:  economic problems and housing problems AXIS V:  61-70 mild symptoms  Plan Of Care/Follow-up recommendations:  Activity:  normal Diet:  normal  Is patient on multiple antipsychotic therapies at discharge:  No   Has Patient had three or more failed trials of antipsychotic monotherapy by history:  No  Recommended Plan for Multiple Antipsychotic Therapies: NA  Edward [name] 04/08/2012, 3:00 PM

## 2012-04-09 MED ORDER — GABAPENTIN 600 MG PO TABS
600.0000 mg | ORAL_TABLET | Freq: Three times a day (TID) | ORAL | Status: DC
  Filled 2012-04-09: qty 42

## 2012-04-09 NOTE — Discharge Summary (Signed)
Physician Discharge Summary Note  Patient:  Edward [name] is an 51 y.o., male MRN:  [id number] DOB:  01/27/1961 Patient phone:  [phone] (home)  Patient address:   [address]   [address] [place] [address],   Date of Admission:  03/24/2012  Date of Discharge: 04/10/15  Reason for Admission:  Suicidal ideations.  Discharge Diagnoses: Active Problems:  * No active hospital problems. *   Review of Systems  Constitutional: Negative.   HENT: Negative.   Eyes: Negative.   Respiratory: Negative.   Cardiovascular: Negative.   Gastrointestinal: Negative.   Genitourinary: Negative.   Musculoskeletal: Positive for joint pain. Falls: Wears knne brace.  Skin: Negative.   Neurological: Negative.   Endo/Heme/Allergies: Negative.   Psychiatric/Behavioral: Positive for depression (Stabilized with medication upon discharge.) and substance abuse (Will continue substance abuse traetment upon discharge.). Negative for suicidal ideas and hallucinations. The patient is nervous/anxious (Stabilized with medication upon discharge.).    Axis Diagnosis:   AXIS I:  Substance Induced Mood Disorder and Alcohol dependence, Cocaine abuse AXIS II:  Deferred AXIS III:   Past Medical History  Diagnosis Date  . Thyroid disease   . Hepatitis   . Neuromuscular disorder     pinched nerve  . Hypothyroidism   . Mental disorder   . Depression    AXIS IV:  economic problems, housing problems, occupational problems and other psychosocial or environmental problems AXIS V:  62  Level of Care:  OP  Hospital Course:  Edward [name] is an 51 y.o. male. Pt suicidal with plan to hang self or walk into traffic. Pt has access to rope, trees, areas where he can hang self and can use his legs which makes his plan viable. Pt denies HI. Pt is irritable, he states answering the same questions multiple times. Pt reports hearing voices "sometimes" and no commands. Pt has hx of AH issues from other admissions (25-30 in his life  time). Pt attempted to commit suicide by overdose and was admitted in [place] to inptx years ago per pt.  Pt started using alcohol and THC at the age of 56. Pt started cocaine use at the age of 89. Pt was sober/clean from 1988 to 1998. Pt also reports being sober and clean from use a year or two here and there. Pt last relapse was 5 weeks ago. Pt identifies trigger as depression over girlfriend using and not wanting help. Pt moved out 3 mths ago. The isolation and built per pt and he is depressed and disappointed with self.   While a patient in this hospital, Edward [name] received medication management for his depression. He was prescribed and received Prozac 20 mg daily for depression and Abilify 2 mg Q bedtime for mood control, Hydroxyzine 50 mg for anxiety, Mirtazapine 15 mg Q bedtime for depression/sleep and Neurontin 300 mg for anxiety. He was also enrolled in group counseling sessions and activities to learn coping skills that should help him maintain stability after discharge. However, patient participated occasionally. Patient also received medication management and monitoring for his other medical issues and concerns. He tolerated his treatment regimen without any significant adverse effects and or reactions.  Patient did respond to his treatment regimen after rigorous trial of medications. This is evidenced by his daily reports of improved mood, reduction of symptoms and presentation of good affect/eye contact. However, he did on daily basis reports passive intrusive suicidal thoughts but denies any plans and or intent to harm self and or others while  in this hospital. He also added that he did not know what he will do if he is not in the hospital at that point. Edward [name] also stated he did promise his little sister that he will never do anything to hurt himself and he is bend on keeping that promise.  Patient attended treatment team meeting this am and met with the treatment team members. His reason for  admissions, present symptoms, treatment plans and response to treatment plans discussed. Patient endorsed that he is doing well and stable for discharge. It was agreed upon that he will continue psychiatric care at [facility] in [place], [place] on 04/09/12 @ 08:00 am for substance abuse treatment, and for medication management, he is scheduled to go to [facility]. Patient is instructed and informed that his appointment at [facility] is a walk-in between the hours of 08:00 am and 03:00 pm. The addresses, dates and times for these appointments provided for patient.  Upon discharge, patient adamantly denies suicidal, homicidal ideations, auditory, visual hallucinations and or delusional thinking. He received from [facility] a 2 weeks worth supply samples of his [facility] discharge medications. He left [facility] with all personal belongings via bus transport in no apparent in no apparent distress. Bus fare provided by [facility].  Consults:  None  Significant Diagnostic Studies:  labs: CbC with diff, CMP, UDS, toxicology tests.  Discharge Vitals:   Blood pressure 122/82, pulse 69, temperature 97.9 F (36.6 C), temperature source Oral, resp. rate 16, height 5' 7.5" (1.715 m), weight 78.926 kg (174 lb). Body mass index is 26.85 kg/(m^2). Lab Results:   No results found for this or any previous visit (from the past 72 hour(s)).  Physical Findings: AIMS: Facial and Oral Movements Muscles of Facial Expression: None, normal Lips and Perioral Area: None, normal Jaw: None, normal Tongue: None, normal,Extremity Movements Upper (arms, wrists, hands, fingers): None, normal Lower (legs, knees, ankles, toes): None, normal, Trunk Movements Neck, shoulders, hips: None, normal, Overall Severity Severity of abnormal movements (highest score from questions above): None, normal Incapacitation due to abnormal movements: None, normal Patient's awareness of abnormal movements (rate only patient's report): No Awareness, Dental  Status Current problems with teeth and/or dentures?: No Does patient usually wear dentures?: No  CIWA:  CIWA-Ar Total: 4  COWS:     Psychiatric Specialty Exam: See Psychiatric Specialty Exam and Suicide Risk Assessment completed by Attending Physician prior to discharge.  Discharge destination:  [facility]  Is patient on multiple antipsychotic therapies at discharge:  No   Has Patient had three or more failed trials of antipsychotic monotherapy by history:  Yes,   Antipsychotic medications that previously failed include:   1.  Risperdal. 2. Seroquel, Haldol  Recommended Plan for Multiple Antipsychotic Therapies: NA     Medication List     As of 04/09/2012  2:49 PM    STOP taking these medications         desvenlafaxine 50 MG 24 hr tablet   Commonly known as: PRISTIQ      traMADol 50 MG tablet   Commonly known as: ULTRAM      triamcinolone cream 0.1 %   Commonly known as: KENALOG      TAKE these medications      Indication    ARIPiprazole 5 MG tablet   Commonly known as: ABILIFY   Take 1 tablet (5 mg total) by mouth daily at 8 pm. For mood control       ciprofloxacin 250 MG tablet   Commonly known  as: CIPRO   Take 1 tablet (250 mg total) by mouth 2 (two) times daily. For infection       FLUoxetine 20 MG capsule   Commonly known as: PROZAC   Take 3 capsules (60 mg total) by mouth daily. For depression    Indication: Depression      gabapentin 300 MG capsule   Commonly known as: NEURONTIN   Take 2 capsules (600 mg total) by mouth 3 (three) times daily. For anxiety/pain       hydrOXYzine 50 MG tablet   Commonly known as: ATARAX/VISTARIL   Take 1 tablet (50 mg total) by mouth daily at 8 pm. For anxiety       levothyroxine 100 MCG tablet   Commonly known as: SYNTHROID, LEVOTHROID   Take 1 tablet (100 mcg total) by mouth daily. For Hypothyroidism.       mirtazapine 15 MG tablet   Commonly known as: REMERON   Take 1 tablet (15 mg total) by mouth at bedtime. For  depression/sleep       naproxen 500 MG tablet   Commonly known as: NAPROSYN   Take 1 tablet (500 mg total) by mouth 2 (two) times daily as needed. For pain       nicotine 21 mg/24hr patch   Commonly known as: NICODERM CQ - dosed in mg/24 hours   Place 28 patches onto the skin daily. For smoking cessation.         Follow-up Information    Follow up with [facility]. On 04/09/2012. (You are scheduled for an admission assessment on Tuesday, April 09, 2012 at 8 AM.  An admission date will be provided following the assessment.)    Contact information:   [address]  [place], [place]  [address]  [phone]      Follow up with [facility]. On 04/10/2012. (Please go to [facility] walk in clinic on Wednesday, April 10, 2012 or any weekday between 8AM-3PM for medication management)    Contact information:   [address] [place], [place]   [address]  [phone]         Follow-up recommendations:  Activity:  as tolerated Other:  Keep all scheduled follow-up appointments as recommended.    Comments:  Take all your medications as prescribed by your mental healthcare provider. Report any adverse effects and or reactions from your medicines to your outpatient provider promptly. Patient is instructed and cautioned to not engage in alcohol and or illegal drug use while on prescription medicines. In the event of worsening symptoms, patient is instructed to call the crisis hotline, 911 and or go to the nearest ED for appropriate evaluation and treatment of symptoms. Follow-up with your primary care provider for your other medical issues, concerns and or health care needs.     Total Discharge Time:  Greater than 30 minutes.  SignedArmandina Stammer I 04/09/2012, 2:49 PM

## 2012-04-09 NOTE — Progress Notes (Signed)
Pt D/Cd this AM. He was given a copy of DC instructions. Writer reviewed and pt verbalized understanding. He was also provided with Rx's. Writer reviewed the Rx's and pt verbalized understanding. He was also provided with bus passes and he understood where to find bus stop. He denies SI/HI/AVH. He was in no acute distress and he offered no questions or concerns.

## 2012-04-09 NOTE — Progress Notes (Signed)
Currently resting quietly in bed with eyes closed. Respirations even and unlabored. No acute distress noted. Safety has been maintained with Q15 minute observation. Will continue current POC 

## 2012-04-10 NOTE — Progress Notes (Signed)
[facility] Adult Case Management Discharge Plan : Late Entry for 04/09/12  Will you be returning to the same living situation after discharge: No.   At discharge, do you have transportation home?:Yes,  Patient assisted with bus pass Do you have the ability to pay for your medications:No.  Patient assisted with indigent meds  Release of information consent forms completed and in the chart;  Patient's signature needed at discharge.  Patient to Follow up at: Follow-up Information    Follow up with [facility]. On 04/09/2012. (You are scheduled for an admission assessment on Tuesday, April 09, 2012 at 8 AM.  An admission date will be provided following the assessment.)    Contact information:   [address]  [place], [place]  [address]  [phone]      Follow up with [facility]. On 04/10/2012. (Please go to [facility] walk in clinic on Wednesday, April 10, 2012 or any weekday between 8AM-3PM for medication management)    Contact information:   [address] [place], [place]   [address]  [phone]         Patient denies SI/HI:   No.  Patient endorsed chronic SI but no plan or intent to harm himself  Safety Planning and Suicide Prevention discussed:  Yes,  Reviewed during aftercare groups  Edward [name], Edward [name] July 04/10/2012, 11:05 AM

## 2012-04-11 NOTE — Progress Notes (Signed)
Patient Discharge Instructions:  After Visit Summary (AVS):   Faxed to:  04/11/12 Psychiatric Admission Assessment Note:   Faxed to:  04/11/12 Suicide Risk Assessment - Discharge Assessment:   Faxed to:  04/11/12 Faxed/Sent to the Next Level Care provider:  04/11/12 Faxed to [facility] @ [phone] Faxed to [facility] @ [phone] Jerelene Redden, 04/11/2012, 4:03 PM

## 2012-04-12 NOTE — Discharge Summary (Signed)
Reviewed

## 2012-04-29 ENCOUNTER — Encounter (HOSPITAL_COMMUNITY): Payer: Self-pay | Admitting: *Deleted

## 2012-04-29 ENCOUNTER — Emergency Department (HOSPITAL_COMMUNITY)
Admission: EM | Admit: 2012-04-29 | Discharge: 2012-04-29 | Disposition: A | Discharge status: Home / Self Care | Payer: Medicaid Other | Attending: Emergency Medicine | Admitting: Emergency Medicine

## 2012-04-29 ENCOUNTER — Emergency Department (HOSPITAL_COMMUNITY): Payer: Medicaid Other

## 2012-04-29 DIAGNOSIS — Y929 Unspecified place or not applicable: Secondary | ICD-10-CM | POA: Insufficient documentation

## 2012-04-29 DIAGNOSIS — M25561 Pain in right knee: Secondary | ICD-10-CM

## 2012-04-29 DIAGNOSIS — Z791 Long term (current) use of non-steroidal anti-inflammatories (NSAID): Secondary | ICD-10-CM | POA: Insufficient documentation

## 2012-04-29 DIAGNOSIS — S8990XA Unspecified injury of unspecified lower leg, initial encounter: Secondary | ICD-10-CM | POA: Insufficient documentation

## 2012-04-29 DIAGNOSIS — F17200 Nicotine dependence, unspecified, uncomplicated: Secondary | ICD-10-CM | POA: Insufficient documentation

## 2012-04-29 DIAGNOSIS — Y9301 Activity, walking, marching and hiking: Secondary | ICD-10-CM | POA: Insufficient documentation

## 2012-04-29 DIAGNOSIS — X500XXA Overexertion from strenuous movement or load, initial encounter: Secondary | ICD-10-CM | POA: Insufficient documentation

## 2012-04-29 DIAGNOSIS — Z8669 Personal history of other diseases of the nervous system and sense organs: Secondary | ICD-10-CM | POA: Insufficient documentation

## 2012-04-29 DIAGNOSIS — Z79899 Other long term (current) drug therapy: Secondary | ICD-10-CM | POA: Insufficient documentation

## 2012-04-29 DIAGNOSIS — E039 Hypothyroidism, unspecified: Secondary | ICD-10-CM | POA: Insufficient documentation

## 2012-04-29 DIAGNOSIS — Z8619 Personal history of other infectious and parasitic diseases: Secondary | ICD-10-CM | POA: Insufficient documentation

## 2012-04-29 DIAGNOSIS — F341 Dysthymic disorder: Secondary | ICD-10-CM | POA: Insufficient documentation

## 2012-04-29 DIAGNOSIS — Z8659 Personal history of other mental and behavioral disorders: Secondary | ICD-10-CM | POA: Insufficient documentation

## 2012-04-29 HISTORY — DX: Depression, unspecified: F32.A

## 2012-04-29 HISTORY — DX: Anxiety disorder, unspecified: F41.9

## 2012-04-29 HISTORY — DX: Major depressive disorder, single episode, unspecified: F32.9

## 2012-04-29 MED ORDER — OXYCODONE-ACETAMINOPHEN 5-325 MG PO TABS
1.0000 | ORAL_TABLET | Freq: Once | ORAL | Status: AC
  Administered 2012-04-29: 1 via ORAL
  Filled 2012-04-29: qty 1

## 2012-04-29 MED ORDER — IBUPROFEN 600 MG PO TABS: 600.0000 mg | ORAL_TABLET | Freq: Three times a day (TID) | ORAL | Status: DC | PRN

## 2012-04-29 MED ORDER — HYDROCODONE-ACETAMINOPHEN 5-325 MG PO TABS: 1.0000 | ORAL_TABLET | ORAL | Status: DC | PRN

## 2012-04-29 MED ORDER — ONDANSETRON 8 MG PO TBDP
8.0000 mg | ORAL_TABLET | Freq: Once | ORAL | Status: AC
  Administered 2012-04-29: 8 mg via ORAL
  Filled 2012-04-29: qty 1

## 2012-04-29 NOTE — ED Notes (Signed)
[id number]<AV> Expected date:<BR> Expected time:<BR> Means of arrival:<BR> Comments:<BR> Knee Pain

## 2012-04-29 NOTE — ED Notes (Signed)
Pt to ED via EMS; pt was walking around [place] this evening after using cocaine; pt reports that his Rt knee "popped" while walking down some stairs; pt reports recent Rt knee injury 6 weeks ago

## 2012-04-29 NOTE — ED Provider Notes (Signed)
History     CSN: [id number]  Arrival date & time 04/29/12  1191   First MD Initiated Contact with Patient 04/29/12 0532      Chief Complaint  Patient presents with  . Knee Pain     The history is provided by the patient and the EMS personnel.   patient reports that he was walking down some stairs this evening when he reinjured his right knee.  He reports he was recently diagnosed with a right knee meniscal tear on MRI while in [facility].  He never followed up with an orthopedic surgeon.  He states he heard a pop.  No weakness or numbness.  His pain is mild to moderate at this time  Past Medical History  Diagnosis Date  . Thyroid disease   . Hepatitis   . Neuromuscular disorder     pinched nerve  . Hypothyroidism   . Mental disorder   . Depression   . Anxiety and depression     History reviewed. No pertinent past surgical history.  No family history on file.  History  Substance Use Topics  . Smoking status: Current Every Day Smoker -- 0.2 packs/day for 7 years    Types: Cigarettes  . Smokeless tobacco: Not on file  . Alcohol Use: 3.0 oz/week    5 Cans of beer per week     Comment: 5      Review of Systems  All other systems reviewed and are negative.    Allergies  Review of patient's allergies indicates no known allergies.  Home Medications   Current Outpatient Rx  Name  Route  Sig  Dispense  Refill  . ARIPIPRAZOLE 5 MG PO TABS   Oral   Take 1 tablet (5 mg total) by mouth daily at 8 pm. For mood control   30 tablet   0   . CIPROFLOXACIN HCL 250 MG PO TABS   Oral   Take 1 tablet (250 mg total) by mouth 2 (two) times daily. For infection         . FLUOXETINE HCL 20 MG PO CAPS   Oral   Take 3 capsules (60 mg total) by mouth daily. For depression   90 capsule   0   . GABAPENTIN 300 MG PO CAPS   Oral   Take 2 capsules (600 mg total) by mouth 3 (three) times daily. For anxiety/pain   90 capsule   0   . HYDROXYZINE HCL 50 MG PO  TABS   Oral   Take 1 tablet (50 mg total) by mouth daily at 8 pm. For anxiety   30 tablet   0   . LEVOTHYROXINE SODIUM 100 MCG PO TABS   Oral   Take 1 tablet (100 mcg total) by mouth daily. For Hypothyroidism.   30 tablet   0   . MIRTAZAPINE 15 MG PO TABS   Oral   Take 1 tablet (15 mg total) by mouth at bedtime. For depression/sleep   30 tablet   0   . NAPROXEN 500 MG PO TABS   Oral   Take 1 tablet (500 mg total) by mouth 2 (two) times daily as needed. For pain         . NICOTINE 21 MG/24HR TD PT24   Transdermal   Place 28 patches onto the skin daily. For smoking cessation.   28 patch   0     BP 138/97  Pulse 108  Temp 98.9 F (37.2 C) (  Oral)  Resp 18  SpO2 95%  Physical Exam  Constitutional: He is oriented to person, place, and time. He appears well-developed and well-nourished.  HENT:  Head: Normocephalic.  Eyes: EOM are normal.  Neck: Normal range of motion.  Pulmonary/Chest: Effort normal.  Abdominal: He exhibits no distension.  Musculoskeletal: Normal range of motion.  Neurological: He is alert and oriented to person, place, and time.  Psychiatric: He has a normal mood and affect.    ED Course  Procedures (including critical care time)  Labs Reviewed - No data to display Dg Knee Complete 4 Views Right  04/29/2012  *RADIOLOGY REPORT*  Clinical Data: Knee popped.  Pain.  RIGHT KNEE - COMPLETE 4+ VIEW  Comparison: None.  Findings: No fracture, dislocation or joint effusion is identified. There is sclerosis and subchondral lucency in the medial femoral condyle which could be due to osteochondral defect.  Joint spaces are preserved.  Small medial compartment osteophytes are noted.  IMPRESSION:  1.  No acute finding. 2.  Findings suggestive of an osteochondral defect of the medial femoral condyle.   Original Report Authenticated By: Holley Dexter, M.D.    I personally reviewed the imaging tests through PACS system I reviewed available ER/hospitalization  records through the EMR    1. Right knee pain       MDM  No evidence of fracture on his right knee.  Patient be discharged home with orthopedic followup.  He may require an MRI of his right knee.  Crutches given.  Home with hydrocodone.  The patient became nauseated after he took the Percocet.  Zofran in the emergency department.  Discharge home in good condition.        Lyanne Co, MD 04/29/12 [phone]

## 2012-06-09 ENCOUNTER — Emergency Department (HOSPITAL_COMMUNITY)
Admission: EM | Admit: 2012-06-09 | Discharge: 2012-06-09 | Disposition: A | Discharge status: Home / Self Care | Payer: Medicaid Other | Attending: Emergency Medicine | Admitting: Emergency Medicine

## 2012-06-09 ENCOUNTER — Encounter (HOSPITAL_COMMUNITY): Payer: Self-pay | Admitting: *Deleted

## 2012-06-09 DIAGNOSIS — E039 Hypothyroidism, unspecified: Secondary | ICD-10-CM | POA: Insufficient documentation

## 2012-06-09 DIAGNOSIS — E079 Disorder of thyroid, unspecified: Secondary | ICD-10-CM | POA: Insufficient documentation

## 2012-06-09 DIAGNOSIS — G8929 Other chronic pain: Secondary | ICD-10-CM | POA: Insufficient documentation

## 2012-06-09 DIAGNOSIS — G709 Myoneural disorder, unspecified: Secondary | ICD-10-CM | POA: Insufficient documentation

## 2012-06-09 DIAGNOSIS — R45851 Suicidal ideations: Secondary | ICD-10-CM | POA: Insufficient documentation

## 2012-06-09 DIAGNOSIS — F341 Dysthymic disorder: Secondary | ICD-10-CM | POA: Insufficient documentation

## 2012-06-09 DIAGNOSIS — F1910 Other psychoactive substance abuse, uncomplicated: Secondary | ICD-10-CM | POA: Insufficient documentation

## 2012-06-09 DIAGNOSIS — M25569 Pain in unspecified knee: Secondary | ICD-10-CM | POA: Insufficient documentation

## 2012-06-09 DIAGNOSIS — Z87828 Personal history of other (healed) physical injury and trauma: Secondary | ICD-10-CM | POA: Insufficient documentation

## 2012-06-09 DIAGNOSIS — F489 Nonpsychotic mental disorder, unspecified: Secondary | ICD-10-CM | POA: Insufficient documentation

## 2012-06-09 DIAGNOSIS — F1010 Alcohol abuse, uncomplicated: Secondary | ICD-10-CM | POA: Insufficient documentation

## 2012-06-09 DIAGNOSIS — Z79899 Other long term (current) drug therapy: Secondary | ICD-10-CM | POA: Insufficient documentation

## 2012-06-09 DIAGNOSIS — Z8719 Personal history of other diseases of the digestive system: Secondary | ICD-10-CM | POA: Insufficient documentation

## 2012-06-09 DIAGNOSIS — Z791 Long term (current) use of non-steroidal anti-inflammatories (NSAID): Secondary | ICD-10-CM | POA: Insufficient documentation

## 2012-06-09 DIAGNOSIS — F17200 Nicotine dependence, unspecified, uncomplicated: Secondary | ICD-10-CM | POA: Insufficient documentation

## 2012-06-09 DIAGNOSIS — F329 Major depressive disorder, single episode, unspecified: Secondary | ICD-10-CM | POA: Insufficient documentation

## 2012-06-09 LAB — RAPID URINE DRUG SCREEN, HOSP PERFORMED
Barbiturates: NOT DETECTED
Benzodiazepines: NOT DETECTED
Cocaine: NOT DETECTED
Opiates: NOT DETECTED
Tetrahydrocannabinol: POSITIVE — AB

## 2012-06-09 LAB — COMPREHENSIVE METABOLIC PANEL
ALT: 368 U/L — ABNORMAL HIGH (ref 0–53)
AST: 390 U/L — ABNORMAL HIGH (ref 0–37)
Albumin: 4.1 g/dL (ref 3.5–5.2)
Calcium: 8.7 mg/dL (ref 8.4–10.5)
Creatinine, Ser: 0.68 mg/dL (ref 0.50–1.35)
Total Protein: 8 g/dL (ref 6.0–8.3)

## 2012-06-09 LAB — CBC
HCT: 39.8 % (ref 39.0–52.0)
Hemoglobin: 13.6 g/dL (ref 13.0–17.0)
WBC: 5.1 10*3/uL (ref 4.0–10.5)

## 2012-06-09 MED ORDER — ZOLPIDEM TARTRATE 5 MG PO TABS: 5.0000 mg | ORAL_TABLET | Freq: Every evening | ORAL | Status: DC | PRN

## 2012-06-09 MED ORDER — ONDANSETRON HCL 4 MG PO TABS: 4.0000 mg | ORAL_TABLET | Freq: Three times a day (TID) | ORAL | Status: DC | PRN

## 2012-06-09 MED ORDER — ALUM & MAG HYDROXIDE-SIMETH 200-200-20 MG/5ML PO SUSP: 30.0000 mL | ORAL | Status: DC | PRN

## 2012-06-09 MED ORDER — LORAZEPAM 1 MG PO TABS
0.0000 mg | ORAL_TABLET | Freq: Four times a day (QID) | ORAL | Status: DC
  Administered 2012-06-09: 1 mg via ORAL
  Filled 2012-06-09: qty 1

## 2012-06-09 MED ORDER — FOLIC ACID 1 MG PO TABS
1.0000 mg | ORAL_TABLET | Freq: Every day | ORAL | Status: DC
  Administered 2012-06-09: 1 mg via ORAL
  Filled 2012-06-09: qty 1

## 2012-06-09 MED ORDER — THIAMINE HCL 100 MG/ML IJ SOLN: 100.0000 mg | Freq: Every day | INTRAMUSCULAR | Status: DC

## 2012-06-09 MED ORDER — NICOTINE 21 MG/24HR TD PT24
21.0000 mg | MEDICATED_PATCH | Freq: Every day | TRANSDERMAL | Status: DC
  Administered 2012-06-09: 21 mg via TRANSDERMAL
  Filled 2012-06-09: qty 1

## 2012-06-09 MED ORDER — ADULT MULTIVITAMIN W/MINERALS CH
1.0000 | ORAL_TABLET | Freq: Every day | ORAL | Status: DC
  Administered 2012-06-09: 1 via ORAL
  Filled 2012-06-09: qty 1

## 2012-06-09 MED ORDER — IBUPROFEN 600 MG PO TABS: 600.0000 mg | ORAL_TABLET | Freq: Three times a day (TID) | ORAL | Status: DC | PRN

## 2012-06-09 MED ORDER — LORAZEPAM 1 MG PO TABS: 0.0000 mg | ORAL_TABLET | Freq: Two times a day (BID) | ORAL | Status: DC

## 2012-06-09 MED ORDER — VITAMIN B-1 100 MG PO TABS
100.0000 mg | ORAL_TABLET | Freq: Every day | ORAL | Status: DC
  Administered 2012-06-09: 100 mg via ORAL
  Filled 2012-06-09: qty 1

## 2012-06-09 NOTE — ED Provider Notes (Signed)
Psychiatric consultation was reviewed. Psychiatry feels that patient is stable for discharge at this time. Recommending outpatient therapy and alcohol detox/rehabilitation.  Raeford Razor, MD 06/09/12 2241

## 2012-06-09 NOTE — ED Notes (Signed)
Pt has 2 belongings bags at triage nurses station. Pt wanded and belongings searched.

## 2012-06-09 NOTE — ED Notes (Signed)
Pt transferred from triage, presents with c/o SI ideations, plan  to cut himself & bleed to death. Denies HI.  Feeling hopeless.  Pt is homeless. Admits to alc & marijuana use.  Pt reports diag, with PTSD & depression.  Pt calm & cooperative at present.

## 2012-06-09 NOTE — ED Notes (Signed)
Pt told EMS "I'm not right from here up" ( hand placed at neck) as pt made statement. Denies S/I  H/I

## 2012-06-09 NOTE — ED Notes (Signed)
Pt c/o SI and depression x 3 days. Pt denies specific plan. Pt admits to ETOH and marijuana use. Last used marijuana yesterday and ETOH a few hours ago. Pt c/o chronic R knee pain w/ reinjury. Pt states is homeless.

## 2012-06-10 ENCOUNTER — Emergency Department (HOSPITAL_COMMUNITY)
Admission: EM | Admit: 2012-06-10 | Discharge: 2012-06-10 | Disposition: A | Discharge status: Home / Self Care | Payer: Medicaid Other | Attending: Emergency Medicine | Admitting: Emergency Medicine

## 2012-06-10 ENCOUNTER — Encounter (HOSPITAL_COMMUNITY): Payer: Self-pay | Admitting: Emergency Medicine

## 2012-06-10 DIAGNOSIS — Z79899 Other long term (current) drug therapy: Secondary | ICD-10-CM | POA: Insufficient documentation

## 2012-06-10 DIAGNOSIS — Z862 Personal history of diseases of the blood and blood-forming organs and certain disorders involving the immune mechanism: Secondary | ICD-10-CM | POA: Insufficient documentation

## 2012-06-10 DIAGNOSIS — F32A Depression, unspecified: Secondary | ICD-10-CM

## 2012-06-10 DIAGNOSIS — Z8719 Personal history of other diseases of the digestive system: Secondary | ICD-10-CM | POA: Insufficient documentation

## 2012-06-10 DIAGNOSIS — F3289 Other specified depressive episodes: Secondary | ICD-10-CM | POA: Insufficient documentation

## 2012-06-10 DIAGNOSIS — Z8669 Personal history of other diseases of the nervous system and sense organs: Secondary | ICD-10-CM | POA: Insufficient documentation

## 2012-06-10 DIAGNOSIS — F341 Dysthymic disorder: Secondary | ICD-10-CM | POA: Insufficient documentation

## 2012-06-10 DIAGNOSIS — Z8639 Personal history of other endocrine, nutritional and metabolic disease: Secondary | ICD-10-CM | POA: Insufficient documentation

## 2012-06-10 DIAGNOSIS — F329 Major depressive disorder, single episode, unspecified: Secondary | ICD-10-CM

## 2012-06-10 DIAGNOSIS — F489 Nonpsychotic mental disorder, unspecified: Secondary | ICD-10-CM | POA: Insufficient documentation

## 2012-06-10 DIAGNOSIS — F17200 Nicotine dependence, unspecified, uncomplicated: Secondary | ICD-10-CM | POA: Insufficient documentation

## 2012-06-10 LAB — CBC
Hemoglobin: 13.5 g/dL (ref 13.0–17.0)
MCH: 29.8 pg (ref 26.0–34.0)
MCV: 85.4 fL (ref 78.0–100.0)
Platelets: 169 10*3/uL (ref 150–400)
RBC: 4.53 MIL/uL (ref 4.22–5.81)

## 2012-06-10 LAB — COMPREHENSIVE METABOLIC PANEL
BUN: 14 mg/dL (ref 6–23)
CO2: 26 mEq/L (ref 19–32)
Calcium: 9.1 mg/dL (ref 8.4–10.5)
Creatinine, Ser: 0.65 mg/dL (ref 0.50–1.35)
GFR calc Af Amer: >90 mL/min (ref >90)
GFR calc non Af Amer: >90 mL/min (ref >90)
Glucose, Bld: 93 mg/dL (ref 70–99)

## 2012-06-10 LAB — ETHANOL: Alcohol, Ethyl (B): <11 mg/dL (ref 0–11)

## 2012-06-10 LAB — RAPID URINE DRUG SCREEN, HOSP PERFORMED
Cocaine: NOT DETECTED
Opiates: NOT DETECTED

## 2012-06-10 NOTE — ED Provider Notes (Signed)
Medical screening examination/treatment/procedure(s) were performed by non-physician practitioner and as supervising physician I was immediately available for consultation/collaboration.   Annina Piotrowski B. Bernette Mayers, MD 06/10/12 [phone]

## 2012-06-10 NOTE — ED Provider Notes (Signed)
This chart was scribed for non-physician practitioner working with Edward Most B. Bernette Mayers, MD by Donne Anon, ED Scribe. This patient was seen in room [facility] and the patient's care was started at 1929.   CSN: [id number]    Arrival date & time 06/09/12 1721   First MD Initiated Contact with Patient 06/09/12 1929   Chief Complaint   Patient presents with   .  Medical Clearance    The history is provided by the patient. No language interpreter was used.   Edward [name] is a 52 y.o. male who presents to the Emergency Department complaining of gradual onset, moderate, non radiating chronic right knee pain. He reports that he fractured his patella and tore his meniscus in October and he keeps re-injuring it. He is homeless and stays at the [facility] typically. He also reports that he has substance abuse issues. He reports he drinks alcohol (2-3 quarts beer/day) and uses marijuana (nickle bag/day) Just started Vibrate for depression and PTSD.    He states he is currently on the waiting list at [facility] for ECT for his depression and PTSD and he wants to get well. He states he got ECT 13 years ago and it "started to work." He feels suicidal today. He has a plan (cut his forearm and bleed out). He has overdosed 2x previously (13 years apart, last one in 2012) in suicide attempts. He denies homicidal ideation. He reports auditory hallucination (I'm not good, God hates me, better off dead-always a women's voice) but denies they ever give him commands. He reports his knee gave out this morning, but he did not fall or hit his head. He is worried about using his social security money on drugs. He has a H/o IV drug abuse in 1982 but denies any recently. He has a h/o hypothyroidism and hepatitis C.    Past Medical History   Diagnosis  Date   .  Thyroid disease    .  Hepatitis    .  Neuromuscular disorder      pinched nerve   .  Hypothyroidism    .  Mental disorder    .  Depression    .  Anxiety and  depression     History reviewed. No pertinent past surgical history.  History reviewed. No pertinent family history.  History   Substance Use Topics   .  Smoking status:  Current Every Day Smoker -- 0.25 packs/day for 7 years     Types:  Cigarettes   .  Smokeless tobacco:  Not on file   .  Alcohol Use:  3.0 oz/week     5 Cans of beer per week      Comment: 5     Review of Systems  Constitutional: Negative for fever, diaphoresis, appetite change, fatigue and unexpected weight change.  HENT: Negative for mouth sores and neck stiffness.  Eyes: Negative for visual disturbance.  Respiratory: Negative for cough, chest tightness, shortness of breath and wheezing.  Cardiovascular: Negative for chest pain.  Gastrointestinal: Negative for nausea, vomiting, abdominal pain, diarrhea and constipation.  Endocrine: Negative for polydipsia, polyphagia and polyuria.  Genitourinary: Negative for dysuria, urgency, frequency and hematuria.  Musculoskeletal: Positive for arthralgias. Negative for back pain.  Skin: Negative for rash.  Allergic/Immunologic: Negative for immunocompromised state.  Neurological: Negative for syncope, light-headedness and headaches.  Hematological: Does not bruise/bleed easily.  Psychiatric/Behavioral: Positive for suicidal ideas and self-injury. Negative for sleep disturbance. The patient is not nervous/anxious.  All other systems reviewed  and are negative.   Allergies   Review of patient's allergies indicates no known allergies.  Home Medications    Current Outpatient Rx   Name   Route   Sig   Dispense   Refill   .  ARIPiprazole (ABILIFY) 5 MG tablet   Oral   Take 5 mg by mouth daily. mood       .  FLUoxetine (PROZAC) 20 MG capsule   Oral   Take 3 capsules (60 mg total) by mouth daily. For depression   90 capsule   0   .  gabapentin (NEURONTIN) 300 MG capsule   Oral   Take 2 capsules (600 mg total) by mouth 3 (three) times daily. For anxiety/pain   90 capsule   0   .   HYDROcodone-acetaminophen (NORCO/VICODIN) 5-325 MG per tablet   Oral   Take 1 tablet by mouth every 4 (four) hours as needed for pain.   15 tablet   0   .  hydrOXYzine (ATARAX/VISTARIL) 50 MG tablet   Oral   Take 1 tablet (50 mg total) by mouth daily at 8 pm. For anxiety   30 tablet   0   .  ibuprofen (ADVIL,MOTRIN) 600 MG tablet   Oral   Take 1 tablet (600 mg total) by mouth every 8 (eight) hours as needed for pain.   15 tablet   0   .  ibuprofen (ADVIL,MOTRIN) 800 MG tablet   Oral   Take 800 mg by mouth every 8 (eight) hours as needed.       Marland Kitchen  levothyroxine (SYNTHROID, LEVOTHROID) 88 MCG tablet   Oral   Take 88 mcg by mouth daily.       .  mirtazapine (REMERON) 15 MG tablet   Oral   Take 1 tablet (15 mg total) by mouth at bedtime. For depression/sleep   30 tablet   0   .  naproxen (NAPROSYN) 500 MG tablet   Oral   Take 1 tablet (500 mg total) by mouth 2 (two) times daily as needed. For pain       .  nicotine (NICODERM CQ - DOSED IN MG/24 HOURS) 21 mg/24hr patch   Transdermal   Place 28 patches onto the skin daily. For smoking cessation.   28 patch   0    Triage Vitals; BP 119/80  Pulse 69  Temp(Src) 98.1 F (36.7 C) (Oral)  Resp 20  SpO2 96%  Physical Exam  Nursing note and vitals reviewed.  Constitutional: He appears well-developed and well-nourished. No distress.  HENT:  Head: Normocephalic and atraumatic.  Mouth/Throat: Oropharynx is clear and moist. No oropharyngeal exudate.  Eyes: Conjunctivae are normal. No scleral icterus.  Neck: Normal range of motion. Neck supple.  Cardiovascular: Normal rate, regular rhythm and intact distal pulses.  Pulmonary/Chest: Effort normal and breath sounds normal. No respiratory distress. He has no wheezes.  Abdominal: Soft. Bowel sounds are normal. He exhibits no mass. There is no tenderness. There is no rebound and no guarding.  Musculoskeletal: Normal range of motion. He exhibits no edema.  Intact distal pulses. Swelling and tenderness to palpation  to medial compartment of right knee; tender to palpation. Full ROM.  Neurological: He is alert.  Speech is clear and goal oriented Moves extremities without ataxia  Skin: Skin is warm and dry. He is not diaphoretic.  Psychiatric: He exhibits depressed mood; suicidal ideation and suicidal plan; There is no homicidal ideation, auditory or visual hallucinations.  ED Course  Procedures (including critical care time)  DIAGNOSTIC STUDIES:  Oxygen Saturation is 96% on room air, adequate by my interpretation.  COORDINATION OF CARE:  7:57 PM Discussed treatment plan which includes medical clearance and ACT team with pt at bedside and pt agreed to plan.   Labs Reviewed   URINE RAPID DRUG SCREEN (HOSP PERFORMED) - Abnormal; Notable for the following:    Tetrahydrocannabinol  POSITIVE (*)     All other components within normal limits   CBC   COMPREHENSIVE METABOLIC PANEL   ETHANOL     1. Polysubstabce abuse 2. EtOH abuse  MDM   Edward [name] presents with suicidal ideation and polysubstance abuse.  Telepsyc pending.  Labs with elevated transaminases to baseline, elevated EtOH and UDS positive for THC.  Pt is medically cleared for behavioral health.    With telepsyc pt expressed vague thoughts of self harm but answered appropriately, maintained good eye contact and denied any plan or intent to harm himself or others. He admitted to feeling depressed about being homeless. Telepsych recommendation for outpatient therapy and alcohol detox or rehabilitation and he is cleared for discharge by telepsyc at this time.  Pt evaluated and discharged by Dr Juleen China.       Dahlia Client Suetta Hoffmeister, PA-C 06/10/12 0109

## 2012-06-10 NOTE — ED Notes (Signed)
Pt presenting to ed sent from [facility] pt with officer at bedside. Pt state he was seen here last night for the same. Pt states he is suicidal at this time with a plan to cut himself until he bleeds to death. Pt states worsening depression at this time. Pt sent from [facility] for clearance and then he is suppose to go back to [facility] per paperwork.

## 2012-06-10 NOTE — ED Provider Notes (Signed)
History     CSN: [id number]  Arrival date & time 06/10/12  1046   First MD Initiated Contact with Patient 06/10/12 1052      Chief complaint: depressed.   (Consider location/radiation/quality/duration/timing/severity/associated sxs/prior treatment) The history is provided by the patient.  pt with hx depression states has been feeling more depressed in the past couple weeks. Intermittent suicidal thoughts, no plan or attempt. Went to [facility], sent to ED for labs.  Pt states reasons for depression are due to many factors, no single inciting event. States recent health at baseline.  States compliant w his normal meds, no recent change in meds. States poor appetite, not sleeping well at night. No abd pain. No nvd.  States has been drinking more than normal due to depression, drinks 'a couple drinks' per day. Denies hx etoh withdrawal, seizures or dts.       Past Medical History  Diagnosis Date  . Thyroid disease   . Hepatitis   . Neuromuscular disorder     pinched nerve  . Hypothyroidism   . Mental disorder   . Depression   . Anxiety and depression     No past surgical history on file.  No family history on file.  History  Substance Use Topics  . Smoking status: Current Every Day Smoker -- 0.25 packs/day for 7 years    Types: Cigarettes  . Smokeless tobacco: Not on file  . Alcohol Use: 3.0 oz/week    5 Cans of beer per week     Comment: 5      Review of Systems  Constitutional: Negative for fever.  HENT: Negative for neck pain.   Eyes: Negative for pain.  Respiratory: Negative for shortness of breath.   Cardiovascular: Negative for chest pain.  Gastrointestinal: Negative for vomiting, abdominal pain and diarrhea.  Genitourinary: Negative for flank pain.  Musculoskeletal: Negative for back pain.  Skin: Negative for rash.  Neurological: Negative for headaches.  Hematological: Does not bruise/bleed easily.  Psychiatric/Behavioral: Positive for dysphoric mood.     Allergies  Review of patient's allergies indicates no known allergies.  Home Medications   Current Outpatient Rx  Name  Route  Sig  Dispense  Refill  . ARIPiprazole (ABILIFY) 5 MG tablet   Oral   Take 5 mg by mouth daily. mood         . gabapentin (NEURONTIN) 300 MG capsule   Oral   Take 2 capsules (600 mg total) by mouth 3 (three) times daily. For anxiety/pain   90 capsule   0   . hydrOXYzine (ATARAX/VISTARIL) 50 MG tablet   Oral   Take 1 tablet (50 mg total) by mouth daily at 8 pm. For anxiety   30 tablet   0   . ibuprofen (ADVIL,MOTRIN) 600 MG tablet   Oral   Take 1 tablet (600 mg total) by mouth every 8 (eight) hours as needed for pain.   15 tablet   0   . ibuprofen (ADVIL,MOTRIN) 800 MG tablet   Oral   Take 800 mg by mouth every 8 (eight) hours as needed.         Marland Kitchen levothyroxine (SYNTHROID, LEVOTHROID) 88 MCG tablet   Oral   Take 88 mcg by mouth daily.         . mirtazapine (REMERON) 15 MG tablet   Oral   Take 1 tablet (15 mg total) by mouth at bedtime. For depression/sleep   30 tablet   0   . naproxen (NAPROSYN)  500 MG tablet   Oral   Take 1 tablet (500 mg total) by mouth 2 (two) times daily as needed. For pain         . nicotine (NICODERM CQ - DOSED IN MG/24 HOURS) 21 mg/24hr patch   Transdermal   Place 28 patches onto the skin daily. For smoking cessation.   28 patch   0     There were no vitals taken for this visit.  Physical Exam  Nursing note and vitals reviewed. Constitutional: He is oriented to person, place, and time. He appears well-developed and well-nourished. No distress.  HENT:  Head: Atraumatic.  Mouth/Throat: Oropharynx is clear and moist.  Eyes: Conjunctivae are normal. Pupils are equal, round, and reactive to light. No scleral icterus.  Neck: Neck supple. No tracheal deviation present.  Cardiovascular: Normal rate, regular rhythm, normal heart sounds and intact distal pulses.   Pulmonary/Chest: Effort normal and  breath sounds normal. No accessory muscle usage. No respiratory distress.  Abdominal: Soft. He exhibits no distension. There is no tenderness.  Musculoskeletal: Normal range of motion.  Neurological: He is alert and oriented to person, place, and time.  Steady gait.   Skin: Skin is warm and dry.  Psychiatric:  Depressed mood/flat affect.     ED Course  Procedures (including critical care time)  Results for orders placed during the hospital encounter of 06/10/12  CBC      Result Value Range   WBC 3.0 (*) 4.0 - 10.5 K/uL   RBC 4.53  4.22 - 5.81 MIL/uL   Hemoglobin 13.5  13.0 - 17.0 g/dL   HCT 16.1 (*) 09.6 - 04.5 %   MCV 85.4  78.0 - 100.0 fL   MCH 29.8  26.0 - 34.0 pg   MCHC 34.9  30.0 - 36.0 g/dL   RDW 40.9  81.1 - 91.4 %   Platelets 169  150 - 400 K/uL  COMPREHENSIVE METABOLIC PANEL      Result Value Range   Sodium 137  135 - 145 mEq/L   Potassium 4.3  3.5 - 5.1 mEq/L   Chloride 104  96 - 112 mEq/L   CO2 26  19 - 32 mEq/L   Glucose, Bld 93  70 - 99 mg/dL   BUN 14  6 - 23 mg/dL   Creatinine, Ser 7.82  0.50 - 1.35 mg/dL   Calcium 9.1  8.4 - 95.6 mg/dL   Total Protein 7.2  6.0 - 8.3 g/dL   Albumin 3.8  3.5 - 5.2 g/dL   AST 213 (*) 0 - 37 U/L   ALT 352 (*) 0 - 53 U/L   Alkaline Phosphatase 93  39 - 117 U/L   Total Bilirubin 0.6  0.3 - 1.2 mg/dL   GFR calc non Af Amer >90  >90 mL/min   GFR calc Af Amer >90  >90 mL/min  ETHANOL      Result Value Range   Alcohol, Ethyl (B) <11  0 - 11 mg/dL  URINE RAPID DRUG SCREEN (HOSP PERFORMED)      Result Value Range   Opiates NONE DETECTED  NONE DETECTED   Cocaine NONE DETECTED  NONE DETECTED   Benzodiazepines NONE DETECTED  NONE DETECTED   Amphetamines NONE DETECTED  NONE DETECTED   Tetrahydrocannabinol POSITIVE (*) NONE DETECTED   Barbiturates NONE DETECTED  NONE DETECTED        MDM  Labs.  Reviewed nursing notes and prior charts for additional history.   Labs c/w  pts baseline/previous.  Pt stable for d/c back to  [facility] via security/law enforcement.           Suzi Roots, MD 06/10/12 1316

## 2012-06-10 NOTE — ED Notes (Signed)
Fax x 2. Attempted to call report per Orlene Erm she is in the middle of doing an assessment.

## 2012-06-24 ENCOUNTER — Emergency Department (HOSPITAL_COMMUNITY)
Admission: EM | Admit: 2012-06-24 | Discharge: 2012-06-25 | Disposition: A | Discharge status: Other Acute Inpatient Hospital | Payer: Medicaid Other | Attending: Emergency Medicine | Admitting: Emergency Medicine

## 2012-06-24 ENCOUNTER — Encounter (HOSPITAL_COMMUNITY): Payer: Self-pay | Admitting: Emergency Medicine

## 2012-06-24 DIAGNOSIS — Z8619 Personal history of other infectious and parasitic diseases: Secondary | ICD-10-CM | POA: Insufficient documentation

## 2012-06-24 DIAGNOSIS — F329 Major depressive disorder, single episode, unspecified: Secondary | ICD-10-CM | POA: Insufficient documentation

## 2012-06-24 DIAGNOSIS — Z8669 Personal history of other diseases of the nervous system and sense organs: Secondary | ICD-10-CM | POA: Insufficient documentation

## 2012-06-24 DIAGNOSIS — R45851 Suicidal ideations: Secondary | ICD-10-CM | POA: Insufficient documentation

## 2012-06-24 DIAGNOSIS — F411 Generalized anxiety disorder: Secondary | ICD-10-CM | POA: Insufficient documentation

## 2012-06-24 DIAGNOSIS — E038 Other specified hypothyroidism: Secondary | ICD-10-CM | POA: Insufficient documentation

## 2012-06-24 DIAGNOSIS — F489 Nonpsychotic mental disorder, unspecified: Secondary | ICD-10-CM | POA: Insufficient documentation

## 2012-06-24 DIAGNOSIS — F1020 Alcohol dependence, uncomplicated: Secondary | ICD-10-CM | POA: Insufficient documentation

## 2012-06-24 DIAGNOSIS — Z79899 Other long term (current) drug therapy: Secondary | ICD-10-CM | POA: Insufficient documentation

## 2012-06-24 DIAGNOSIS — F17200 Nicotine dependence, unspecified, uncomplicated: Secondary | ICD-10-CM | POA: Insufficient documentation

## 2012-06-24 DIAGNOSIS — F1410 Cocaine abuse, uncomplicated: Secondary | ICD-10-CM | POA: Insufficient documentation

## 2012-06-24 HISTORY — DX: Post-traumatic stress disorder, unspecified: F43.10

## 2012-06-24 HISTORY — DX: Major depressive disorder, single episode, unspecified: F32.9

## 2012-06-24 LAB — COMPREHENSIVE METABOLIC PANEL
ALT: 266 U/L — ABNORMAL HIGH (ref 0–53)
CO2: 28 mEq/L (ref 19–32)
Calcium: 9 mg/dL (ref 8.4–10.5)
Chloride: 102 mEq/L (ref 96–112)
Creatinine, Ser: 0.74 mg/dL (ref 0.50–1.35)
GFR calc Af Amer: >90 mL/min (ref >90)
GFR calc non Af Amer: >90 mL/min (ref >90)
Glucose, Bld: 83 mg/dL (ref 70–99)
Sodium: 139 mEq/L (ref 135–145)
Total Bilirubin: 0.6 mg/dL (ref 0.3–1.2)

## 2012-06-24 LAB — CBC
Hemoglobin: 13.9 g/dL (ref 13.0–17.0)
MCH: 30.3 pg (ref 26.0–34.0)
MCV: 86.3 fL (ref 78.0–100.0)
RBC: 4.59 MIL/uL (ref 4.22–5.81)

## 2012-06-24 LAB — RAPID URINE DRUG SCREEN, HOSP PERFORMED
Opiates: NOT DETECTED
Tetrahydrocannabinol: POSITIVE — AB

## 2012-06-24 MED ORDER — LORAZEPAM 1 MG PO TABS
0.0000 mg | ORAL_TABLET | Freq: Four times a day (QID) | ORAL | Status: DC
  Administered 2012-06-24: 2 mg via ORAL
  Filled 2012-06-24: qty 2

## 2012-06-24 MED ORDER — LORAZEPAM 1 MG PO TABS: 1.0000 mg | ORAL_TABLET | Freq: Four times a day (QID) | ORAL | Status: DC | PRN

## 2012-06-24 MED ORDER — THIAMINE HCL 100 MG/ML IJ SOLN: 100.0000 mg | Freq: Every day | INTRAMUSCULAR | Status: DC

## 2012-06-24 MED ORDER — ADULT MULTIVITAMIN W/MINERALS CH
1.0000 | ORAL_TABLET | Freq: Every day | ORAL | Status: DC
  Administered 2012-06-24: 1 via ORAL
  Filled 2012-06-24: qty 1

## 2012-06-24 MED ORDER — FOLIC ACID 1 MG PO TABS
1.0000 mg | ORAL_TABLET | Freq: Every day | ORAL | Status: DC
  Administered 2012-06-24: 1 mg via ORAL
  Filled 2012-06-24: qty 1

## 2012-06-24 MED ORDER — VITAMIN B-1 100 MG PO TABS
100.0000 mg | ORAL_TABLET | Freq: Every day | ORAL | Status: DC
  Administered 2012-06-24: 100 mg via ORAL
  Filled 2012-06-24 (×2): qty 1

## 2012-06-24 MED ORDER — LORAZEPAM 2 MG/ML IJ SOLN: 1.0000 mg | Freq: Four times a day (QID) | INTRAMUSCULAR | Status: DC | PRN

## 2012-06-24 MED ORDER — LORAZEPAM 1 MG PO TABS: 0.0000 mg | ORAL_TABLET | Freq: Two times a day (BID) | ORAL | Status: DC

## 2012-06-24 NOTE — ED Provider Notes (Signed)
History     CSN: [id number]  Arrival date & time 06/24/12  1323   First MD Initiated Contact with Patient 06/24/12 1351      Chief Complaint  Patient presents with  . Medical Clearance  . Suicidal  . Addiction Problem    (Consider location/radiation/quality/duration/timing/severity/associated sxs/prior treatment) HPI Comments: This is a 52 year old male, past medical history remarkable for major depression, anxiety, and hepatitis, who presents emergency department with chief complaint of medical clearance. Patient states that he wants to detox from cocaine and alcohol. States that he has suicidal ideation whenever he tries to detox. He denies having a plan at this time. He denies any HI. Patient states that he has chronic knee pain, but denies any other pain. Patient denies headache, blurred vision, new hearing loss, sore throat, chest pain, shortness of breath, nausea, vomiting, diarrhea, constipation, dysuria, peripheral edema, back pain, numbness or tingling of the extremities.   The history is provided by the patient. No language interpreter was used.    Past Medical History  Diagnosis Date  . Thyroid disease   . Hepatitis   . Neuromuscular disorder     pinched nerve  . Hypothyroidism   . Mental disorder   . Depression   . Anxiety and depression   . Major depression   . PTSD (post-traumatic stress disorder)     History reviewed. No pertinent past surgical history.  History reviewed. No pertinent family history.  History  Substance Use Topics  . Smoking status: Current Every Day Smoker -- 0.25 packs/day for 7 years    Types: Cigarettes  . Smokeless tobacco: Not on file  . Alcohol Use: 3.0 oz/week    5 Cans of beer per week     Comment: 5      Review of Systems  All other systems reviewed and are negative.    Allergies  Review of patient's allergies indicates no known allergies.  Home Medications   Current Outpatient Rx  Name  Route  Sig  Dispense   Refill  . ARIPiprazole (ABILIFY) 5 MG tablet   Oral   Take 15 mg by mouth daily. mood         . gabapentin (NEURONTIN) 600 MG tablet   Oral   Take 600 mg by mouth 3 (three) times daily.         . hydrOXYzine (ATARAX/VISTARIL) 50 MG tablet   Oral   Take 50 mg by mouth daily as needed for anxiety. For anxiety         . ibuprofen (ADVIL,MOTRIN) 800 MG tablet   Oral   Take 800 mg by mouth 3 (three) times daily.         Marland Kitchen levothyroxine (SYNTHROID, LEVOTHROID) 100 MCG tablet   Oral   Take 100 mcg by mouth daily.         . mirtazapine (REMERON) 15 MG tablet   Oral   Take 1 tablet (15 mg total) by mouth at bedtime. For depression/sleep   30 tablet   0   . naproxen (NAPROSYN) 500 MG tablet   Oral   Take 1 tablet (500 mg total) by mouth 2 (two) times daily as needed. For pain         . Vilazodone HCl (VIIBRYD) 10 MG TABS   Oral   Take 20 mg by mouth daily.           BP 122/88  Pulse 79  Temp(Src) 97.9 F (36.6 C) (Oral)  Resp 20  SpO2 100%  Physical Exam  Nursing note and vitals reviewed. Constitutional: He is oriented to person, place, and time. He appears well-developed and well-nourished.  HENT:  Head: Normocephalic and atraumatic.  Nose: Nose normal.  Mouth/Throat: Oropharynx is clear and moist. No oropharyngeal exudate.  Eyes: Conjunctivae and EOM are normal. Pupils are equal, round, and reactive to light. Right eye exhibits no discharge. Left eye exhibits no discharge. No scleral icterus.  Neck: Normal range of motion. Neck supple. No JVD present.  Cardiovascular: Normal rate, regular rhythm, normal heart sounds and intact distal pulses.  Exam reveals no gallop and no friction rub.   No murmur heard. Pulmonary/Chest: Effort normal and breath sounds normal. No respiratory distress. He has no wheezes. He has no rales. He exhibits no tenderness.  Abdominal: Soft. Bowel sounds are normal. He exhibits no distension and no mass. There is no tenderness.  There is no rebound and no guarding.  Musculoskeletal: Normal range of motion. He exhibits no edema and no tenderness.  Neurological: He is alert and oriented to person, place, and time.  Skin: Skin is warm and dry.  Psychiatric: He has a normal mood and affect. His behavior is normal. Judgment and thought content normal.    ED Course  Procedures (including critical care time)  Labs Reviewed  CBC - Abnormal; Notable for the following:    WBC 3.8 (*)    All other components within normal limits  COMPREHENSIVE METABOLIC PANEL - Abnormal; Notable for the following:    BUN 29 (*)    AST 324 (*)    ALT 266 (*)    All other components within normal limits  SALICYLATE LEVEL - Abnormal; Notable for the following:    Salicylate Lvl <2.0 (*)    All other components within normal limits  URINE RAPID DRUG SCREEN (HOSP PERFORMED) - Abnormal; Notable for the following:    Cocaine POSITIVE (*)    Tetrahydrocannabinol POSITIVE (*)    All other components within normal limits  ACETAMINOPHEN LEVEL  ETHANOL   No results found.   No diagnosis found.    MDM  52 year old male, wanting detox, and help with SI. Patient is medically clear at this time. Will move to path C. I have discussed the patient with the act team, who will see the patient. Psych hold orders and CIWA protocol has been ordered.        Roxy Horseman, PA-C 06/24/12 1552

## 2012-06-24 NOTE — ED Notes (Signed)
Sitter at bedside.

## 2012-06-24 NOTE — BH Assessment (Addendum)
Assessment Note   Edward [name] is an 52 y.o. male that was assessed this day after presenting to [facility] reporting SI, no plan and requesting detox for alcohol, crack cocaine, and THC.  Pt reports he has had plans recently of "bleeding out," but denies current plan.  Pt denies HI.  Pt reports auditory hallucinations, stating the voices tell him he would be better off dead.  Pt denies HI.  Pt admits to daily use of ETOH, THC, and crack.  Pt reports he drinks 2 12 packs of beer per day, last use 3/2 and pt drank one 12 pack beer.  He reports smoking 2 grams of THC per day, last use on 3/2 and pt smoked one gram.  Pt reports he uses 1 gram of crack daily, last use 3/2 and he used 1 gram.  Pt stated he tried to go to [facility] after being discharged from [facility] in 12/13 after last admission, but they wouldn't take him because of his Medicaid.  Pt stated he still goes to [facility] and takes his medications as prescribed, but doesn't feel like they are working because of increasing depression.  Pt stated current stressors are not being able to talk to his daughter and missing his sister in [place].  Pt is also homeless and has financial stress, but reports panhandling to get drugs.  Pt stated he can't live like this and needs help.  Pt denies current withdrawal sx and denies hx of seizures or blackouts.  Pt presented to [facility] on 2/17 for similar sx, received telepsych, and was discharged.  Completed assessment, assessment notification, and faxed to [facility] to run for possible admission.  Updated ED staff.  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 Thyroid disease   . Hepatitis   . Neuromuscular disorder     pinched nerve  . Hypothyroidism   . Mental disorder   . Depression   . Anxiety and depression   . Major depression   . PTSD (post-traumatic stress disorder)    Axis IV: economic problems, housing  problems, occupational problems, other psychosocial or environmental problems, problems related to social environment, problems with access to health care services and problems with primary support group Axis V: 31-40 impairment in reality testing  Past Medical History:  Past Medical History  Diagnosis Date  . Thyroid disease   . Hepatitis   . Neuromuscular disorder     pinched nerve  . Hypothyroidism   . Mental disorder   . Depression   . Anxiety and depression   . Major depression   . PTSD (post-traumatic stress disorder)     History reviewed. No pertinent past surgical history.  Family History: History reviewed. No pertinent family history.  Social History:  reports that he has been smoking Cigarettes.  He has a 1.75 pack-year smoking history. He does not have any smokeless tobacco history on file. He reports that he drinks about 3.0 ounces of alcohol per week. He reports that he uses illicit drugs ("Crack" cocaine, Marijuana, and Cocaine).  Additional Social History:  Alcohol / Drug Use Pain Medications: see MAR Prescriptions: see MAR Over the Counter: see MAR History of alcohol / drug use?: Yes Longest period of sobriety (when/how long): unknown Negative Consequences of Use: Financial;Personal relationships Withdrawal Symptoms:  (pt denies) Substance #1 Name of Substance 1: Alcohol 1 - Age of First Use: 12 1 - Amount (size/oz): 2  12 packs beer 1 - Frequency: daily 1 - Duration: ongoing since 04/09/13 1 - Last Use / Amount: 06/23/12 - 12 pack beer Substance #2 Name of Substance 2: THC 2 - Age of First Use: 12 2 - Amount (size/oz): 2 grams 2 - Frequency: daily 2 - Duration: ongoing since 04/09/13 2 - Last Use / Amount: 06/23/12 - 2 grams Substance #3 Name of Substance 3: Crack cocaine 3 - Age of First Use: 35 3 - Amount (size/oz): 1 gram 3 - Frequency: daily 3 - Duration: ongoing since 04/09/13 3 - Last Use / Amount: 06/23/12 - 1 gram  CIWA: CIWA-Ar BP: 122/78  mmHg Pulse Rate: 79 Nausea and Vomiting: no nausea and no vomiting Tactile Disturbances: none Tremor: no tremor Auditory Disturbances: not present Paroxysmal Sweats: no sweat visible Visual Disturbances: not present Anxiety: no anxiety, at ease Headache, Fullness in Head: none present Agitation: normal activity Orientation and Clouding of Sensorium: oriented and can do serial additions CIWA-Ar Total: 0 COWS:    Allergies: No Known Allergies  Home Medications:  (Not in a hospital admission)  OB/GYN Status:  No LMP for male patient.  General Assessment Data Location of Assessment: [facility] ED Living Arrangements: Other (Comment) (homeless) Can pt return to current living arrangement?: Yes Admission Status: Voluntary Is patient capable of signing voluntary admission?: Yes Transfer from: Acute Hospital Referral Source: Self/Family/Friend  Education Status Is patient currently in school?: No  Risk to self Suicidal Ideation: Yes-Currently Present Suicidal Intent: Yes-Currently Present Is patient at risk for suicide?: Yes Suicidal Plan?: No-Not Currently/Within Last 6 Months Access to Means: No What has been your use of drugs/alcohol within the last 12 months?: Daily use of ETOH, THC, and crack cocaine Previous Attempts/Gestures: Yes How many times?: 2 Other Self Harm Risks: pt denies Triggers for Past Attempts: Unpredictable;Hallucinations Intentional Self Injurious Behavior: None Family Suicide History: No Recent stressful life event(s): Other (Comment);Financial Problems (Ongoing SA, homeless) Persecutory voices/beliefs?: Yes Depression: Yes Depression Symptoms: Despondent;Loss of interest in usual pleasures;Feeling worthless/self pity;Feeling angry/irritable Substance abuse history and/or treatment for substance abuse?: Yes Suicide prevention information given to non-admitted patients: Not applicable  Risk to Others Homicidal Ideation: No Thoughts of Harm to Others:  No Current Homicidal Intent: No Current Homicidal Plan: No Access to Homicidal Means: No Identified Victim: pt denies History of harm to others?: No Assessment of Violence: None Noted Violent Behavior Description: na - pt calm, cooperative Does patient have access to weapons?: No Criminal Charges Pending?: No Does patient have a court date: No  Psychosis Hallucinations: Auditory;With command (voices telling him he would be better off dead) Delusions: None noted  Mental Status Report Appear/Hygiene: Disheveled Eye Contact: Fair Motor Activity: Unremarkable Speech: Logical/coherent Level of Consciousness: Alert Mood: Depressed Affect: Appropriate to circumstance Anxiety Level: Minimal Thought Processes: Coherent;Relevant Judgement: Impaired Orientation: Person;Place;Time;Situation Obsessive Compulsive Thoughts/Behaviors: None  Cognitive Functioning Concentration: Decreased Memory: Recent Intact;Remote Intact IQ: Average Insight: Poor Impulse Control: Poor Appetite: Poor Weight Loss: 0 Weight Gain: 0 Sleep: Increased Total Hours of Sleep:  (10-12 hrs per day) Vegetative Symptoms: None  ADLScreening [facility] Assessment Services) Patient's cognitive ability adequate to safely complete daily activities?: Yes Patient able to express need for assistance with ADLs?: Yes Independently performs ADLs?: Yes (appropriate for developmental age)  Abuse/Neglect [facility]) Physical Abuse: Denies Verbal Abuse: Denies Sexual Abuse: Denies  Prior Inpatient Therapy Prior Inpatient Therapy: Yes Prior Therapy Dates: 1990's, 2011, 03/2012 Prior Therapy Facilty/Lori Liew(s): Facilities in [place], in [place] (unknown) and [facility] Reason for Treatment:  SI/SA/Depression  Prior Outpatient Therapy Prior Outpatient Therapy: Yes Prior Therapy Dates: 2012, Current Prior Therapy Facilty/Shadoe Cryan(s): [facility] (2012), [facility] (current) Reason for Treatment: Depression, SA  ADL Screening (condition at time of  admission) Patient's cognitive ability adequate to safely complete daily activities?: Yes Patient able to express need for assistance with ADLs?: Yes Independently performs ADLs?: Yes (appropriate for developmental age)  Home Assistive Devices/Equipment Home Assistive Devices/Equipment: None    Abuse/Neglect Assessment (Assessment to be complete while patient is alone) Physical Abuse: Denies Verbal Abuse: Denies Sexual Abuse: Denies Exploitation of patient/patient's resources: Denies Self-Neglect: Denies Values / Beliefs Cultural Requests During Hospitalization: None Spiritual Requests During Hospitalization: None Consults Spiritual Care Consult Needed: No Social Work Consult Needed: No Merchant navy officer (For Healthcare) Advance Directive: Patient does not have advance directive;Patient would not like information    Additional Information 1:1 In Past 12 Months?: No CIRT Risk: No Elopement Risk: No Does patient have medical clearance?: Yes     Disposition:  Disposition Initial Assessment Completed: Yes Disposition of Patient: Referred to;Inpatient treatment program Type of inpatient treatment program: Adult Patient referred to: Other (Comment) (Pending [facility])  On Site Evaluation by:   Reviewed with Physician:  Ivar Drape, PA   Caryl Comes 06/24/2012 5:43 PM

## 2012-06-24 NOTE — ED Notes (Signed)
Pt here requesting detox from cocaine and ETOH; pt sts last use cocaine 2 days ago and last use ETOH last night; pt c/o SI and increase depression

## 2012-06-24 NOTE — ED Notes (Signed)
ACt team at the bedsside

## 2012-06-24 NOTE — BH Assessment (Signed)
[facility] Assessment Progress Note   Pt accepted to [facility] by Donell Sievert, PA to Dr. Dub Mikes.  Room 303-2.  Patient has signed voluntary admission papers and is awaiting transport.  Dr. Linwood Dibbles informed as was the nursing staff.

## 2012-06-24 NOTE — ED Notes (Signed)
Pt wanded by security. 

## 2012-06-25 ENCOUNTER — Encounter (HOSPITAL_COMMUNITY): Payer: Self-pay | Admitting: *Deleted

## 2012-06-25 ENCOUNTER — Inpatient Hospital Stay (HOSPITAL_COMMUNITY)
Admission: EM | Admit: 2012-06-25 | Discharge: 2012-07-08 | DRG: 897 | Disposition: A | Discharge status: Home / Self Care | Payer: Medicaid Other | Source: Intra-hospital | Attending: Psychiatry | Admitting: Psychiatry

## 2012-06-25 DIAGNOSIS — F1020 Alcohol dependence, uncomplicated: Principal | ICD-10-CM | POA: Diagnosis present

## 2012-06-25 DIAGNOSIS — F4310 Post-traumatic stress disorder, unspecified: Secondary | ICD-10-CM | POA: Diagnosis present

## 2012-06-25 DIAGNOSIS — Z79899 Other long term (current) drug therapy: Secondary | ICD-10-CM

## 2012-06-25 DIAGNOSIS — E039 Hypothyroidism, unspecified: Secondary | ICD-10-CM | POA: Diagnosis present

## 2012-06-25 DIAGNOSIS — F329 Major depressive disorder, single episode, unspecified: Secondary | ICD-10-CM | POA: Diagnosis present

## 2012-06-25 DIAGNOSIS — F1410 Cocaine abuse, uncomplicated: Secondary | ICD-10-CM | POA: Diagnosis present

## 2012-06-25 MED ORDER — MAGNESIUM HYDROXIDE 400 MG/5ML PO SUSP: 30.0000 mL | Freq: Every day | ORAL | Status: DC | PRN

## 2012-06-25 MED ORDER — DIPHENHYDRAMINE HCL 50 MG PO CAPS
50.0000 mg | ORAL_CAPSULE | Freq: Every evening | ORAL | Status: DC | PRN
  Administered 2012-06-28: 50 mg via ORAL
  Filled 2012-06-25 (×16): qty 1

## 2012-06-25 MED ORDER — LEVOTHYROXINE SODIUM 100 MCG PO TABS
100.0000 ug | ORAL_TABLET | Freq: Every day | ORAL | Status: DC
  Administered 2012-06-25 – 2012-07-08 (×12): 100 ug via ORAL
  Filled 2012-06-25 (×4): qty 1
  Filled 2012-06-25: qty 3
  Filled 2012-06-25 (×11): qty 1

## 2012-06-25 MED ORDER — ADULT MULTIVITAMIN W/MINERALS CH
1.0000 | ORAL_TABLET | Freq: Every day | ORAL | Status: DC
Start: 2012-06-25 — End: 2012-07-08
  Administered 2012-06-25 – 2012-07-08 (×12): 1 via ORAL
  Filled 2012-06-25 (×16): qty 1

## 2012-06-25 MED ORDER — GABAPENTIN 600 MG PO TABS
600.0000 mg | ORAL_TABLET | Freq: Three times a day (TID) | ORAL | Status: DC
  Administered 2012-06-25 – 2012-07-08 (×39): 600 mg via ORAL
  Filled 2012-06-25 (×17): qty 1
  Filled 2012-06-25: qty 12
  Filled 2012-06-25 (×14): qty 1
  Filled 2012-06-25: qty 12
  Filled 2012-06-25 (×6): qty 1
  Filled 2012-06-25: qty 12
  Filled 2012-06-25 (×6): qty 1

## 2012-06-25 MED ORDER — MIRTAZAPINE 15 MG PO TABS
15.0000 mg | ORAL_TABLET | Freq: Every day | ORAL | Status: DC
  Administered 2012-06-25 – 2012-06-26 (×2): 15 mg via ORAL
  Filled 2012-06-25 (×3): qty 1

## 2012-06-25 MED ORDER — IBUPROFEN 800 MG PO TABS
800.0000 mg | ORAL_TABLET | Freq: Three times a day (TID) | ORAL | Status: DC
  Administered 2012-06-25 – 2012-06-28 (×11): 800 mg via ORAL
  Filled 2012-06-25 (×13): qty 1

## 2012-06-25 MED ORDER — ALUM & MAG HYDROXIDE-SIMETH 200-200-20 MG/5ML PO SUSP
30.0000 mL | ORAL | Status: DC | PRN
  Administered 2012-06-26 – 2012-06-29 (×2): 30 mL via ORAL

## 2012-06-25 MED ORDER — ARIPIPRAZOLE 15 MG PO TABS
15.0000 mg | ORAL_TABLET | Freq: Every day | ORAL | Status: DC
  Administered 2012-06-25 – 2012-06-26 (×2): 15 mg via ORAL
  Filled 2012-06-25 (×4): qty 1

## 2012-06-25 MED ORDER — ONDANSETRON 4 MG PO TBDP
4.0000 mg | ORAL_TABLET | Freq: Four times a day (QID) | ORAL | Status: AC | PRN
  Administered 2012-06-26: 4 mg via ORAL
  Filled 2012-06-25: qty 1

## 2012-06-25 MED ORDER — VITAMIN B-1 100 MG PO TABS
100.0000 mg | ORAL_TABLET | Freq: Every day | ORAL | Status: DC
  Administered 2012-06-26 – 2012-07-08 (×11): 100 mg via ORAL
  Filled 2012-06-25 (×15): qty 1

## 2012-06-25 MED ORDER — THIAMINE HCL 100 MG/ML IJ SOLN: 100.0000 mg | Freq: Once | INTRAMUSCULAR | Status: DC

## 2012-06-25 MED ORDER — VILAZODONE HCL 10 MG PO TABS
20.0000 mg | ORAL_TABLET | Freq: Every day | ORAL | Status: DC
  Administered 2012-06-25 – 2012-06-27 (×3): 20 mg via ORAL
  Filled 2012-06-25 (×4): qty 2

## 2012-06-25 MED ORDER — VILAZODONE HCL 10 MG PO TABS
20.0000 mg | ORAL_TABLET | Freq: Every day | ORAL | Status: DC
Start: 2012-06-25 — End: 2012-06-25

## 2012-06-25 MED ORDER — LOPERAMIDE HCL 2 MG PO CAPS
2.0000 mg | ORAL_CAPSULE | ORAL | Status: AC | PRN
  Administered 2012-06-25 – 2012-06-27 (×2): 4 mg via ORAL

## 2012-06-25 MED ORDER — HYDROXYZINE HCL 50 MG PO TABS
50.0000 mg | ORAL_TABLET | Freq: Every day | ORAL | Status: DC | PRN
  Administered 2012-06-25 – 2012-07-08 (×8): 50 mg via ORAL
  Filled 2012-06-25 (×4): qty 1

## 2012-06-25 MED ORDER — NICOTINE 14 MG/24HR TD PT24
14.0000 mg | MEDICATED_PATCH | Freq: Every day | TRANSDERMAL | Status: DC
  Administered 2012-06-25 – 2012-07-08 (×13): 14 mg via TRANSDERMAL
  Filled 2012-06-25 (×18): qty 1

## 2012-06-25 MED ORDER — ACETAMINOPHEN 325 MG PO TABS: 650.0000 mg | ORAL_TABLET | Freq: Four times a day (QID) | ORAL | Status: DC | PRN

## 2012-06-25 NOTE — Progress Notes (Signed)
Patient ID: Edward [name], male   DOB: 14-Mar-1961, 52 y.o.   MRN: [id number]  D: Pt was cooperative but slightly irritable during the adm process. Informed the writer that he was last here 04/09/12. Pt is homeless and states the apt's that he's looked at are like slums.  Pt admits to drinking daily, but states the amt varies. Pt admits to having a/h and passive SI, but contracts for safety.

## 2012-06-25 NOTE — [facility] Suicide Risk Assessment (Signed)
Suicide Risk Assessment  Admission Assessment     Nursing information obtained from:    Demographic factors:    Current Mental Status:    Loss Factors:    Historical Factors:    Risk Reduction Factors:     CLINICAL FACTORS:   Depression:   Comorbid alcohol abuse/dependence Alcohol/Substance Abuse/Dependencies  COGNITIVE FEATURES THAT CONTRIBUTE TO RISK:  Closed-mindedness Thought constriction (tunnel vision)    SUICIDE RISK:   Moderate:  Frequent suicidal ideation with limited intensity, and duration, some specificity in terms of plans, no associated intent, good self-control, limited dysphoria/symptomatology, some risk factors present, and identifiable protective factors, including available and accessible social support.  PLAN OF CARE: Supportive approach/coping skills/relapse prevention                               Pursue Detox, Address co morbidities  I certify that inpatient services furnished can reasonably be expected to improve the patient's condition.  Edward [name] A 06/25/2012, 5:22 PM

## 2012-06-25 NOTE — H&P (Signed)
Psychiatric Admission Assessment Adult  Patient Identification:  Edward [name]  Date of Evaluation:  06/25/2012  Chief Complaint:  Polysubstance Abuse  History of Present Illness: This is a 52 year old Caucasian male, admitted to [facility] from the [facility] ED with complaints of suicidal thoughts, 3 straight days of substance abuse.  He is requesting detoxification treatment. During this admission assessment, Patient reports, "I can't get rid of my detox symptoms. I was detoxifying from cocaine, alcohol and weed. I used these substances last Sunday. I was using for 3 days straight. I was here last December for my depression. I was discharged December the 17th. I stayed sober through December 25th, then relapsed and have been using since then. One of the reasons why I use drugs is that when I get my disability checks, that creates and opportunity for me to use because I have the money. Drugs help change my mood. They keep it high, and yet, I stay miserable using because I know it was wrong. When I was discharged from here last time, I was suppose to go to [facility] to continue substance abuse treatment. However, the [facility] people would not let me come because my insurance is from the [facility], but this Floydene Flock was in [place]. But, I did go to [facility] for my medicines, and would like to go back to [place] after discharge. I feel very depressed. My depression today is at #10. I also have been hearing voices. This led to my feeling suicidal and thoughts to cut myself where I can bleed out and die. I feel very safe here as I do not have any plans to hurt me here".  O: Mr. [name] have been a patient in this hospital numerous times.   Elements:  Location:  [facility], adult unit. Quality:  Daily abuse of substances, did cocaine, drank alcohol, smoke THC for straight 3 days". Severity:  "I became suicidal and started hearing voices". Timing:  "It satrted 3 days ago". Duration:  "I have  abusing substances since Dec 25th till 3 days ago". Context:  "I'm homeless, suicidal, thought of bleeding out and die, financially stressed, very depressed".  Associated Signs/Synptoms:  Depression Symptoms:  depressed mood, anhedonia, insomnia, psychomotor agitation, feelings of worthlessness/guilt, hopelessness, suicidal thoughts with specific plan,  (Hypo) Manic Symptoms:  Hallucinations, Impulsivity, Irritable Mood,  Anxiety Symptoms:  Excessive Worry,  Psychotic Symptoms:  Hallucinations: Auditory  PTSD Symptoms: Had a traumatic exposure:  "I can't talk about it"  Psychiatric Specialty Exam: Physical Exam  Constitutional: He is oriented to person, place, and time. He appears well-developed and well-nourished.  HENT:  Head: Normocephalic.  Eyes: Pupils are equal, round, and reactive to light.  Neck: Normal range of motion.  Cardiovascular: Normal rate.   Respiratory: Effort normal.  GI: Soft.  Musculoskeletal: Normal range of motion.  Neurological: He is alert and oriented to person, place, and time.  Skin: Skin is warm and dry.  Psychiatric: His speech is normal. His mood appears anxious. He is withdrawn. Thought content is not paranoid and not delusional. Cognition and memory are normal. He expresses inappropriate judgment. He exhibits a depressed mood (Rated depression at #10). He expresses suicidal (Claimed that he is safe here.) ideation. He expresses no homicidal ideation. He expresses no suicidal plans and no homicidal plans.    Review of Systems  Constitutional: Negative.   HENT: Negative.   Eyes: Negative.   Respiratory: Negative.   Cardiovascular: Negative.   Gastrointestinal: Negative.  Genitourinary: Negative.   Musculoskeletal: Positive for myalgias.  Skin: Negative for itching and rash.       Patient presents with numerous bodily tattoos to different areas of the body.  Neurological: Positive for tremors.  Endo/Heme/Allergies: Negative.    Psychiatric/Behavioral: Positive for depression (Rated depression at #10), suicidal ideas (Able to contarct for safety), hallucinations and substance abuse (Hx alcoholism, cocaine/cannabis abuse). Negative for memory loss. The patient is nervous/anxious and has insomnia.     Blood pressure 86/53, pulse 81, temperature 98.4 F (36.9 C), temperature source Oral, resp. rate 18, height 5' 7.5" (1.715 m), weight 83.462 kg (184 lb).Body mass index is 28.38 kg/(m^2).  General Appearance: Disheveled  Eye Contact::  Poor  Speech:  Clear and Coherent  Volume:  Normal  Mood:  Depressed and rated depression at #10  Affect:  Congruent, Depressed and Flat  Thought Process:  Coherent and Intact  Orientation:  Full (Time, Place, and Person)  Thought Content:  Hallucinations: Auditory and Rumination  Suicidal Thoughts:  Yes.  without intent/plan  Homicidal Thoughts:  No  Memory:  Immediate;   Good Recent;   Good Remote;   Good  Judgement:  Impaired  Insight:  Shallow  Psychomotor Activity:  Tremor  Concentration:  Fair  Recall:  Good  Akathisia:  No  Handed:  Right  AIMS (if indicated):     Assets:  Desire for Improvement  Sleep:  Number of Hours: 3.25    Past Psychiatric History: Diagnosis:Alcohol dependence, Cocaine abuse, PTSD, Substance induced mood disorder.   Hospitalizations: [facility] x 3 or more  Outpatient Care: [facility]  Substance Abuse Care: None reported  Self-Mutilation: None reported  Suicidal Attempts: Denies attempt, admits thoughts.  Violent Behaviors: None reported   Past Medical History:   Past Medical History  Diagnosis Date  . Thyroid disease   . Neuromuscular disorder     pinched nerve  . Hypothyroidism   . Mental disorder   . Depression   . Anxiety and depression   . Major depression   . PTSD (post-traumatic stress disorder)   . Hepatitis     hep C    Allergies:  No Known Allergies  PTA Medications: Prescriptions prior to admission  Medication Sig  Dispense Refill  . ARIPiprazole (ABILIFY) 5 MG tablet Take 15 mg by mouth daily. mood      . gabapentin (NEURONTIN) 600 MG tablet Take 600 mg by mouth 3 (three) times daily.      . hydrOXYzine (ATARAX/VISTARIL) 50 MG tablet Take 50 mg by mouth daily as needed for anxiety. For anxiety      . ibuprofen (ADVIL,MOTRIN) 800 MG tablet Take 800 mg by mouth 3 (three) times daily.      Marland Kitchen levothyroxine (SYNTHROID, LEVOTHROID) 100 MCG tablet Take 100 mcg by mouth daily.      . mirtazapine (REMERON) 15 MG tablet Take 1 tablet (15 mg total) by mouth at bedtime. For depression/sleep  30 tablet  0  . naproxen (NAPROSYN) 500 MG tablet Take 1 tablet (500 mg total) by mouth 2 (two) times daily as needed. For pain      . Vilazodone HCl (VIIBRYD) 10 MG TABS Take 20 mg by mouth daily.        Previous Psychotropic Medications:  Medication/Dose  See medication lists above.               Substance Abuse History in the last 12 months:  yes  Consequences of Substance Abuse: Medical Consequences:  Liver  damage, Possible death by overdose Legal Consequences:  Arrests, jail time, Loss of driving privilege. Family Consequences:  Family discord, divorce and or separation.  Social History:  reports that he has been smoking Cigarettes.  He has a 1.75 pack-year smoking history. He does not have any smokeless tobacco history on file. He reports that he drinks about 3.0 ounces of alcohol per week. He reports that he uses illicit drugs ("Crack" cocaine, Marijuana, and Cocaine). Additional Social History:  Current Place of Residence: I live in a tent in [place]).   Place of Birth:  [place], [place]   Family Members: "My sister and daughter"  Marital Status:  Single  Children: 1  Sons: 0  Daughters: 0  Relationships: Single  Education:  GED  Educational Problems/Performance: Completed GED  Religious Beliefs/Practices: None reported  History of Abuse (Emotional/Phsycial/Sexual): "It's hard for me to talk  about it"  Occupational Experiences: Disabled  Hotel manager History:  Copywriter, advertising History: None reported  Hobbies/Interests: None reported  Family History:  No family history on file.  Results for orders placed during the hospital encounter of 06/24/12 (from the past 72 hour(s))  ACETAMINOPHEN LEVEL     Status: None   Collection Time    06/24/12  1:39 PM      Result Value Range   Acetaminophen (Tylenol), Serum <15.0  10 - 30 ug/mL   Comment:            THERAPEUTIC CONCENTRATIONS VARY     SIGNIFICANTLY. A RANGE OF 10-30     ug/mL MAY BE AN EFFECTIVE     CONCENTRATION FOR MANY PATIENTS.     HOWEVER, SOME ARE BEST TREATED     AT CONCENTRATIONS OUTSIDE THIS     RANGE.     ACETAMINOPHEN CONCENTRATIONS     >150 ug/mL AT 4 HOURS AFTER     INGESTION AND >50 ug/mL AT 12     HOURS AFTER INGESTION ARE     OFTEN ASSOCIATED WITH TOXIC     REACTIONS.  CBC     Status: Abnormal   Collection Time    06/24/12  1:39 PM      Result Value Range   WBC 3.8 (*) 4.0 - 10.5 K/uL   RBC 4.59  4.22 - 5.81 MIL/uL   Hemoglobin 13.9  13.0 - 17.0 g/dL   HCT 16.1  09.6 - 04.5 %   MCV 86.3  78.0 - 100.0 fL   MCH 30.3  26.0 - 34.0 pg   MCHC 35.1  30.0 - 36.0 g/dL   RDW 40.9  81.1 - 91.4 %   Platelets 168  150 - 400 K/uL  COMPREHENSIVE METABOLIC PANEL     Status: Abnormal   Collection Time    06/24/12  1:39 PM      Result Value Range   Sodium 139  135 - 145 mEq/L   Potassium 4.4  3.5 - 5.1 mEq/L   Chloride 102  96 - 112 mEq/L   CO2 28  19 - 32 mEq/L   Glucose, Bld 83  70 - 99 mg/dL   BUN 29 (*) 6 - 23 mg/dL   Creatinine, Ser 7.82  0.50 - 1.35 mg/dL   Calcium 9.0  8.4 - 95.6 mg/dL   Total Protein 7.8  6.0 - 8.3 g/dL   Albumin 3.6  3.5 - 5.2 g/dL   AST 213 (*) 0 - 37 U/L   ALT 266 (*) 0 - 53 U/L   Alkaline Phosphatase 117  39 - 117 U/L   Total Bilirubin 0.6  0.3 - 1.2 mg/dL   GFR calc non Af Amer >90  >90 mL/min   GFR calc Af Amer >90  >90 mL/min   Comment:            The eGFR has been  calculated     using the CKD EPI equation.     This calculation has not been     validated in all clinical     situations.     eGFR's persistently     <90 mL/min signify     possible Chronic Kidney Disease.  ETHANOL     Status: None   Collection Time    06/24/12  1:39 PM      Result Value Range   Alcohol, Ethyl (B) <11  0 - 11 mg/dL   Comment:            LOWEST DETECTABLE LIMIT FOR     SERUM ALCOHOL IS 11 mg/dL     FOR MEDICAL PURPOSES ONLY  SALICYLATE LEVEL     Status: Abnormal   Collection Time    06/24/12  1:39 PM      Result Value Range   Salicylate Lvl <2.0 (*) 2.8 - 20.0 mg/dL  URINE RAPID DRUG SCREEN (HOSP PERFORMED)     Status: Abnormal   Collection Time    06/24/12  1:55 PM      Result Value Range   Opiates NONE DETECTED  NONE DETECTED   Cocaine POSITIVE (*) NONE DETECTED   Benzodiazepines NONE DETECTED  NONE DETECTED   Amphetamines NONE DETECTED  NONE DETECTED   Tetrahydrocannabinol POSITIVE (*) NONE DETECTED   Barbiturates NONE DETECTED  NONE DETECTED   Comment:            DRUG SCREEN FOR MEDICAL PURPOSES     ONLY.  IF CONFIRMATION IS NEEDED     FOR ANY PURPOSE, NOTIFY LAB     WITHIN 5 DAYS.                LOWEST DETECTABLE LIMITS     FOR URINE DRUG SCREEN     Drug Class       Cutoff (ng/mL)     Amphetamine      1000     Barbiturate      200     Benzodiazepine   200     Tricyclics       300     Opiates          300     Cocaine          300     THC              50   Psychological Evaluations:  Assessment:   AXIS I:  Post Traumatic Stress Disorder, Substance Induced Mood Disorder and Alcohol dependence, Cocaine abuse AXIS II:  Deferred AXIS III:   Past Medical History  Diagnosis Date  . Thyroid disease   . Neuromuscular disorder     pinched nerve  . Hypothyroidism   . Mental disorder   . Depression   . Anxiety and depression   . Major depression   . PTSD (post-traumatic stress disorder)   . Hepatitis     hep C   AXIS IV:  economic  problems, housing problems, occupational problems, other psychosocial or environmental problems and substancae abuse issues AXIS V:  11-20 some danger of hurting self or others possible OR occasionally  fails to maintain minimal personal hygiene OR gross impairment in communication  Treatment Plan/Recommendations: 1. Admit for crisis management and stabilization, estimated length of stay 3-5 days.  2. Medication management to reduce current symptoms to base line and improve the patient's overall level of functioning  3. Treat health problems as indicated.  4. Develop treatment plan to decrease risk of relapse upon discharge and the need for readmission.  5. Psycho-social education regarding relapse prevention and self care.  6. Health care follow up as needed for medical problems.  7. Review, reconcile, and reinstate any pertinent home medications for other health issues where appropriate. 8. Call for consults with hospitalist for any additional specialty patient care services as needed.  Treatment Plan Summary: Daily contact with patient to assess and evaluate symptoms and progress in treatment Medication management Supportive approach/coping skills/relapse prevention Reassess and address the co morbidities Current Medications:  Current Facility-Administered Medications  Medication Dose Route Frequency Provider Last Rate Last Dose  . alum & mag hydroxide-simeth (MAALOX/MYLANTA) 200-200-20 MG/5ML suspension 30 mL  30 mL Oral Q4H PRN Kerry Hough, PA      . ARIPiprazole (ABILIFY) tablet 15 mg  15 mg Oral Daily Kerry Hough, PA   15 mg at 06/25/12 0834  . diphenhydrAMINE (BENADRYL) capsule 50 mg  50 mg Oral QHS,MR X 1 Kerry Hough, PA      . gabapentin (NEURONTIN) tablet 600 mg  600 mg Oral TID Kerry Hough, PA   600 mg at 06/25/12 1610  . hydrOXYzine (ATARAX/VISTARIL) tablet 50 mg  50 mg Oral Daily PRN Kerry Hough, PA      . ibuprofen (ADVIL,MOTRIN) tablet 800 mg  800 mg Oral  TID Kerry Hough, PA   800 mg at 06/25/12 0834  . levothyroxine (SYNTHROID, LEVOTHROID) tablet 100 mcg  100 mcg Oral Daily Kerry Hough, PA   100 mcg at 06/25/12 0834  . loperamide (IMODIUM) capsule 2-4 mg  2-4 mg Oral PRN Kerry Hough, PA      . magnesium hydroxide (MILK OF MAGNESIA) suspension 30 mL  30 mL Oral Daily PRN Kerry Hough, PA      . mirtazapine (REMERON) tablet 15 mg  15 mg Oral QHS Kerry Hough, PA      . multivitamin with minerals tablet 1 tablet  1 tablet Oral Daily Kerry Hough, PA   1 tablet at 06/25/12 [phone]  . nicotine (NICODERM CQ - dosed in mg/24 hours) patch 14 mg  14 mg Transdermal Q0600 Kerry Hough, PA   14 mg at 06/25/12 0646  . ondansetron (ZOFRAN-ODT) disintegrating tablet 4 mg  4 mg Oral Q6H PRN Kerry Hough, PA      . thiamine (B-1) injection 100 mg  100 mg Intramuscular Once Kerry Hough, [place]      . Melene Muller ON 06/26/2012] thiamine (VITAMIN B-1) tablet 100 mg  100 mg Oral Daily Kerry Hough, PA      . Vilazodone HCl (VIIBRYD) TABS 20 mg  20 mg Oral Daily Rachael Fee, MD   20 mg at 06/25/12 5409    Observation Level/Precautions:  15 minute checks  Laboratory:  Reviewed ED lab findings on file.  Psychotherapy:  Group sessions/AA/NA meetings  Medications: See medication lists above.   Consultations: As needed    Discharge Concerns:  Maintaining sobriety  Estimated LOS: 5-7 days  Other:     I certify that inpatient services furnished can reasonably be  expected to improve the patient's condition.   Armandina Stammer I 3/4/201410:06 AM

## 2012-06-25 NOTE — Progress Notes (Signed)
D: Patient denies HI and visual hallucinations and admits to some on and off thoughts of SI and auditory hallucinations of voice telling him he would be better off dead; patient reports he is sleeping but its restless because he has a lot on his mind A: Monitored q 15 minutes; patient encouraged to attend groups; patient educated about medications; patient given medications per physician orders; patient encouraged to express feelings and/or concerns  R: Patient is not participating in groups today but is appropriate with circumstances; patient is more concerned with his pain at the moment; patient's interaction with staff and peers is appropriate; patient was able to set goal to talk with staff 1:1 when having feelings of SI; patient is taking medications as prescribed and tolerating medications;

## 2012-06-25 NOTE — Tx Team (Signed)
Initial Interdisciplinary Treatment Plan  PATIENT STRENGTHS: (choose at least two) Ability for insight Average or above average intelligence Capable of independent living Communication skills General fund of knowledge Physical Health Religious Affiliation Supportive family/friends Work skills  PATIENT STRESSORS: Financial difficulties Substance abuse Traumatic event   PROBLEM LIST: Problem List/Patient Goals Date to be addressed Date deferred Reason deferred Estimated date of resolution  "Stabilize my medications" 06/25/12     "A few days away from drinking so I can think and come up with some ideas about rehab" 06/25/12           depression 06/25/12     Substance abuse 06/25/12     Suicidal ideation 06/25/12                        DISCHARGE CRITERIA:  Ability to meet basic life and health needs Adequate post-discharge living arrangements Improved stabilization in mood, thinking, and/or behavior Medical problems require only outpatient monitoring Motivation to continue treatment in a less acute level of care Need for constant or close observation no longer present Reduction of life-threatening or endangering symptoms to within safe limits Safe-care adequate arrangements made Verbal commitment to aftercare and medication compliance Withdrawal symptoms are absent or subacute and managed without 24-hour nursing intervention  PRELIMINARY DISCHARGE PLAN: Attend 12-step recovery group Outpatient therapy Placement in alternative living arrangements  PATIENT/FAMIILY INVOLVEMENT: This treatment plan has been presented to and reviewed with the patient, Edward [name], and/or family member.  The patient and family have been given the opportunity to ask questions and make suggestions.  Edward [name] 06/25/2012, 1:52 AM

## 2012-06-25 NOTE — Progress Notes (Signed)
Recreation Therapy Notes  Date: 03.04.2014 Time: 3:00pm Location: [facility]      Group Topic/Focus: Coping Skills  Participation Level: Did not attend  [employer], LRT/CTRS         Jearl Klinefelter 06/25/2012 3:48 PM

## 2012-06-25 NOTE — ED Notes (Signed)
Pt transferred to [facility].Vital signs stable and GCS 15

## 2012-06-26 MED ORDER — ARIPIPRAZOLE 10 MG PO TABS
20.0000 mg | ORAL_TABLET | Freq: Every day | ORAL | Status: DC
  Administered 2012-06-27 – 2012-07-02 (×6): 20 mg via ORAL
  Filled 2012-06-26 (×7): qty 2

## 2012-06-26 MED ORDER — GABAPENTIN 600 MG PO TABS
600.0000 mg | ORAL_TABLET | Freq: Every day | ORAL | Status: DC
  Administered 2012-06-26 – 2012-07-07 (×10): 600 mg via ORAL
  Filled 2012-06-26 (×14): qty 1

## 2012-06-26 NOTE — Tx Team (Signed)
Interdisciplinary Treatment Plan Update (Adult)  Date: 06/26/2012  Time Reviewed: 10:34 AM   Progress in Treatment: Attending groups: No Participating in groups: No Taking medication as prescribed: Yes Tolerating medication: Yes  Family/Significant othe contact made: No Patient understands diagnosis: Yes Discussing patient identified problems/goals with staff: Yes Medical problems stabilized or resolved:  Yes Denies suicidal/homicidal ideation: Off and On SI Patient has not harmed self or Others: Yes  New problem(s) identified: None Identified  Discharge Plan or Barriers:  CSW is assessing for appropriate referrals.   Additional comments: N/A  Reason for Continuation of Hospitalization: Depression Hallucinations - Auditory Medication stabilization Suicidal ideation Withdrawal symptoms   Estimated length of stay: 3-5 days  For review of initial/current patient goals, please see plan of care.   Attendees:  Patient:  06/26/2012 10:34 AM   Family:    Physician: Geoffery Lyons  06/26/2012 10:34 AM   Nursing: Robbie Louis, RN  06/26/2012 10:34 AM   Clinical Social Worker Ronda Fairly  06/26/2012 10:34 AM   Other: Norval Gable, NP Student  06/26/2012 10:34 AM   Other: Leland Johns Psych Intern  06/26/2012 10:34 AM   Other: Serena Colonel, NP  06/26/2012 10:34 AM   Other:  Roswell Miners, RN 06/26/2012 10:34 AM    Scribe for Treatment Team:   Carney Bern, LCSWA  06/26/2012 10:34 AM

## 2012-06-26 NOTE — Progress Notes (Signed)
[facility] LCSW Aftercare Discharge Planning Group Note  06/26/2012 11:00 AM Participation Quality:  Did Not Attend; patient reports nausea  [name] 06/26/2012, 6:34 PM

## 2012-06-26 NOTE — Progress Notes (Signed)
[facility] MD Progress Note  06/26/2012 1:43 PM Edward [name]  MRN:  [id number]  Subjective: "I feel sick to my stomach. Nauseated, dizzy and anxious. I feel every part of my body hurting and aching. The voices has not slowed down either. The voices are mocking me, telling me that I'm up to no good, and that God hates me. My mood feel horrible today. I'm feeling suicidal and homicidal. But I'm not planning to hurt anyone and or myself. I need something for the voices".  O: Explained to Edward [name] that he is currently on Abilify 15 mg daily.  Diagnosis:   Axis I: Post Traumatic Stress Disorder, Substance Induced Mood Disorder and Alcohol dependence, Cocaine abuse Axis II: Deferred Axis III:  Past Medical History  Diagnosis Date  . Thyroid disease   . Neuromuscular disorder     pinched nerve  . Hypothyroidism   . Mental disorder   . Depression   . Anxiety and depression   . Major depression   . PTSD (post-traumatic stress disorder)   . Hepatitis     hep C   Axis IV: economic problems, housing problems, occupational problems and other psychosocial or environmental problems Axis V: 11-20 some danger of hurting self or others possible OR occasionally fails to maintain minimal personal hygiene OR gross impairment in communication  ADL's:  Fair  Sleep: 6.5 hours per documentation  Appetite:  Fair  Suicidal Ideation: "Yes" Plan:  No Intent:  No Means:  No  Homicidal Ideation: "No" Plan:  No Intent:  No Means:  No AEB (as evidenced by):  Psychiatric Specialty Exam: Review of Systems  Constitutional: Negative.   HENT: Negative.   Eyes: Negative.   Respiratory: Negative.   Cardiovascular: Negative.   Gastrointestinal: Positive for nausea and abdominal pain.  Genitourinary: Negative.   Musculoskeletal: Positive for myalgias.  Skin: Negative for itching and rash.       Has numerous tattoos to arm and facial areas.  Neurological: Positive for tremors.  Endo/Heme/Allergies: Negative.    Psychiatric/Behavioral: Positive for depression, suicidal ideas, hallucinations and substance abuse. The patient is nervous/anxious.     Blood pressure 128/90, pulse 71, temperature 98.1 F (36.7 C), temperature source Oral, resp. rate 20, height 5' 7.5" (1.715 m), weight 83.462 kg (184 lb).Body mass index is 28.38 kg/(m^2).  General Appearance: Casual  Eye Contact::  Minimal  Speech:  Clear and Coherent  Volume:  Normal  Mood:  Depressed and rated depression at #8  Affect:  Constricted  Thought Process:  Coherent and Intact  Orientation:  Full (Time, Place, and Person)  Thought Content:  Hallucinations: Auditory and Rumination, "Voices telling that I'm up to no good, that God hates me"  Suicidal Thoughts:  Yes.  without intent/plan  Homicidal Thoughts:  Yes.  without intent/plan  Memory:  Immediate;   Good Recent;   Good Remote;   Good  Judgement:  Impaired  Insight:  Shallow  Psychomotor Activity:  Restlessness  Concentration:  Poor  Recall:  Good  Akathisia:  No  Handed:  Right  AIMS (if indicated):     Assets:  Desire for Improvement  Sleep:  Number of Hours: 6.5   Current Medications: Current Facility-Administered Medications  Medication Dose Route Frequency Edward [name] Last Rate Last Dose  . alum & mag hydroxide-simeth (MAALOX/MYLANTA) 200-200-20 MG/5ML suspension 30 mL  30 mL Oral Q4H PRN Edward Hough, PA-C      . ARIPiprazole (ABILIFY) tablet 15 mg  15 mg Oral Daily Edward Hampshire  E Simon, PA-C   15 mg at 06/26/12 0953  . diphenhydrAMINE (BENADRYL) capsule 50 mg  50 mg Oral QHS,MR X 1 Edward E Simon, PA-C      . gabapentin (NEURONTIN) tablet 600 mg  600 mg Oral TID Edward Hough, PA-C   600 mg at 06/26/12 1159  . hydrOXYzine (ATARAX/VISTARIL) tablet 50 mg  50 mg Oral Daily PRN Edward Hough, PA-C   50 mg at 06/26/12 1000  . ibuprofen (ADVIL,MOTRIN) tablet 800 mg  800 mg Oral TID Edward Hough, PA-C   800 mg at 06/26/12 1159  . levothyroxine (SYNTHROID, LEVOTHROID)  tablet 100 mcg  100 mcg Oral Daily Edward Hough, PA-C   100 mcg at 06/26/12 4098  . loperamide (IMODIUM) capsule 2-4 mg  2-4 mg Oral PRN Edward Hough, PA-C   4 mg at 06/25/12 2122  . magnesium hydroxide (MILK OF MAGNESIA) suspension 30 mL  30 mL Oral Daily PRN Edward Hough, PA-C      . mirtazapine (REMERON) tablet 15 mg  15 mg Oral QHS Edward Hough, PA-C   15 mg at 06/25/12 2122  . multivitamin with minerals tablet 1 tablet  1 tablet Oral Daily Edward Hough, PA-C   1 tablet at 06/26/12 1191  . nicotine (NICODERM CQ - dosed in mg/24 hours) patch 14 mg  14 mg Transdermal Q0600 Edward Hough, PA-C   14 mg at 06/26/12 4782  . ondansetron (ZOFRAN-ODT) disintegrating tablet 4 mg  4 mg Oral Q6H PRN Edward Hough, PA-C   4 mg at 06/26/12 9562  . thiamine (B-1) injection 100 mg  100 mg Intramuscular Once [employer], PA-C      . thiamine (VITAMIN B-1) tablet 100 mg  100 mg Oral Daily Edward Hough, PA-C   100 mg at 06/26/12 0954  . Vilazodone HCl (VIIBRYD) TABS 20 mg  20 mg Oral Daily Edward Fee, MD   20 mg at 06/26/12 1308    Lab Results:  Results for orders placed during the hospital encounter of 06/24/12 (from the past 48 hour(s))  URINE RAPID DRUG SCREEN (HOSP PERFORMED)     Status: Abnormal   Collection Time    06/24/12  1:55 PM      Result Value Range   Opiates NONE DETECTED  NONE DETECTED   Cocaine POSITIVE (*) NONE DETECTED   Benzodiazepines NONE DETECTED  NONE DETECTED   Amphetamines NONE DETECTED  NONE DETECTED   Tetrahydrocannabinol POSITIVE (*) NONE DETECTED   Barbiturates NONE DETECTED  NONE DETECTED   Comment:            DRUG SCREEN FOR MEDICAL PURPOSES     ONLY.  IF CONFIRMATION IS NEEDED     FOR ANY PURPOSE, NOTIFY LAB     WITHIN 5 DAYS.                LOWEST DETECTABLE LIMITS     FOR URINE DRUG SCREEN     Drug Class       Cutoff (ng/mL)     Amphetamine      1000     Barbiturate      200     Benzodiazepine   200     Tricyclics       300      Opiates          300     Cocaine          300  THC              50    Physical Findings: AIMS: Facial and Oral Movements Muscles of Facial Expression: None, normal Lips and Perioral Area: None, normal Jaw: None, normal Tongue: None, normal,Extremity Movements Upper (arms, wrists, hands, fingers): None, normal Lower (legs, knees, ankles, toes): None, normal, Trunk Movements Neck, shoulders, hips: None, normal, Overall Severity Severity of abnormal movements (highest score from questions above): None, normal Incapacitation due to abnormal movements: None, normal Patient's awareness of abnormal movements (rate only patient's report): No Awareness,    CIWA:  CIWA-Ar Total: 0 COWS:     Treatment Plan Summary: Daily contact with patient to assess and evaluate symptoms and progress in treatment Medication management  Plan: Supportive approach/coping skills/relapse prevention. Encouraged out of room, participation in group sessions and application of coping skills when distressed. Will continue to monitor response to/adverse effects of medications in use to assure effectiveness. Continue to monitor mood, behavior and interaction with staff and other patients. Continue current plan of care.  Medical Decision Making Problem Points:  Established problem, stable/improving (1), Review of last therapy session (1) and Review of psycho-social stressors (1) Data Points:  Review of medication regiment & side effects (2) Review of new medications or change in dosage (2)  I certify that inpatient services furnished can reasonably be expected to improve the patient's condition.   Armandina Stammer I, FNP-BC, PMHNP-BC 06/26/2012, 1:43 PM

## 2012-06-26 NOTE — Progress Notes (Signed)
Patient ID: Edward [name], male   DOB: 02-27-1961, 52 y.o.   MRN: [id number] He has been in bed most of day up for medication this PM.and he went to supper. He c/o nausea and indigestion today at this time he said he is feeling better.

## 2012-06-26 NOTE — Progress Notes (Signed)
Patient ID: Edward [name], male   DOB: Jan 18, 1961, 52 y.o.   MRN: [id number]  D:  Pt still does not engage esily in conversation with the Clinical research associate. When he does speak, it's in a low tone and rapid. Pt did request immodium for diarrhea and vistaril for anxiety.  A:  Support and encouragement was offered. 15 min checks continued for safety.  R: Pt remains safe.

## 2012-06-26 NOTE — Progress Notes (Signed)
[facility] LCSW Group Therapy  06/26/2012  Type of Therapy:  Group Therapy  Participation Level:  Did Not Attend  [name] 06/26/2012, 7:09 PM

## 2012-06-26 NOTE — Progress Notes (Signed)
[facility] LCSW Group Therapy  06/25/2012 1:15 to 2:30 PM   Type of Therapy:  Group Therapy  Participation Level: Did not attend    [name]

## 2012-06-26 NOTE — Progress Notes (Signed)
Discharge Planning Group  06/25/2012 8:45 AM  Participation Quality: Did Not Attend   [name]

## 2012-06-27 MED ORDER — VILAZODONE HCL 10 MG PO TABS
40.0000 mg | ORAL_TABLET | Freq: Every day | ORAL | Status: DC
  Administered 2012-06-28 – 2012-07-08 (×9): 40 mg via ORAL
  Filled 2012-06-27 (×13): qty 4

## 2012-06-27 MED ORDER — MIRTAZAPINE 30 MG PO TABS
30.0000 mg | ORAL_TABLET | Freq: Every day | ORAL | Status: DC
  Administered 2012-06-27 – 2012-07-07 (×10): 30 mg via ORAL
  Filled 2012-06-27 (×2): qty 1
  Filled 2012-06-27: qty 3
  Filled 2012-06-27 (×10): qty 1

## 2012-06-27 NOTE — Progress Notes (Signed)
[facility] MD Progress Note  06/27/2012 2:21 PM Edward [name]  MRN:  [id number] Subjective:  Still experiencing active symptoms. He is trying to get his life back together. He still endorses suicidal ideas  and the persistent voice of a woman. He at least is hopeful that the Viibryd will work for him. He has not experience a lot of relief with other antidepressants or mood stabilizers. He did tolerate the increase in Abilify well. He is trying not to get [place] as he admits he knows what is going to happen. Aware of the status of his liver and the need to abstain.  Diagnosis:  Alcohol Dependence, Major Depression, Cocaine abuse, PTSD  ADL's:  Intact  Sleep: Fair  Appetite:  Fair  Suicidal Ideation:  Plan:  denies Intent:  denies Means:  denies Homicidal Ideation:  Plan:  denies Intent:  denies Means:  denies AEB (as evidenced by):  Psychiatric Specialty Exam: Review of Systems  Constitutional: Negative.   HENT: Negative.   Eyes: Negative.   Respiratory: Negative.   Cardiovascular: Negative.   Gastrointestinal: Negative.   Genitourinary: Negative.   Musculoskeletal: Negative.   Skin: Negative.   Neurological: Negative.   Endo/Heme/Allergies: Negative.   Psychiatric/Behavioral: Positive for depression, suicidal ideas, hallucinations and substance abuse. The patient is nervous/anxious and has insomnia.     Blood pressure 98/65, pulse 77, temperature 97.6 F (36.4 C), temperature source Oral, resp. rate 16, height 5' 7.5" (1.715 m), weight 83.462 kg (184 lb).Body mass index is 28.38 kg/(m^2).  General Appearance: Fairly Groomed  Patent attorney::  Fair  Speech:  Clear and Coherent, Slow and not spontaneous  Volume:  Decreased  Mood:  Anxious, Depressed and worried  Affect:  Restricted  Thought Process:  Coherent and Goal Directed  Orientation:  Full (Time, Place, and Person)  Thought Content:  worries, concerns  Suicidal Thoughts:  Yes.  without intent/plan  Homicidal  Thoughts:  No  Memory:  Immediate;   Fair Recent;   Fair Remote;   Fair  Judgement:  Poor  Insight:  Present  Psychomotor Activity:  Restlessness  Concentration:  Fair  Recall:  Fair  Akathisia:  No  Handed:  Right  AIMS (if indicated):     Assets:  Desire for Improvement  Sleep:  Number of Hours: 6   Current Medications: Current Facility-Administered Medications  Medication Dose Route Frequency Saturnino Liew Last Rate Last Dose  . alum & mag hydroxide-simeth (MAALOX/MYLANTA) 200-200-20 MG/5ML suspension 30 mL  30 mL Oral Q4H PRN Kerry Hough, PA-C   30 mL at 06/26/12 1426  . ARIPiprazole (ABILIFY) tablet 20 mg  20 mg Oral Daily Rachael Fee, MD   20 mg at 06/27/12 0840  . diphenhydrAMINE (BENADRYL) capsule 50 mg  50 mg Oral QHS,MR X 1 Spencer E Simon, PA-C      . gabapentin (NEURONTIN) tablet 600 mg  600 mg Oral TID Rachael Fee, MD   600 mg at 06/27/12 1217  . gabapentin (NEURONTIN) tablet 600 mg  600 mg Oral QHS Rachael Fee, MD   600 mg at 06/26/12 2237  . hydrOXYzine (ATARAX/VISTARIL) tablet 50 mg  50 mg Oral Daily PRN Kerry Hough, PA-C   50 mg at 06/26/12 2239  . ibuprofen (ADVIL,MOTRIN) tablet 800 mg  800 mg Oral TID Kerry Hough, PA-C   800 mg at 06/27/12 1217  . levothyroxine (SYNTHROID, LEVOTHROID) tablet 100 mcg  100 mcg Oral Daily Kerry Hough, PA-C  100 mcg at 06/27/12 0840  . loperamide (IMODIUM) capsule 2-4 mg  2-4 mg Oral PRN Kerry Hough, PA-C   4 mg at 06/25/12 2122  . magnesium hydroxide (MILK OF MAGNESIA) suspension 30 mL  30 mL Oral Daily PRN Kerry Hough, PA-C      . mirtazapine (REMERON) tablet 30 mg  30 mg Oral QHS Rachael Fee, MD      . multivitamin with minerals tablet 1 tablet  1 tablet Oral Daily Kerry Hough, PA-C   1 tablet at 06/27/12 0840  . nicotine (NICODERM CQ - dosed in mg/24 hours) patch 14 mg  14 mg Transdermal Q0600 Kerry Hough, PA-C   14 mg at 06/27/12 0845  . ondansetron (ZOFRAN-ODT) disintegrating tablet 4 mg  4 mg  Oral Q6H PRN Kerry Hough, PA-C   4 mg at 06/26/12 1610  . thiamine (B-1) injection 100 mg  100 mg Intramuscular Once [employer], PA-C      . thiamine (VITAMIN B-1) tablet 100 mg  100 mg Oral Daily Kerry Hough, PA-C   100 mg at 06/27/12 0840  . [START ON 06/28/2012] Vilazodone HCl (VIIBRYD) TABS 40 mg  40 mg Oral Daily Rachael Fee, MD        Lab Results: No results found for this or any previous visit (from the past 48 hour(s)).  Physical Findings: AIMS: Facial and Oral Movements Muscles of Facial Expression: None, normal Lips and Perioral Area: None, normal Jaw: None, normal Tongue: None, normal,Extremity Movements Upper (arms, wrists, hands, fingers): None, normal Lower (legs, knees, ankles, toes): None, normal, Trunk Movements Neck, shoulders, hips: None, normal, Overall Severity Severity of abnormal movements (highest score from questions above): None, normal Incapacitation due to abnormal movements: None, normal Patient's awareness of abnormal movements (rate only patient's report): No Awareness,    CIWA:  CIWA-Ar Total: 4 COWS:     Treatment Plan Summary: Daily contact with patient to assess and evaluate symptoms and progress in treatment Medication management  Plan: Supportive approach/coping skills/relapse prevention           Increase the Viibryd to 40 mg daily           Consider further increase in the Abilify  Medical Decision Making Problem Points:  Review of last therapy session (1) and Review of psycho-social stressors (1) Data Points:  Review of medication regiment & side effects (2) Review of new medications or change in dosage (2)  I certify that inpatient services furnished can reasonably be expected to improve the patient's condition.   LUGO,IRVING A 06/27/2012, 2:21 PM

## 2012-06-27 NOTE — Progress Notes (Signed)
D. Pt has been up and visible in milieu this evening, did attend evening group session. Pt has stated that he is still having suicidal thoughts and feeling hopeless. Pt did speak about wanting to go to treatment program at [facility] and not wanting to go back to [facility]. Pt has received all medications today without incident. A. Support and encouragement provided. R. Will continue to monitor.

## 2012-06-27 NOTE — Progress Notes (Signed)
[facility] LCSW Group Therapy  06/27/2012   Type of Therapy:  Group Therapy from 1:15 to 2:30 PM  Participation Level:  Appropriate  Participation Quality: Appropriate  Affect:  Appropriate  Cognitive:  Alert and Oriented     Insight: Developing, Improving  Engagement in Therapy: Engaged   Modes of Intervention: Activity,  Exploration and support  Summary of Progress/Problems: Focus of group processing discussion was on balance in life; the components in life which have a negative influence on balance and the components which make for a more balanced life.  Patient shared that having something to ground him like a job or home would create balance for him.   Edward [name]

## 2012-06-27 NOTE — Progress Notes (Signed)
Patient ID: Edward [name], male   DOB: June 24, 1960, 52 y.o.   MRN: [id number]   D: Pt engaged the writer in conversation easier today than previous days. Pt discussed his feelings regarding AA. Stated that he's heard people say that AA that doesn't work. Pt stated "AA is wonderful when you apply yourself". Stated he didn't go to many of the groups during the day but did enjoy his AA group this evening.   A:  Support and encouragement was offered. 15 min checks continued for safety.  R: Pt remains safe.

## 2012-06-27 NOTE — Progress Notes (Signed)
Patient ID: Edward [name], male   DOB: December 03, 1960, 52 y.o.   MRN: [id number] Resending referral to [facility] as they report it was not received on 06/26/12. Patient concerns about social security benefits addressed.  Patient's response to discussion regarding a back up plan is to [place]." Carney Bern, LCSWA

## 2012-06-27 NOTE — Progress Notes (Signed)
[facility] LCSW Aftercare Discharge Planning Group Note  06/27/2012   Participation Quality: Appropriate  Affect: Appropriate   Cognitive: Appropriate  Insight: Developing, Improving   Engagement in Group: Engaged  Modes of Intervention:  Clarification, Exploration, and Support  Summary of Progress/Problems:  Pt denies homicidal ideation. Reports continuing nagging thoughts of suicide; agrees to contract for safety, last attempt was in 2012. On a scale of 1 to 10 with ten being the most ever experienced, the patient rates depression at a 4 and anxiety at a 6. Patient requested we speak again one to one as he is now willing to look at a plan B should [facility] not work out.     Edward [name]

## 2012-06-27 NOTE — Progress Notes (Signed)
Patient ID: Edward [name], male   DOB: [birth year], 52 y.o.   MRN: [id number] He has been up and to groups stated that he is feeling better today. Continues to have rt knee pain and has been given pain medications. Self inventory: depression 6, hopelessness 6, W/D agitation, positive SI thoughts (contracts for safety).

## 2012-06-27 NOTE — Progress Notes (Signed)
Adult Psychosocial Assessment Update Interdisciplinary Team  Previous [facility] admissions/discharges:  Admissions Discharges  Date: 12.1.13 Date: 12.18.13  Date:  8.5.13 Date:  8.9.13  Date: Date:  Date: Date:  Date: Date:   Changes since the last Psychosocial Assessment (including adherence to outpatient mental health and/or substance abuse treatment, situational issues contributing to decompensation and/or relapse).  Patient reports he discharged to [facility] and stayed there the allowed  67 days.  Relapsed on alcohol, and crack cocaine on Feb 1.  Drinks two to three 40   Ounce beers per day, smokes cocaine intermittently when funds permit. Last use   Of cocaine consisted of $700 in 2 days. [place] [facility]        Discharge Plan 1. Will you be returning to the same living situation after discharge?   Yes: No: X     If no, what is your plan?    Wants to [place].       2. Would you like a referral for services when you are discharged? Yes: X    If yes, for what services?  No:       Wants referral to treatment center; [facility] is 1st choice as he cannot return to [facility]  And [facility] will not accept his Medicaid as out of county [employer] and Recommendations (to be completed by the evaluator)  Patient is 52 YO caucasian male admitted with diagnosis of [place] substance   Dependence and Substance induced mood disorder.  Patient's third [facility] admission in 8  Months in addition to multiple  . Patient will benefit from crisis stabilization, medication  Evaluation, group therapy and psycho education in addition to case management for   discharge planning.                Signature:  [name], 06/27/2012  12

## 2012-06-27 NOTE — ED Provider Notes (Signed)
Medical screening examination/treatment/procedure(s) were performed by non-physician practitioner and as supervising physician I was immediately available for consultation/collaboration.   Chantelle Verdi H Lya Holben, MD 06/27/12 0900 

## 2012-06-27 NOTE — Progress Notes (Signed)
Adult Psychoeducational Group Note  Date:  06/27/2012 Time:  4:06 PM  Group Topic/Focus:  Overcoming Stress:   The focus of this group is to define stress and help patients assess their triggers.  Participation Level:  Active  Participation Quality:  Appropriate, Attentive, Sharing and Supportive  Affect:  Appropriate  Cognitive:  Alert and Oriented  Insight: Appropriate and Good  Engagement in Group:  Engaged and Supportive  Modes of Intervention:  Education and Support  Additional Comments:  Pt actively participated in group discussion and small group "stress interview." Pt identified that the primary stressor in his life is homelessness. Pt identified that it is difficult for him to refrain from substance abuse when surrounded by others who are drinking/drugging. Pt identified that he can cope with this stress by "networking" with others and changing the people, places, and things in his life associated with substance abuse.    Reinaldo Raddle K 06/27/2012, 4:06 PM

## 2012-06-28 MED ORDER — ONDANSETRON 4 MG PO TBDP
4.0000 mg | ORAL_TABLET | Freq: Three times a day (TID) | ORAL | Status: DC | PRN
  Administered 2012-06-28 – 2012-07-08 (×5): 4 mg via ORAL
  Filled 2012-06-28: qty 1

## 2012-06-28 MED ORDER — NAPROXEN 500 MG PO TABS
500.0000 mg | ORAL_TABLET | Freq: Two times a day (BID) | ORAL | Status: DC
  Administered 2012-06-28 – 2012-07-08 (×18): 500 mg via ORAL
  Filled 2012-06-28 (×5): qty 1
  Filled 2012-06-28: qty 6
  Filled 2012-06-28: qty 1
  Filled 2012-06-28: qty 6
  Filled 2012-06-28 (×17): qty 1

## 2012-06-28 NOTE — Progress Notes (Signed)
Adult Psychoeducational Group Note  Date:  06/28/2012 Time:  10:39 PM  Group Topic/Focus:  AA group  Participation Level:  Active  Participation Quality:  Appropriate  Affect:  Appropriate  Cognitive:  Alert  Insight: Appropriate  Engagement in Group:  Improving  Modes of Intervention:  Discussion  Additional Comments:    Flonnie Hailstone 06/28/2012, 10:39 PM

## 2012-06-28 NOTE — Progress Notes (Signed)
[facility] LCSW Group Therapy  06/28/2012   Type of Therapy:  Group Therapy at 1:15 to 2:30   Participation Level:  Active  Participation Quality:  Monopolizing  Affect:  Appropriate  Cognitive:   Appropriate  Insight: Engaged  Engagement in Therapy:  Engaged  Modes of Intervention:  Education, Environmental education officer of Progress/Problems: Group session included an educational portion on Post Acute Withdrawal Syndrome (PAWS).  Patients were able to process the information and share feelings related to length of time recovery takes.  Edward [name] commented on multiple points discussed to the point that limits needed to be reinforced twice.      [name] 06/28/2012, 8:02 PM

## 2012-06-28 NOTE — Progress Notes (Signed)
[facility] MD Progress Note  06/28/2012 4:43 PM Edward [name]  MRN:  [id number] Subjective:  Edward [name] continues to have a hard time. He continues to experience pain, what increases his depression as well as his PTSD symptoms. He endorses he is still having dreams, nightmares, a sense of unreality, having to be guarded, hyperalert.  He also experiences the male voice. He admits that once he got to isolate, avoid AA, got into relapse mode he has not been able to get himself together. [place]. Diagnosis:  PTSD, Major Depression, Alcohol Dependence  ADL's:  Intact  Sleep: Poor  Appetite:  Fair  Suicidal Ideation: Denies  Homicidal Ideation:  Plan:  denies Intent:  denies Means:  denies AEB (as evidenced by):  Psychiatric Specialty Exam: Review of Systems  Constitutional: Negative.   HENT: Negative.   Eyes: Negative.   Respiratory: Negative.   Cardiovascular: Negative.   Gastrointestinal: Negative.   Genitourinary: Negative.   Musculoskeletal: Positive for back pain and joint pain.  Skin: Negative.   Neurological: Negative.   Endo/Heme/Allergies: Negative.   Psychiatric/Behavioral: Positive for depression and substance abuse. The patient is nervous/anxious and has insomnia.     Blood pressure 124/87, pulse 81, temperature 97.3 F (36.3 C), temperature source Oral, resp. rate 20, height 5' 7.5" (1.715 m), weight 83.462 kg (184 lb).Body mass index is 28.38 kg/(m^2).  General Appearance: Fairly Groomed  Patent attorney::  Fair  Speech:  Clear and Coherent and Slow  Volume:  Decreased  Mood:  Anxious, Depressed and worried  Affect:  Restricted  Thought Process:  Coherent and Goal Directed  Orientation:  Full (Time, Place, and Person)  Thought Content:  Rumination and worries, concerns  Suicidal Thoughts:  Yes.  without intent/plan  Homicidal Thoughts:  No  Memory:  Immediate;   Fair Recent;   Fair Remote;   Fair  Judgement:  Fair  Insight:  Present   Psychomotor Activity:  Restlessness  Concentration:  Fair  Recall:  Fair  Akathisia:  No  Handed:  Right  AIMS (if indicated):     Assets:  Desire for Improvement  Sleep:  Number of Hours: 5   Current Medications: Current Facility-Administered Medications  Medication Dose Route Frequency Provider Last Rate Last Dose  . alum & mag hydroxide-simeth (MAALOX/MYLANTA) 200-200-20 MG/5ML suspension 30 mL  30 mL Oral Q4H PRN Kerry Hough, PA-C   30 mL at 06/26/12 1426  . ARIPiprazole (ABILIFY) tablet 20 mg  20 mg Oral Daily Rachael Fee, MD   20 mg at 06/28/12 0810  . diphenhydrAMINE (BENADRYL) capsule 50 mg  50 mg Oral QHS,MR X 1 Spencer E Simon, PA-C      . gabapentin (NEURONTIN) tablet 600 mg  600 mg Oral TID Rachael Fee, MD   600 mg at 06/28/12 1154  . gabapentin (NEURONTIN) tablet 600 mg  600 mg Oral QHS Rachael Fee, MD   600 mg at 06/27/12 2142  . hydrOXYzine (ATARAX/VISTARIL) tablet 50 mg  50 mg Oral Daily PRN Kerry Hough, PA-C   50 mg at 06/27/12 2142  . levothyroxine (SYNTHROID, LEVOTHROID) tablet 100 mcg  100 mcg Oral Daily Kerry Hough, PA-C   100 mcg at 06/28/12 0809  . magnesium hydroxide (MILK OF MAGNESIA) suspension 30 mL  30 mL Oral Daily PRN Kerry Hough, PA-C      . mirtazapine (REMERON) tablet 30 mg  30 mg Oral QHS Rachael Fee, MD  30 mg at 06/27/12 2142  . multivitamin with minerals tablet 1 tablet  1 tablet Oral Daily Kerry Hough, PA-C   1 tablet at 06/28/12 0810  . naproxen (NAPROSYN) tablet 500 mg  500 mg Oral BID WC Rachael Fee, MD      . nicotine (NICODERM CQ - dosed in mg/24 hours) patch 14 mg  14 mg Transdermal Q0600 Kerry Hough, PA-C   14 mg at 06/28/12 0549  . ondansetron (ZOFRAN-ODT) disintegrating tablet 4 mg  4 mg Oral Q8H PRN Rachael Fee, MD   4 mg at 06/28/12 1504  . thiamine (B-1) injection 100 mg  100 mg Intramuscular Once [employer], PA-C      . thiamine (VITAMIN B-1) tablet 100 mg  100 mg Oral Daily Kerry Hough, PA-C    100 mg at 06/28/12 0809  . Vilazodone HCl (VIIBRYD) TABS 40 mg  40 mg Oral Daily Rachael Fee, MD   40 mg at 06/28/12 2956    Lab Results: No results found for this or any previous visit (from the past 48 hour(s)).  Physical Findings: AIMS: Facial and Oral Movements Muscles of Facial Expression: None, normal Lips and Perioral Area: None, normal Jaw: None, normal Tongue: None, normal,Extremity Movements Upper (arms, wrists, hands, fingers): None, normal Lower (legs, knees, ankles, toes): None, normal, Trunk Movements Neck, shoulders, hips: None, normal, Overall Severity Severity of abnormal movements (highest score from questions above): None, normal Incapacitation due to abnormal movements: None, normal Patient's awareness of abnormal movements (rate only patient's report): No Awareness,    CIWA:  CIWA-Ar Total: 1 COWS:     Treatment Plan Summary: Daily contact with patient to assess and evaluate symptoms and progress in treatment Medication management  Plan: Supportive approach/coping skills/relapse prevention           Will switch to Naprosyn what he thinks worked better in the past           Will continue the Abilify at 20 mg but consider increasing it further           PT consult for appropriateness for Knee brace Medical Decision Making Problem Points:  Review of last therapy session (1) and Review of psycho-social stressors (1) Data Points:  Review of medication regiment & side effects (2)  I certify that inpatient services furnished can reasonably be expected to improve the patient's condition.   LUGO,IRVING A 06/28/2012, 4:43 PM

## 2012-06-28 NOTE — Progress Notes (Signed)
D: Patient appropriate and cooperative with staff. Patient's affect/mood is blunted and depressed. Patient complained of nausea. He reported on the self inventory sheet that his sleep is fair, appetite is improving, energy level is low and ability to pay attention is good. Patient rated depression and feelings of hopelessness "7".  A: Support and encouragement provided to patient. Administered scheduled medications per ordering MD. PRN Zofran given for nausea. Monitor Q15 minute checks for safety.   R: Patient receptive. Passive SI and AH, but contracts for safety. Patient verbalized that the voices tell him "you're not worth nothing". Denies HI and VH. Patient remains safe.

## 2012-06-28 NOTE — Progress Notes (Signed)
Patient did attend the evening karaoke group. Pt was engaged, supportive, and participated by singing multiple songs.   

## 2012-06-29 NOTE — Progress Notes (Signed)
Patient ID: Edward [name], male   DOB: 04/02/1961, 52 y.o.   MRN: [id number] D: Patient mood/affect is sad and depressed. Cooperative with assessment. Pt. reports sleep was "poor ". Appetite is "improving ". Pt rates depression  was "10" and hopelessness was "7" out of 10 scale. Pt  Stated on self inventory sheet to "go to meetings" after discharge. Pt denies SI/HI.  A: medications administered as prescribed. Safety has been maintained with Q15 minutes observation. Supported and encouragement provided to attend groups.   R: Patient remains safe. He is complaint with medication. Safety has been maintained Q15 and continue current POC.

## 2012-06-29 NOTE — Progress Notes (Signed)
Patient ID: Edward [name], male   DOB: November 26, 1960, 52 y.o.   MRN: [id number] [facility] MD Progress Note  06/29/2012 11:58 AM Duey Liller  MRN:  [id number] Subjective:      He continues to experience pain, what increases his depression as well as his PTSD symptoms. Thinks knee pain affecting him a lot right now.   He also experiences the male voice. Thinks knee brace can help him a lot right now as he cant walk much. Diagnosis:  PTSD, Major Depression, Alcohol Dependence  ADL's:  Intact  Sleep: Poor  Appetite:  Fair  Suicidal Ideation: Denies  Homicidal Ideation:  Plan:  denies Intent:  denies Means:  denies AEB (as evidenced by):  Psychiatric Specialty Exam: Review of Systems  Constitutional: Negative.   HENT: Negative.   Eyes: Negative.   Respiratory: Negative.   Cardiovascular: Negative.   Gastrointestinal: Negative.   Genitourinary: Negative.   Musculoskeletal: Positive for back pain and joint pain.  Skin: Negative.   Neurological: Negative.   Endo/Heme/Allergies: Negative.   Psychiatric/Behavioral: Positive for depression and substance abuse. The patient is nervous/anxious and has insomnia.     Blood pressure 120/83, pulse 73, temperature 98.7 F (37.1 C), temperature source Oral, resp. rate 19, height 5' 7.5" (1.715 m), weight 83.462 kg (184 lb).Body mass index is 28.38 kg/(m^2).  General Appearance: Fairly Groomed  Patent attorney::  Fair  Speech:  Clear and Coherent and Slow  Volume:  Decreased  Mood:  sad  Affect:  Restricted  Thought Process:  Coherent and Goal Directed  Orientation:  Full (Time, Place, and Person)  Thought Content:  Rumination and worries, concerns  Suicidal Thoughts:  Yes.  without intent/plan  Homicidal Thoughts:  No  Memory:  Immediate;   Fair Recent;   Fair Remote;   Fair  Judgement:  Fair  Insight:  Present  Psychomotor Activity:  Restlessness  Concentration:  Fair  Recall:  Fair  Akathisia:  No  Handed:  Right  AIMS (if indicated):      Assets:  Desire for Improvement  Sleep:  Number of Hours: 6.25   Current Medications: Current Facility-Administered Medications  Medication Dose Route Frequency [place] Last Rate Last Dose  . alum & mag hydroxide-simeth (MAALOX/MYLANTA) 200-200-20 MG/5ML suspension 30 mL  30 mL Oral Q4H PRN Kerry Hough, PA-C   30 mL at 06/26/12 1426  . ARIPiprazole (ABILIFY) tablet 20 mg  20 mg Oral Daily Rachael Fee, MD   20 mg at 06/29/12 [phone]  . diphenhydrAMINE (BENADRYL) capsule 50 mg  50 mg Oral QHS,MR X 1 Kerry Hough, PA-C   50 mg at 06/28/12 2157  . gabapentin (NEURONTIN) tablet 600 mg  600 mg Oral TID Rachael Fee, MD   600 mg at 06/29/12 0839  . gabapentin (NEURONTIN) tablet 600 mg  600 mg Oral QHS Rachael Fee, MD   600 mg at 06/28/12 2157  . hydrOXYzine (ATARAX/VISTARIL) tablet 50 mg  50 mg Oral Daily PRN Kerry Hough, PA-C   50 mg at 06/27/12 2142  . levothyroxine (SYNTHROID, LEVOTHROID) tablet 100 mcg  100 mcg Oral Daily Kerry Hough, PA-C   100 mcg at 06/29/12 0840  . magnesium hydroxide (MILK OF MAGNESIA) suspension 30 mL  30 mL Oral Daily PRN Kerry Hough, PA-C      . mirtazapine (REMERON) tablet 30 mg  30 mg Oral QHS Rachael Fee, MD   30 mg at 06/28/12 2156  . multivitamin with minerals tablet  1 tablet  1 tablet Oral Daily Kerry Hough, PA-C   1 tablet at 06/29/12 0840  . naproxen (NAPROSYN) tablet 500 mg  500 mg Oral BID WC Rachael Fee, MD   500 mg at 06/29/12 0840  . nicotine (NICODERM CQ - dosed in mg/24 hours) patch 14 mg  14 mg Transdermal Q0600 Kerry Hough, PA-C   14 mg at 06/29/12 0836  . ondansetron (ZOFRAN-ODT) disintegrating tablet 4 mg  4 mg Oral Q8H PRN Rachael Fee, MD   4 mg at 06/28/12 1504  . thiamine (B-1) injection 100 mg  100 mg Intramuscular Once [employer], PA-C      . thiamine (VITAMIN B-1) tablet 100 mg  100 mg Oral Daily Kerry Hough, PA-C   100 mg at 06/29/12 0841  . Vilazodone HCl (VIIBRYD) TABS 40 mg  40 mg Oral Daily Rachael Fee, MD   40 mg at 06/29/12 1610    Lab Results: No results found for this or any previous visit (from the past 48 hour(s)).  Physical Findings: AIMS: Facial and Oral Movements Muscles of Facial Expression: None, normal Lips and Perioral Area: None, normal Jaw: None, normal Tongue: None, normal,Extremity Movements Upper (arms, wrists, hands, fingers): None, normal Lower (legs, knees, ankles, toes): None, normal, Trunk Movements Neck, shoulders, hips: None, normal, Overall Severity Severity of abnormal movements (highest score from questions above): None, normal Incapacitation due to abnormal movements: None, normal Patient's awareness of abnormal movements (rate only patient's report): No Awareness,    CIWA:  CIWA-Ar Total: 0 COWS:     Treatment Plan Summary: Daily contact with patient to assess and evaluate symptoms and progress in treatment Medication management  Plan: Supportive approach/coping skills/relapse prevention           Will continue current meds           PT consult for appropriateness for Knee brace pending. Pt was told about it today Medical Decision Making Problem Points:  Review of last therapy session (1) and Review of psycho-social stressors (1) Data Points:  Review of medication regiment & side effects (2)  I certify that inpatient services furnished can reasonably be expected to improve the patient's condition.   Wonda Cerise 06/29/2012, 11:58 AM

## 2012-06-29 NOTE — Progress Notes (Signed)
Patient ID: Edward [name], male   DOB: 22-Sep-1960, 52 y.o.   MRN: [id number] D. The patient has a flat affect and depressed mood. Stated that he is concerned with housing after discharge. [place] of all of the temptations to relapse. He stated that he is hoping to go to the shelter in [facility]. A. Verbal support and encouragement given. Encouraged to attend evening A.A. Group. R. No signs or symptoms of withdrawal noted. Attended and participated in evening group. Compliant with medication.

## 2012-06-29 NOTE — Progress Notes (Signed)
[facility] Group Notes:  (Nursing/MHT/Case Management/Adjunct)  Date:  06/29/2012  Time:  09:15AM  Type of Therapy:  Psychoeducational Skills  Participation Level:  Minimal   Participation Quality:  Inattentive and Resistant  Affect:  Depressed, Flat and Resistant  Cognitive:  Alert  Insight:  Lacking  Engagement in Group:  Defensive, Limited and Resistant  Modes of Intervention:  Clarification, Discussion and Education  Summary of Progress/Problems: Pt. did not attend group until more than half over.  Pt.'s discussions show interest in finding housing only.  Pixie Casino [place] 06/29/2012, 5:27 PM

## 2012-06-29 NOTE — Progress Notes (Signed)
Patient ID: Edward [name], male   DOB: [birth year], 52 y.o.   MRN: [id number] D. The patient  stated that he is having a better day. He spoke to his old girlfriend and he thinks she is ready to make the commitment of sobriety. He knows he can't have a relationship with her again until she makes that commitment. He is hoping to get into a 28 day inpatient rehab after discharge.  A. Met with patient 1:1. Support and encouragement given. Assessed for suicidal ideation. Reviewed discharge plans. R. The patient stated that he still has thoughts of suicide, but does not believe he would enact upon them. He is able to verbally contract for safety.

## 2012-06-30 MED ORDER — ZOLPIDEM TARTRATE 5 MG PO TABS
5.0000 mg | ORAL_TABLET | Freq: Every evening | ORAL | Status: DC | PRN
  Administered 2012-06-30 – 2012-07-02 (×3): 5 mg via ORAL
  Filled 2012-06-30 (×3): qty 1

## 2012-06-30 NOTE — Progress Notes (Signed)
Adult Psychoeducational Group Note  Date:  06/30/2012 Time:  9:28 PM  Group Topic/Focus:  Wrap-Up Group:   The focus of this group is to help patients review their daily goal of treatment and discuss progress on daily workbooks.  Participation Level:  Active  Participation Quality:  Drowsy  Affect:  Appropriate  Cognitive:  Appropriate  Insight: Good  Engagement in Group:  Engaged  Modes of Intervention:  Discussion  Additional Comments:  Patient shared that his headache from earlier today went away without medication.  Capitola Ladson, Newton Pigg 06/30/2012, 9:28 PM

## 2012-06-30 NOTE — Clinical Social Work Note (Signed)
[facility] Group Notes: (Clinical Social Work)   06/30/2012      Type of Therapy:  Group Therapy   Participation Level:  Did Not Attend    Ambrose Mantle, LCSW 06/30/2012, 11:08 AM

## 2012-06-30 NOTE — Progress Notes (Signed)
D:  Patient up and active in the milieu.  States he has had some periods of dizziness throughout the day.   A:  Patient was encouraged to change positions slow and to ask for help if feeling unsteady.  He was also given Gatorade and encouraged to push fluids.  R:  Pleasant and cooperative.  Interacting well with staff and peers.  Safety is maintained.

## 2012-06-30 NOTE — Progress Notes (Signed)
Patient ID: Edward [name], male   DOB: 1960/09/05, 52 y.o.   MRN: [id number] [facility] MD Progress Note  06/30/2012 11:16 AM Edward [name]  MRN:  [id number] Subjective:     Per pt PT could not see him so far. continues to experience pain, what increases his depression. Thinks knee pain affecting him a lot right now.   Reports poor sleep now. Per trazodone cause him to wake at night and does want to take it now.  Diagnosis:  PTSD, Major Depression, Alcohol Dependence  ADL's:  Intact  Sleep: Poor  Appetite:  Fair  Suicidal Ideation: Denies  Homicidal Ideation:  Plan:  denies Intent:  denies Means:  denies AEB (as evidenced by):  Psychiatric Specialty Exam: Review of Systems  Constitutional: Negative.   HENT: Negative.   Eyes: Negative.   Respiratory: Negative.   Cardiovascular: Negative.   Gastrointestinal: Negative.   Genitourinary: Negative.   Musculoskeletal: Positive for back pain and joint pain.  Skin: Negative.   Neurological: Negative.   Endo/Heme/Allergies: Negative.   Psychiatric/Behavioral: Positive for depression and substance abuse. The patient is nervous/anxious and has insomnia.     Blood pressure 113/71, pulse 71, temperature 97.6 F (36.4 C), temperature source Oral, resp. rate 14, height 5' 7.5" (1.715 m), weight 83.462 kg (184 lb).Body mass index is 28.38 kg/(m^2).  General Appearance: Fairly Groomed  Patent attorney::  Fair  Speech:  Clear and Coherent and Slow  Volume:  Decreased  Mood:  sad  Affect:  Restricted  Thought Process:  Coherent and Goal Directed  Orientation:  Full (Time, Place, and Person)  Thought Content:  Rumination and worries, concerns  Suicidal Thoughts:  Yes.  without intent/plan  Homicidal Thoughts:  No  Memory:  Immediate;   Fair Recent;   Fair Remote;   Fair  Judgement:  Fair  Insight:  Present  Psychomotor Activity:  Restlessness  Concentration:  Fair  Recall:  Fair  Akathisia:  No  Handed:  Right  AIMS (if indicated):      Assets:  Desire for Improvement  Sleep:  Number of Hours: 5.5   Current Medications: Current Facility-Administered Medications  Medication Dose Route Frequency Provider Last Rate Last Dose  . alum & mag hydroxide-simeth (MAALOX/MYLANTA) 200-200-20 MG/5ML suspension 30 mL  30 mL Oral Q4H PRN Kerry Hough, PA-C   30 mL at 06/29/12 1310  . ARIPiprazole (ABILIFY) tablet 20 mg  20 mg Oral Daily Rachael Fee, MD   20 mg at 06/30/12 0818  . diphenhydrAMINE (BENADRYL) capsule 50 mg  50 mg Oral QHS,MR X 1 Kerry Hough, PA-C   50 mg at 06/28/12 2157  . gabapentin (NEURONTIN) tablet 600 mg  600 mg Oral TID Rachael Fee, MD   600 mg at 06/30/12 0819  . gabapentin (NEURONTIN) tablet 600 mg  600 mg Oral QHS Rachael Fee, MD   600 mg at 06/29/12 2135  . hydrOXYzine (ATARAX/VISTARIL) tablet 50 mg  50 mg Oral Daily PRN Kerry Hough, PA-C   50 mg at 06/29/12 2135  . levothyroxine (SYNTHROID, LEVOTHROID) tablet 100 mcg  100 mcg Oral Daily Kerry Hough, PA-C   100 mcg at 06/30/12 4010  . magnesium hydroxide (MILK OF MAGNESIA) suspension 30 mL  30 mL Oral Daily PRN Kerry Hough, PA-C      . mirtazapine (REMERON) tablet 30 mg  30 mg Oral QHS Rachael Fee, MD   30 mg at 06/29/12 2134  . multivitamin with minerals tablet  1 tablet  1 tablet Oral Daily Kerry Hough, PA-C   1 tablet at 06/29/12 0840  . naproxen (NAPROSYN) tablet 500 mg  500 mg Oral BID WC Rachael Fee, MD   500 mg at 06/30/12 0818  . nicotine (NICODERM CQ - dosed in mg/24 hours) patch 14 mg  14 mg Transdermal Q0600 Kerry Hough, PA-C   14 mg at 06/30/12 0818  . ondansetron (ZOFRAN-ODT) disintegrating tablet 4 mg  4 mg Oral Q8H PRN Rachael Fee, MD   4 mg at 06/28/12 1504  . thiamine (B-1) injection 100 mg  100 mg Intramuscular Once [employer], PA-C      . thiamine (VITAMIN B-1) tablet 100 mg  100 mg Oral Daily Kerry Hough, PA-C   100 mg at 06/30/12 0819  . Vilazodone HCl (VIIBRYD) TABS 40 mg  40 mg Oral Daily Rachael Fee, MD   40 mg at 06/30/12 1610    Lab Results: No results found for this or any previous visit (from the past 48 hour(s)).  Physical Findings: AIMS: Facial and Oral Movements Muscles of Facial Expression: None, normal Lips and Perioral Area: None, normal Jaw: None, normal Tongue: None, normal,Extremity Movements Upper (arms, wrists, hands, fingers): None, normal Lower (legs, knees, ankles, toes): None, normal, Trunk Movements Neck, shoulders, hips: None, normal, Overall Severity Severity of abnormal movements (highest score from questions above): None, normal Incapacitation due to abnormal movements: None, normal Patient's awareness of abnormal movements (rate only patient's report): No Awareness,    CIWA:  CIWA-Ar Total: 0 COWS:     Treatment Plan Summary: Daily contact with patient to assess and evaluate symptoms and progress in treatment Medication management  Plan: Supportive approach/coping skills/relapse prevention           Will add ambien for sleep now           PT consult for appropriateness for Knee brace pending. Pt was told about it today Medical Decision Making Problem Points:  Review of last therapy session (1) and Review of psycho-social stressors (1) Data Points:  Review of medication regiment & side effects (2)  I certify that inpatient services furnished can reasonably be expected to improve the patient's condition.   Wonda Cerise 06/30/2012, 11:16 AM

## 2012-07-01 MED ORDER — HALOPERIDOL 2 MG PO TABS
2.0000 mg | ORAL_TABLET | Freq: Two times a day (BID) | ORAL | Status: DC
  Administered 2012-07-01 – 2012-07-02 (×2): 2 mg via ORAL
  Filled 2012-07-01: qty 2
  Filled 2012-07-01: qty 1
  Filled 2012-07-01: qty 2
  Filled 2012-07-01 (×3): qty 1

## 2012-07-01 MED ORDER — BENZTROPINE MESYLATE 1 MG PO TABS: 1.0000 mg | ORAL_TABLET | Freq: Two times a day (BID) | ORAL | Status: DC | PRN

## 2012-07-01 NOTE — Tx Team (Signed)
Interdisciplinary Treatment Plan Update (Adult)   Date: 07/01/2012   Time Reviewed: 9:51 AM   Progress in Treatment:  Attending groups: Yes, yet not so much over weekend Participating in groups: Yes, when he attends Taking medication as prescribed: Yes Tolerating medication: Yes Family/Significant othe contact made: No, no consent  Patient understands diagnosis: Yes Discussing patient identified problems/goals with staff: Yes Medical problems stabilized or resolved: No  Denies suicidal/homicidal ideation: Yes  Patient has not harmed self or Others: Yes   New problem(s) identified: None Identified   Discharge Plan or Barriers: CSW has referred patient to [facility] and has explored shelter option with [facility] with patient. Patient is [facility] to [facility].   Additional comments: N/A   Reason for Continuation of Hospitalization:  Anxiety  Depression  Medication stabilization; changing medicatios Suicidal ideation  PT Consultation   Estimated length of stay: 3-5 days   For review of initial/current patient goals, please see plan of care.  Attendees:  Patient:    Family:    Physician: Geoffery Lyons  07/01/2012 9:51 AM   Nursing:  07/01/2012 9:51 AM   Clinical Social Worker Ronda Fairly  07/01/2012 9:51 AM   Other: Liliane Bade, Transiitional Care Coordinator  07/01/2012 9:51 AM   Other: Robbie Louis, RN  07/01/2012 9:51 AM   Other: Concha Se, [employer] PA Student 07/01/2012 9:51 AM   Other: Leland Johns Psych Intern 07/01/2012 9:51 AM    Scribe for Treatment Team:  Carney Bern, LCSWA 07/01/2012 9:51 AM

## 2012-07-01 NOTE — Progress Notes (Signed)
Recreation Therapy Notes  Date: 03.10.2014 Time: 3:00pm Location: [facility]  Group Topic/Focus: Stress Managment   Participation Level:  Did not attend    [employer], LRT/ CTRS    [place], Denise L 07/01/2012 4:01 PM

## 2012-07-01 NOTE — Progress Notes (Signed)
AA members did not arrive so patients watched an AA video. Pt attended and was attentive.  

## 2012-07-01 NOTE — Progress Notes (Signed)
Patient ID: Edward [name], male   DOB: 1960-05-11, 52 y.o.   MRN: [id number] D)  Has been out in the dayroom this evening, interacting appropriately with staff and select peers.  Attended group, has been pleasant, cooperative, med compliant.  Voices no c/o's at this time, feels is doing fairly well with detox, but states has always been depressed, even as a child, but wasn't sure why.  Came to med window for hs meds, stated h/a is gone.   A)  Will continue to monitor for safety, cont.POC R)  Safety maintained.

## 2012-07-01 NOTE — Evaluation (Signed)
Physical Therapy Evaluation Patient Details Name: Edward [name] MRN: [id number] DOB: 1961/02/15 Today's Date: 07/01/2012 Time: 6440-3474 PT Time Calculation (min): 30 min  PT Assessment / Plan / Recommendation Clinical Impression  52 yo male admitted with alcohol dependence to [facility] Pt c/o R knee instability, pain. Has hx of R medial meniscus tear back in 2013. Pt has had ongoing issues with knee (hyperextension/instability, pain, and falls) . Pt states MD did not feel surgery was warranted and  that he needed to "stay off of it". However, pt is homeless and he ambulates long distances daily. Discussed possible need for KI to improve R knee stability (during ambulation only-pt should not wear all the time) and use of cane as well. Discussed proper technique for cane use. Issued exercise program for R LE for pt to perform as tolerated. Recommended follow up with ortho MD, soon, as outpatient once able. Recommended pt perform exercises initially once a day, 10 reps (as tolerated-to discontinue any exercises that were too painful). Can progress sessions/reps as tolerated.  1x eval. Spoke with RN and requested MD order for KI.     PT Assessment  Patent does not need any further PT services    Follow Up Recommendations  No PT follow up    Does the patient have the potential to tolerate intense rehabilitation      Barriers to Discharge        Equipment Recommendations  Knee Immobilizer for R LE; Cane ( for ambulation-pt states he already has access to cane)    Recommendations for Other Services     Frequency      Precautions / Restrictions Precautions Precautions: Fall Restrictions Weight Bearing Restrictions: No   Pertinent Vitals/Pain 10/10 before and after session      Mobility  Bed Mobility Bed Mobility: Supine to Sit;Sit to Supine Supine to Sit: 6: Modified independent (Device/Increase time) Sit to Supine: 6: Modified independent (Device/Increase time) Transfers Transfers: Sit  to Stand;Stand to Sit Sit to Stand: 6: Modified independent (Device/Increase time) Stand to Sit: 6: Modified independent (Device/Increase time) Details for Transfer Assistance: Increased time to rise, stabilize Ambulation/Gait Ambulation/Gait Assistance: 5: Supervision Ambulation Distance (Feet): 100 Feet Assistive device: None Ambulation/Gait Assistance Details: Pt occasionally reaching out for handrail to hold onto. Slow gait speed.  Gait Pattern: Antalgic;Decreased stance time - right;Decreased weight shift to right;Decreased stride length;Step-to pattern;Step-through pattern    Exercises General Exercises - Lower Extremity Long Arc Quad: AROM;Right;10 reps;Seated Hip ABduction/ADduction: AROM;10 reps;Sidelying;Standing;Right Straight Leg Raises: AROM;Right;10 reps;Supine Other Exercises Other Exercises: hip extension, standing, 10 reps R leg; knee flexion, standing, 10 reps R leg Other Exercises: supine hamstring stretch with sheet, 3 times, 15 second hold   PT Diagnosis:    PT Problem List:   PT Treatment Interventions:     PT Goals    Visit Information  Last PT Received On: 07/01/12 Assistance Needed: +1    Subjective Data  Subjective: I cant stay off of it. I have to be able to get around. Im homeless Patient Stated Goal: rehab   Prior Functioning  Home Living Home Adaptive Equipment: Straight cane;Other (comment) (knee brace) Additional Comments: pt is homeless. lives in tent Prior Function Level of Independence: Independent with assistive device(s) Communication Communication: No difficulties    Cognition  Cognition Overall Cognitive Status: Appears within functional limits for tasks assessed/performed Arousal/Alertness: Awake/alert Orientation Level: Appears intact for tasks assessed Behavior During Session: [facility] for tasks performed    Extremity/Trunk Assessment Right Lower Extremity Assessment  RLE ROM/Strength/Tone: Deficits RLE ROM/Strength/Tone  Deficits: ROM/strength limited by pain. Knee ext 3+/5, knee flex 3+/5, hip abd 3+/5 Left Lower Extremity Assessment LLE ROM/Strength/Tone: WFL for tasks assessed   Balance    End of Session PT - End of Session Activity Tolerance: Patient limited by pain Patient left:  (ambulating in hallway) Nurse Communication: Other (comment) (need for md to write order for KI for R LE)  GP Functional Assessment Tool Used: clinical judgement Functional Limitation: Mobility: Walking and moving around Mobility: Walking and Moving Around Current Status ([id number]): At least 1 percent but less than 20 percent impaired, limited or restricted Mobility: Walking and Moving Around Goal Status [phone]): At least 1 percent but less than 20 percent impaired, limited or restricted Mobility: Walking and Moving Around Discharge Status [phone]): At least 1 percent but less than 20 percent impaired, limited or restricted   Rebeca Alert, MPT Pager: [phone]

## 2012-07-01 NOTE — Progress Notes (Signed)
[facility] LCSW Aftercare Discharge Planning Group Note  07/01/2012   Participation Quality:  Did Not Attend   Edward [name]

## 2012-07-01 NOTE — Plan of Care (Signed)
Problem: Alteration in mood Goal: LTG-Pt's behavior demonstrates decreased signs of depression (Patient's behavior demonstrates decreased signs of depression to the point the patient is safe to return home and continue treatment in an outpatient setting)  Outcome: Not Met (add Reason) Patient has apparently been in room and in bed for much of weekend; agreed to participate in groups today  Problem: Alteration in mood & ability to function due to Goal: LTG-Patient demonstrates decreased signs of withdrawal (Patient demonstrates decreased signs of withdrawal to the point the patient is safe to return home and continue treatment in an outpatient setting)  Outcome: Progressing Patient has decreased signs of physical withdrawal yet still dealing with emotional withdrawal; agrees to participate in groups today  Problem: Ineffective individual coping Goal: STG: Patient will participate in after care plan Outcome: Progressing Patient advocates for 28 day treatment program yet many are closed to him as he has used services in past; remains on wait list for [facility] yet also willingly discusses back up plan of [facility] in [facility] along with attendance at [employer]

## 2012-07-01 NOTE — Progress Notes (Signed)
[name] MD Progress Note  07/01/2012 5:48 PM Edward [name]  MRN:  [id number] Subjective:  [facility] endorses feeling depressed 10/10, anxious 10/10. Also endorses suicidal ideas. Sates that he does not know exactly what happened in the last 24 hours. Feels his mood has gotten worst. Feels frustrated with the voices he hears in his head. Wants them " to stop." He does not feel that increasing the Abilify further will help. He used Haldol in the past successfully. States that he had EPS with the Haldol, but that he is willing to give it a try again with cogentin to get the voices under control Diagnosis:  Alcohol Dependence, Cocaine Abuse, Major Depression with psychotic features, PTSD  ADL's:  Intact  Sleep: Poor  Appetite:  Fair  Suicidal Ideation:  Plan:  Denies Intent:  denies Means:  Denies Homicidal Ideation:  Plan:  Denies Intent:  denies Means:  denies AEB (as evidenced by):  Psychiatric Specialty Exam: Review of Systems  Constitutional: Negative.   HENT: Negative.   Eyes: Negative.   Respiratory: Negative.   Cardiovascular: Negative.   Gastrointestinal: Negative.   Genitourinary: Negative.   Musculoskeletal: Negative.   Skin: Negative.   Neurological: Negative.   Psychiatric/Behavioral: Positive for depression, suicidal ideas, hallucinations and substance abuse. The patient is nervous/anxious and has insomnia.     Blood pressure 128/87, pulse 88, temperature 98.2 F (36.8 C), temperature source Oral, resp. rate 18, height 5' 7.5" (1.715 m), weight 83.462 kg (184 lb).Body mass index is 28.38 kg/(m^2).  General Appearance: Fairly Groomed  Patent attorney::  Minimal  Speech:  Clear and Coherent, Slow and not spontaneous  Volume:  Decreased  Mood:  Anxious, Depressed and Irritable  Affect:  Restricted  Thought Process:  Coherent and Goal Directed  Orientation:  Full (Time, Place, and Person)  Thought Content:  frustration with the voices  Suicidal Thoughts:  Yes.  without  intent/plan  Homicidal Thoughts:  No  Memory:  Immediate;   Fair  Judgement:  Intact  Insight:  Present  Psychomotor Activity:  Restlessness  Concentration:  Fair  Recall:  Fair  Akathisia:  No  Handed:  Right  AIMS (if indicated):     Assets:  Desire for Improvement  Sleep:  Number of Hours: 5.5   Current Medications: Current Facility-Administered Medications  Medication Dose Route Frequency Provider Last Rate Last Dose  . alum & mag hydroxide-simeth (MAALOX/MYLANTA) 200-200-20 MG/5ML suspension 30 mL  30 mL Oral Q4H PRN Kerry Hough, PA-C   30 mL at 06/29/12 1310  . ARIPiprazole (ABILIFY) tablet 20 mg  20 mg Oral Daily Rachael Fee, MD   20 mg at 07/01/12 0836  . benztropine (COGENTIN) tablet 1 mg  1 mg Oral BID PRN Rachael Fee, MD      . diphenhydrAMINE (BENADRYL) capsule 50 mg  50 mg Oral QHS,MR X 1 Kerry Hough, PA-C   50 mg at 06/28/12 2157  . gabapentin (NEURONTIN) tablet 600 mg  600 mg Oral TID Rachael Fee, MD   600 mg at 07/01/12 1732  . gabapentin (NEURONTIN) tablet 600 mg  600 mg Oral QHS Rachael Fee, MD   600 mg at 06/30/12 2127  . haloperidol (HALDOL) tablet 2 mg  2 mg Oral BID Rachael Fee, MD   2 mg at 07/01/12 1732  . hydrOXYzine (ATARAX/VISTARIL) tablet 50 mg  50 mg Oral Daily PRN Kerry Hough, PA-C   50 mg at 06/29/12 2135  . levothyroxine (SYNTHROID,  LEVOTHROID) tablet 100 mcg  100 mcg Oral Daily Kerry Hough, PA-C   100 mcg at 07/01/12 0545  . magnesium hydroxide (MILK OF MAGNESIA) suspension 30 mL  30 mL Oral Daily PRN Kerry Hough, PA-C      . mirtazapine (REMERON) tablet 30 mg  30 mg Oral QHS Rachael Fee, MD   30 mg at 06/30/12 2126  . multivitamin with minerals tablet 1 tablet  1 tablet Oral Daily Kerry Hough, PA-C   1 tablet at 07/01/12 4540  . naproxen (NAPROSYN) tablet 500 mg  500 mg Oral BID WC Rachael Fee, MD   500 mg at 07/01/12 1732  . nicotine (NICODERM CQ - dosed in mg/24 hours) patch 14 mg  14 mg Transdermal Q0600 Kerry Hough, PA-C   14 mg at 07/01/12 0554  . ondansetron (ZOFRAN-ODT) disintegrating tablet 4 mg  4 mg Oral Q8H PRN Rachael Fee, MD   4 mg at 07/01/12 1350  . thiamine (B-1) injection 100 mg  100 mg Intramuscular Once [employer], PA-C      . thiamine (VITAMIN B-1) tablet 100 mg  100 mg Oral Daily Kerry Hough, PA-C   100 mg at 07/01/12 0836  . Vilazodone HCl (VIIBRYD) TABS 40 mg  40 mg Oral Daily Rachael Fee, MD   40 mg at 07/01/12 0836  . zolpidem (AMBIEN) tablet 5 mg  5 mg Oral QHS PRN Wonda Cerise, MD   5 mg at 06/30/12 2126    Lab Results: No results found for this or any previous visit (from the past 48 hour(s)).  Physical Findings: AIMS: Facial and Oral Movements Muscles of Facial Expression: None, normal Lips and Perioral Area: None, normal Jaw: None, normal Tongue: None, normal,Extremity Movements Upper (arms, wrists, hands, fingers): None, normal Lower (legs, knees, ankles, toes): None, normal, Trunk Movements Neck, shoulders, hips: None, normal, Overall Severity Severity of abnormal movements (highest score from questions above): None, normal Incapacitation due to abnormal movements: None, normal Patient's awareness of abnormal movements (rate only patient's report): No Awareness,    CIWA:  CIWA-Ar Total: 7 COWS:     Treatment Plan Summary: Daily contact with patient to assess and evaluate symptoms and progress in treatment Medication management  Plan: Supportive approach/coping skills/relapse prevention           Trial with Haldol 2 mg BID/Cogentin 1 mg BID PRN EPS              Medical Decision Making Problem Points:  Review of psycho-social stressors (1) Data Points:  Review of medication regiment & side effects (2) Review of new medications or change in dosage (2)  I certify that inpatient services furnished can reasonably be expected to improve the patient's condition.   LUGO,IRVING A 07/01/2012, 5:48 PM

## 2012-07-02 MED ORDER — ARIPIPRAZOLE 10 MG PO TABS
10.0000 mg | ORAL_TABLET | Freq: Every day | ORAL | Status: DC
  Filled 2012-07-02 (×2): qty 1

## 2012-07-02 MED ORDER — HALOPERIDOL 5 MG PO TABS
5.0000 mg | ORAL_TABLET | Freq: Every day | ORAL | Status: DC
  Administered 2012-07-02: 5 mg via ORAL
  Filled 2012-07-02 (×2): qty 1

## 2012-07-02 NOTE — Progress Notes (Signed)
[facility] LCSW Aftercare Discharge Planning Group Note  07/02/2012  8:45 AM  Participation Quality:  Did Not Attend   [name] 07/02/2012, 2:07 PM

## 2012-07-02 NOTE — Progress Notes (Signed)
Pt attended AA group; participated by sharing; was attentive and supportive of peers.

## 2012-07-02 NOTE — Progress Notes (Signed)
[facility] MD Progress Note  07/02/2012 12:10 PM Davontay Watlington  MRN:  [id number] Subjective:  Edward [name] endorses not feeling right. Endorses a fogy feeling in his head, his vision is "dark" and has lightheadedness. He feel is the anxiety and not side effects from the medications. He still feels depressed, anxious. Still endorses the presence of the voices. The voices he states get to him. He does not see the Abilify making any difference for him. He would rather increase the Haldol and see if it helps with the voices. He still endorses suicidal ideas. States he does not feel things are going to get any better for him Diagnosis:  Major Depression with psychosis, PTSD, Mood Disorder NOS  ADL's:  Intact  Sleep: Fair  Appetite:  Fair  Suicidal Ideation:  Plan:  ideas, no plans Intent:  denies Means:  denies Homicidal Ideation:  Plan:  denies Intent:  denies Means:  denies AEB (as evidenced by):  Psychiatric Specialty Exam: Review of Systems  Constitutional: Negative.   Eyes: Negative.   Respiratory: Negative.   Gastrointestinal: Negative.   Genitourinary: Negative.   Musculoskeletal: Positive for back pain.  Skin: Negative.   Neurological: Positive for dizziness.       "cloudy, foggy"  Endo/Heme/Allergies: Negative.   Psychiatric/Behavioral: Positive for depression, suicidal ideas and substance abuse. The patient is nervous/anxious.     Blood pressure 107/72, pulse 81, temperature 97.4 F (36.3 C), temperature source Oral, resp. rate 16, height 5' 7.5" (1.715 m), weight 82.781 kg (182 lb 8 oz).Body mass index is 28.15 kg/(m^2).  General Appearance: Fairly Groomed  Patent attorney::  Minimal  Speech:  Clear and Coherent, Slow and not spontaneous  Volume:  Decreased  Mood:  Anxious, Depressed, Hopeless and Worthless  Affect:  Restricted  Thought Process:  Coherent and Goal Directed  Orientation:  Full (Time, Place, and Person)  Thought Content:  Rumination  Suicidal Thoughts:  Yes.  without  intent/plan  Homicidal Thoughts:  No  Memory:  Immediate;   Fair Recent;   Fair Remote;   Fair  Judgement:  Fair  Insight:  Present  Psychomotor Activity:  Restlessness  Concentration:  Fair  Recall:  Fair  Akathisia:  No  Handed:  Right  AIMS (if indicated):     Assets:  Desire for Improvement  Sleep:  Number of Hours: 6   Current Medications: Current Facility-Administered Medications  Medication Dose Route Frequency Provider Last Rate Last Dose  . alum & mag hydroxide-simeth (MAALOX/MYLANTA) 200-200-20 MG/5ML suspension 30 mL  30 mL Oral Q4H PRN Kerry Hough, PA-C   30 mL at 06/29/12 1310  . [START ON 07/03/2012] ARIPiprazole (ABILIFY) tablet 10 mg  10 mg Oral Daily Rachael Fee, MD      . benztropine (COGENTIN) tablet 1 mg  1 mg Oral BID PRN Rachael Fee, MD      . gabapentin (NEURONTIN) tablet 600 mg  600 mg Oral TID Rachael Fee, MD   600 mg at 07/02/12 1206  . gabapentin (NEURONTIN) tablet 600 mg  600 mg Oral QHS Rachael Fee, MD   600 mg at 07/01/12 2143  . haloperidol (HALDOL) tablet 5 mg  5 mg Oral QHS Rachael Fee, MD      . hydrOXYzine (ATARAX/VISTARIL) tablet 50 mg  50 mg Oral Daily PRN Kerry Hough, PA-C   50 mg at 06/29/12 2135  . levothyroxine (SYNTHROID, LEVOTHROID) tablet 100 mcg  100 mcg Oral Daily Kerry Hough, PA-C  100 mcg at 07/02/12 0620  . magnesium hydroxide (MILK OF MAGNESIA) suspension 30 mL  30 mL Oral Daily PRN Kerry Hough, PA-C      . mirtazapine (REMERON) tablet 30 mg  30 mg Oral QHS Rachael Fee, MD   30 mg at 07/01/12 2143  . multivitamin with minerals tablet 1 tablet  1 tablet Oral Daily Kerry Hough, PA-C   1 tablet at 07/02/12 1610  . naproxen (NAPROSYN) tablet 500 mg  500 mg Oral BID WC Rachael Fee, MD   500 mg at 07/02/12 0817  . nicotine (NICODERM CQ - dosed in mg/24 hours) patch 14 mg  14 mg Transdermal Q0600 Kerry Hough, PA-C   14 mg at 07/02/12 9604  . ondansetron (ZOFRAN-ODT) disintegrating tablet 4 mg  4 mg Oral  Q8H PRN Rachael Fee, MD   4 mg at 07/01/12 1350  . thiamine (B-1) injection 100 mg  100 mg Intramuscular Once [employer], PA-C      . thiamine (VITAMIN B-1) tablet 100 mg  100 mg Oral Daily Kerry Hough, PA-C   100 mg at 07/02/12 0818  . Vilazodone HCl (VIIBRYD) TABS 40 mg  40 mg Oral Daily Rachael Fee, MD   40 mg at 07/02/12 0817  . zolpidem (AMBIEN) tablet 5 mg  5 mg Oral QHS PRN Wonda Cerise, MD   5 mg at 07/01/12 2143    Lab Results: No results found for this or any previous visit (from the past 48 hour(s)).  Physical Findings: AIMS: Facial and Oral Movements Muscles of Facial Expression: None, normal Lips and Perioral Area: None, normal Jaw: None, normal Tongue: None, normal,Extremity Movements Upper (arms, wrists, hands, fingers): None, normal Lower (legs, knees, ankles, toes): None, normal, Trunk Movements Neck, shoulders, hips: None, normal, Overall Severity Severity of abnormal movements (highest score from questions above): None, normal Incapacitation due to abnormal movements: None, normal Patient's awareness of abnormal movements (rate only patient's report): No Awareness,    CIWA:  CIWA-Ar Total: 7 COWS:     Treatment Plan Summary: Daily contact with patient to assess and evaluate symptoms and progress in treatment Medication management  Plan: Supportive approach/coping skills/relapse prevention           Decrease the Abilify to 10 mg and D/C           Increase Haldol to 5 mg HS and reassess effectiveness  Medical Decision Making Problem Points:  Review of psycho-social stressors (1) Data Points:  Review of medication regiment & side effects (2) Review of new medications or change in dosage (2)  I certify that inpatient services furnished can reasonably be expected to improve the patient's condition.   LUGO,IRVING A 07/02/2012, 12:10 PM

## 2012-07-02 NOTE — Progress Notes (Signed)
Pt has been in bed most of the day.  He rated his depression a 6 hopelessness a 5 and his anxiety a 5 on his self-inventory.  He still has suicidal ideations but no plans here and contracts for safety to come to staff.  He is still unsure of his discharge plan and he really will not discuss this when this nurse tries to talk with him about his plans when he leaves here.  He did say "I can go to [facility] if I have to"

## 2012-07-02 NOTE — Progress Notes (Signed)
Recreation Therapy Notes  Date: 03.11.2014 Time:3:00pm Location: [facility]      Group Topic/Focus: Goal Setting  Participation Level: Active  Participation Quality: Sharing  Affect: Euthymic  Cognitive: Appropriate  Additional Comments: Patient filled out Coat of Arms. Patient identified the following components of his Coat of Arms: My best quality, Something I am good at, Something I want to try, An obstacle I have overcome, A relationship I would like to rebuild, Something I want to accomplish in the next year. Patient chose to share A relationship I would like to rebuild with the group. Patient stated "his daughter." Patient spoke about not seeing his daughter for several years. Patient stated his daughter will not answer his phone calls. Peer suggested to patient that patient send a letter to his daughter. Patient stated he does keep up with his daughter on FaceBook. Patient spoke about skydiving and about wanted to learn how to fly planes. Patient participate in group discussion about setting goals and how that can effect recovery.   Marykay Lex Blanchfield, LRT/CTRS   Jearl Klinefelter 07/02/2012 4:40 PM

## 2012-07-03 MED ORDER — ZOLPIDEM TARTRATE 10 MG PO TABS
10.0000 mg | ORAL_TABLET | Freq: Every evening | ORAL | Status: DC | PRN
  Administered 2012-07-04 – 2012-07-07 (×4): 10 mg via ORAL
  Filled 2012-07-03 (×4): qty 1

## 2012-07-03 MED ORDER — HALOPERIDOL 5 MG PO TABS
7.5000 mg | ORAL_TABLET | Freq: Every day | ORAL | Status: DC
  Filled 2012-07-03 (×3): qty 2

## 2012-07-03 NOTE — Progress Notes (Signed)
Psychoeducational Group Note  Date:  07/03/2012 Time:  2000  Group Topic/Focus:  AA group  Participation Level: Did Not Attend  Participation Quality:  Not Applicable  Affect:  Not Applicable  Cognitive:  Not Applicable  Insight:  Not Applicable  Engagement in Group: Not Applicable  Additional Comments:    Flonnie Hailstone 07/03/2012, 8:42 PM

## 2012-07-03 NOTE — Progress Notes (Signed)
Patient ID: Edward [name], male   DOB: 04/10/1961, 52 y.o.   MRN: [id number] Al of his 8am medications were offered to him 3 times and he continued to say "I don't want them". He has remained in bed all this AM , he did not want to get up. He was encouraged to take his medications and to attend groups.

## 2012-07-03 NOTE — Progress Notes (Signed)
Date: 07/03/2012  Time: 11;00am  Group Topic/Focus:  Personal Choices and Values: The focus of this group is to help patients assess and explore the importance of values in their lives, how their values affect their decisions, how they express their values and what opposes their expression.  Participation Level: Did Not Attend  Participation Quality:  Affect:  Cognitive:  Insight:  Engagement in Group:  Modes of Intervention:  Additional Comments: Pt refused to come. Pt has been in the bed all day.  [name], Edward M  07/03/2012, 1:05 PM  

## 2012-07-03 NOTE — Tx Team (Signed)
Interdisciplinary Treatment Plan Update (Adult)   Date: 07/03/2012   Time Reviewed: 9:51 AM   Progress in Treatment:  Attending groups: No, pt did not attend groups today Participating in groups: No Taking medication as prescribed: Yes Tolerating medication: Yes Family/Significant othe contact made: No, no consent  Patient understands diagnosis: Yes Discussing patient identified problems/goals with staff: Yes Medical problems stabilized or resolved: No  Denies suicidal/homicidal ideation: Yes  Patient has not harmed self or Others: Yes   New problem(s) identified: None Identified   Discharge Plan or Barriers: CSW has referred patient to [facility] and has explored shelter option with [facility] with patient. Patient is agreeable to [facility].   Additional comments: Contact made with [facility] by MSW Intern. Sent referral for follow up, awaiting response.  Reason for Continuation of Hospitalization:  Anxiety  Depression  Medication stabilization Suicidal ideation  PT Consultation   Estimated length of stay: 3-5 days   For review of initial/current patient goals, please see plan of care.  Attendees:  Patient:    Family:    Physician: Geoffery Lyons  07/03/2012 4:33 PM  Nursing:      07/03/2012 4:33 PM  Other: Foye Clock, MSW Intern 07/03/2012 4:33 PM  Other: Robbie Louis, RN  07/03/2012 4:33 PM  Other: Concha Se, [employer] PA Student 07/03/2012 4:33 PM  Other: Leland Johns Psych Intern 07/03/2012 4:33 PM   Scribe for Treatment Team:  Rico Ala 07/03/2012 4:36 PM

## 2012-07-03 NOTE — Progress Notes (Signed)
Patient ID: Edward [name], male   DOB: 1960/06/03, 52 y.o.   MRN: [id number]  D: Pt's affect still flat, however pt informed the writer that he's feeling better today than previous day. Pt informed the writer that "he's still hoping for a bed at [facility]". Pt voiced no other concerns or complaints.  A:  Support and encouragement was offered. 15 min checks continued for safety.  R: Pt remains safe.

## 2012-07-03 NOTE — Progress Notes (Signed)
[facility] MD Progress Note  07/03/2012 2:36 PM Edward [name]  MRN:  [id number] Subjective:  Edward [name] is in bed. Does not feel any better. Still hearing the voices. He did not sleep that well last night. He feels hopeless. Nothing he feels is working for him. He feels depressed. Diagnosis:  Major Depression with psychotic features, PTSD, Alcohol Dependence, Cocaine Abuse  ADL's:  Intact  Sleep: Poor  Appetite:  Fair  Suicidal Ideation: Ideas, no plans, will not act out on them while he is here  Homicidal Ideation:  Plan:  denies Intent:  denies Means:  denies AEB (as evidenced by):  Psychiatric Specialty Exam: Review of Systems  Constitutional: Negative.   HENT: Negative.   Eyes: Negative.   Respiratory: Negative.   Cardiovascular: Negative.   Gastrointestinal: Negative.   Genitourinary: Negative.   Musculoskeletal: Negative.   Skin: Negative.   Neurological: Negative.   Endo/Heme/Allergies: Negative.   Psychiatric/Behavioral: Positive for depression, suicidal ideas, hallucinations and substance abuse. The patient is nervous/anxious and has insomnia.     Blood pressure 126/85, pulse 85, temperature 98.3 F (36.8 C), temperature source Oral, resp. rate 18, height 5' 7.5" (1.715 m), weight 82.781 kg (182 lb 8 oz).Body mass index is 28.15 kg/(m^2).  General Appearance: Disheveled  Eye Contact::  Minimal  Speech:  Slow and not spontanous  Volume:  Decreased  Mood:  Depressed and Hopeless  Affect:  Restricted  Thought Process:  Coherent and Goal Directed  Orientation:  Full (Time, Place, and Person)  Thought Content:  Rumination  Suicidal Thoughts:  Yes.  without intent/plan  Homicidal Thoughts:  No  Memory:  Immediate;   Fair Recent;   Fair Remote;   Fair  Judgement:  Fair  Insight:  Shallow  Psychomotor Activity:  Restlessness  Concentration:  Fair  Recall:  Fair  Akathisia:  No  Handed:  Right  AIMS (if indicated):     Assets:  Desire for Improvement  Sleep:  Number of  Hours: 6.25   Current Medications: Current Facility-Administered Medications  Medication Dose Route Frequency Provider Last Rate Last Dose  . alum & mag hydroxide-simeth (MAALOX/MYLANTA) 200-200-20 MG/5ML suspension 30 mL  30 mL Oral Q4H PRN Kerry Hough, PA-C   30 mL at 06/29/12 1310  . benztropine (COGENTIN) tablet 1 mg  1 mg Oral BID PRN Rachael Fee, MD      . gabapentin (NEURONTIN) tablet 600 mg  600 mg Oral TID Rachael Fee, MD   600 mg at 07/02/12 1717  . gabapentin (NEURONTIN) tablet 600 mg  600 mg Oral QHS Rachael Fee, MD   600 mg at 07/02/12 2155  . haloperidol (HALDOL) tablet 7.5 mg  7.5 mg Oral QHS Rachael Fee, MD      . hydrOXYzine (ATARAX/VISTARIL) tablet 50 mg  50 mg Oral Daily PRN Kerry Hough, PA-C   50 mg at 07/03/12 0354  . levothyroxine (SYNTHROID, LEVOTHROID) tablet 100 mcg  100 mcg Oral Daily Kerry Hough, PA-C   100 mcg at 07/02/12 3086  . magnesium hydroxide (MILK OF MAGNESIA) suspension 30 mL  30 mL Oral Daily PRN Kerry Hough, PA-C      . mirtazapine (REMERON) tablet 30 mg  30 mg Oral QHS Rachael Fee, MD   30 mg at 07/02/12 2154  . multivitamin with minerals tablet 1 tablet  1 tablet Oral Daily Kerry Hough, PA-C   1 tablet at 07/02/12 0817  . naproxen (NAPROSYN) tablet 500 mg  500 mg Oral BID WC Rachael Fee, MD   500 mg at 07/02/12 1717  . nicotine (NICODERM CQ - dosed in mg/24 hours) patch 14 mg  14 mg Transdermal Q0600 Kerry Hough, PA-C   14 mg at 07/03/12 9629  . ondansetron (ZOFRAN-ODT) disintegrating tablet 4 mg  4 mg Oral Q8H PRN Rachael Fee, MD   4 mg at 07/01/12 1350  . thiamine (B-1) injection 100 mg  100 mg Intramuscular Once [employer], PA-C      . thiamine (VITAMIN B-1) tablet 100 mg  100 mg Oral Daily Kerry Hough, PA-C   100 mg at 07/02/12 0818  . Vilazodone HCl (VIIBRYD) TABS 40 mg  40 mg Oral Daily Rachael Fee, MD   40 mg at 07/02/12 0817  . zolpidem (AMBIEN) tablet 10 mg  10 mg Oral QHS PRN Rachael Fee, MD         Lab Results: No results found for this or any previous visit (from the past 48 hour(s)).  Physical Findings: AIMS: Facial and Oral Movements Muscles of Facial Expression: None, normal Lips and Perioral Area: None, normal Jaw: None, normal Tongue: None, normal,Extremity Movements Upper (arms, wrists, hands, fingers): None, normal Lower (legs, knees, ankles, toes): None, normal, Trunk Movements Neck, shoulders, hips: None, normal, Overall Severity Severity of abnormal movements (highest score from questions above): None, normal Incapacitation due to abnormal movements: None, normal Patient's awareness of abnormal movements (rate only patient's report): No Awareness,    CIWA:  CIWA-Ar Total: 7 COWS:     Treatment Plan Summary: Daily contact with patient to assess and evaluate symptoms and progress in treatment Medication management  Plan: Supportive approach/coping skills/relapse prevention           D/C Abilify           Increase the Haldol to 7.5 mg HS and then 10 mg              Medical Decision Making Problem Points:  Review of psycho-social stressors (1) Data Points:  Review of medication regiment & side effects (2) Review of new medications or change in dosage (2)  I certify that inpatient services furnished can reasonably be expected to improve the patient's condition.   LUGO,IRVING A 07/03/2012, 2:36 PM

## 2012-07-03 NOTE — Progress Notes (Signed)
[facility] LCSW Aftercare Discharge Planning Group Note  07/03/2012 10:16 AM  Participation Quality:  Pt did not attend    Edward [name] 07/03/2012, 10:16 AM

## 2012-07-03 NOTE — Progress Notes (Signed)
[facility] LCSW Group Therapy  07/03/2012 3:06 PM  Type of Therapy:  Group Therapy  Participation Level:  Did Not Attend   Bournes, Roshelle L 07/03/2012, 3:06 PM

## 2012-07-03 NOTE — Progress Notes (Signed)
D: Pt was laying in bed. Writer attempted to encourage pt to attend AA group. Pt informed the writer that he "just feels like giving up". Stated he's not taking "any more meds because they don't work". Stated he just wants to go back and "live in his tent".  Informed pt that his doctor is attempting to get the right mix of meds and to get full benefits may take up to 45 days. Spoke to pt several mins about possible options.   A:  Support and encouragement was offered. 15 min checks continued for safety.  R: Pt remains safe.

## 2012-07-03 NOTE — Progress Notes (Signed)
Patient ID: Edward [name], male   DOB: Jun 21, 1960, 52 y.o.   MRN: [id number] He has been in bed today only up to see MD  He is c/o dizziness and weakness. He has refused to take his medication today and has not eat meals today. V/S are within normal limits. Dr. Is aware of his  behavior.  He refused to fill out his self inventory.

## 2012-07-04 MED ORDER — ARIPIPRAZOLE 10 MG PO TABS
10.0000 mg | ORAL_TABLET | Freq: Two times a day (BID) | ORAL | Status: DC
  Administered 2012-07-04 – 2012-07-08 (×8): 10 mg via ORAL
  Filled 2012-07-04: qty 7
  Filled 2012-07-04 (×4): qty 1
  Filled 2012-07-04: qty 7
  Filled 2012-07-04 (×7): qty 1

## 2012-07-04 NOTE — Progress Notes (Signed)
Pt did not attend group. Staff encouraged pt to report to group, but pt refused. Pt remained in his room resting in bed for the duration of the group session.

## 2012-07-04 NOTE — Progress Notes (Signed)
[facility] MD Progress Note  07/04/2012 3:16 PM Garner Dullea  MRN:  [id number] Subjective:  Gibran endorses that he is really depressed. He "took a dive," states that he does not mind as much the voices. He did not realize that Abilfiy was working until he went off it. He admits to feeling very depressed. He states he did well on ECT. After ECT he was in remission for a long time Diagnosis:  Major Depression recurrent with psychotic features, PTSD  ADL's:  Intact  Sleep: Poor  Appetite:  Poor  Suicidal Ideation:  Plan:  ideas, no plans Intent:  denies Means:  denies Homicidal Ideation:  Plan:  denies Intent:  denies Means:  denies AEB (as evidenced by):  Psychiatric Specialty Exam: Review of Systems  Constitutional: Positive for malaise/fatigue.  HENT: Negative.   Eyes: Negative.   Respiratory: Negative.   Cardiovascular: Negative.   Gastrointestinal: Negative.   Genitourinary: Negative.   Musculoskeletal: Positive for back pain.  Skin: Negative.   Neurological: Positive for weakness.  Endo/Heme/Allergies: Negative.   Psychiatric/Behavioral: Positive for depression, suicidal ideas, hallucinations and substance abuse. The patient is nervous/anxious and has insomnia.     Blood pressure 126/85, pulse 85, temperature 98.3 F (36.8 C), temperature source Oral, resp. rate 18, height 5' 7.5" (1.715 m), weight 82.781 kg (182 lb 8 oz).Body mass index is 28.15 kg/(m^2).  General Appearance: Disheveled  Eye Solicitor::  Fair  Speech:  Clear and Coherent and Slow  Volume:  Decreased  Mood:  Depressed  Affect:  Depressed  Thought Process:  Coherent and Goal Directed  Orientation:  Full (Time, Place, and Person)  Thought Content:  worries, concerns  Suicidal Thoughts:  Yes.  without intent/plan  Homicidal Thoughts:  No  Memory:  Immediate;   Fair Recent;   Fair Remote;   Fair  Judgement:  Fair  Insight:  Present  Psychomotor Activity:  Decreased  Concentration:  Fair  Recall:  Fair   Akathisia:  No  Handed:  Right  AIMS (if indicated):     Assets:  Desire for Improvement  Sleep:  Number of Hours: 6.75   Current Medications: Current Facility-Administered Medications  Medication Dose Route Frequency Akelia Husted Last Rate Last Dose  . alum & mag hydroxide-simeth (MAALOX/MYLANTA) 200-200-20 MG/5ML suspension 30 mL  30 mL Oral Q4H PRN Kerry Hough, PA-C   30 mL at 06/29/12 1310  . ARIPiprazole (ABILIFY) tablet 10 mg  10 mg Oral BID Rachael Fee, MD      . gabapentin (NEURONTIN) tablet 600 mg  600 mg Oral TID Rachael Fee, MD   600 mg at 07/04/12 1224  . gabapentin (NEURONTIN) tablet 600 mg  600 mg Oral QHS Rachael Fee, MD   600 mg at 07/02/12 2155  . hydrOXYzine (ATARAX/VISTARIL) tablet 50 mg  50 mg Oral Daily PRN Kerry Hough, PA-C   50 mg at 07/03/12 0354  . levothyroxine (SYNTHROID, LEVOTHROID) tablet 100 mcg  100 mcg Oral Daily Kerry Hough, PA-C   100 mcg at 07/02/12 4098  . magnesium hydroxide (MILK OF MAGNESIA) suspension 30 mL  30 mL Oral Daily PRN Kerry Hough, PA-C      . mirtazapine (REMERON) tablet 30 mg  30 mg Oral QHS Rachael Fee, MD   30 mg at 07/02/12 2154  . multivitamin with minerals tablet 1 tablet  1 tablet Oral Daily Kerry Hough, PA-C   1 tablet at 07/02/12 0817  . naproxen (NAPROSYN) tablet 500  mg  500 mg Oral BID WC Rachael Fee, MD   500 mg at 07/03/12 1711  . nicotine (NICODERM CQ - dosed in mg/24 hours) patch 14 mg  14 mg Transdermal Q0600 Kerry Hough, PA-C   14 mg at 07/03/12 1610  . ondansetron (ZOFRAN-ODT) disintegrating tablet 4 mg  4 mg Oral Q8H PRN Rachael Fee, MD   4 mg at 07/01/12 1350  . thiamine (B-1) injection 100 mg  100 mg Intramuscular Once [employer], PA-C      . thiamine (VITAMIN B-1) tablet 100 mg  100 mg Oral Daily Kerry Hough, PA-C   100 mg at 07/02/12 0818  . Vilazodone HCl (VIIBRYD) TABS 40 mg  40 mg Oral Daily Rachael Fee, MD   40 mg at 07/02/12 0817  . zolpidem (AMBIEN) tablet 10 mg  10 mg  Oral QHS PRN Rachael Fee, MD        Lab Results: No results found for this or any previous visit (from the past 48 hour(s)).  Physical Findings: AIMS: Facial and Oral Movements Muscles of Facial Expression: None, normal Lips and Perioral Area: None, normal Jaw: None, normal Tongue: None, normal,Extremity Movements Upper (arms, wrists, hands, fingers): None, normal Lower (legs, knees, ankles, toes): None, normal, Trunk Movements Neck, shoulders, hips: None, normal, Overall Severity Severity of abnormal movements (highest score from questions above): None, normal Incapacitation due to abnormal movements: None, normal Patient's awareness of abnormal movements (rate only patient's report): No Awareness,    CIWA:  CIWA-Ar Total: 7 COWS:     Treatment Plan Summary: Daily contact with patient to assess and evaluate symptoms and progress in treatment Medication management  Plan: Supportive approach/coping skills/relapse prevention           D/C Haldol     D/C Cogentin           Abilify 10 mg BID           Referral for ECT   Medical Decision Making Problem Points:  Review of psycho-social stressors (1) Data Points:  Review of medication regiment & side effects (2) Review of new medications or change in dosage (2)  I certify that inpatient services furnished can reasonably be expected to improve the patient's condition.   LUGO,IRVING A 07/04/2012, 3:16 PM

## 2012-07-04 NOTE — Progress Notes (Signed)
Recreation Therapy Notes  Date: 03.13.2014 Time: 3:00pm Location: [address] Day Room      Group Topic/Focus: Leisure Education  Participation Level: Active  Participation Quality: Appropriate  Affect: Anxious and Flat  Cognitive: Appropriate   Additional Comments: Patient viewed informational video on QiGong. Patient did not participate in discussion about using recreation and leisure as a coping mechanism. Patient was not able to state a leisure and recreation activity he can use as a coping mechanism. Patient stated "I'm just not into this" LRT clarified if patient was refereeing to recreation therapy group or the hospital programming. Patient stated "Any of it, I just don't know what's wrong with me today, I'm just not into it." Patient sat with head resting in hands, legs crossed and bounded his foot up and down the entire group session.   Following the session patient stated to LRT he thought medication changes were negatively effecting him.   Edward [name], Edward [name]  Edward [name] 07/04/2012 4:15 PM

## 2012-07-04 NOTE — Progress Notes (Signed)
Patient ID: Edward [name], male   DOB: Dec 29, 1960, 52 y.o.   MRN: [id number] He did get up for noon medication then back to bed after he showered.  Stated he could not wait to get out of here to blow his brains out.

## 2012-07-04 NOTE — Progress Notes (Signed)
SW Intern facilitated phone intake for patient assessment at [facility] for ECT treatment. Pt follow up scheduled for Wednesday, March 19 at 2 pm.  SW Inter met with [facility] transitional services liaison to set up transportation to and from this appointment.

## 2012-07-04 NOTE — Progress Notes (Signed)
Patient resting quietly with eyes closed. Respirations even and unlabored, no distress noted. Q 15 minute check continues as ordered to maintain safety. 

## 2012-07-04 NOTE — Progress Notes (Signed)
SW Intern made individual contact with pt due to lack of attendance to group sessions.  Pt initially very resistant to SW. Affect depressed, flat, and blunted. Pt reports that he is very depressed and feels as though his medication is no longer working.  He is favorable to attend follow up care at [facility] however, is concerned that he will not be able to go there from dc.  Pt informed by SW that he can follow up with them following dc if no bed are available prior to dc.

## 2012-07-04 NOTE — Progress Notes (Signed)
Pt refused all  HS meds.

## 2012-07-04 NOTE — Progress Notes (Signed)
[facility] LCSW Aftercare Discharge Planning Group Note  07/04/2012 12:10 PM  Participation Quality:  Did not attend  Edward [name] L 07/04/2012, 12:10 PM

## 2012-07-04 NOTE — Progress Notes (Signed)
Patient ID: Edward [name], male   DOB: 06-30-1960, 52 y.o.   MRN: [id number]  He has been laying in bed awake with a cloth over his eyes. He has refused his medication and refused to let us take v/s's today.  Saying that he did not want any medication,that it did not help  Nothing dose. Stated that he has a SI plan but it is nobody's business, contracts for safety. Said Just let  Me leave I have a beer waiting for me on the outside.

## 2012-07-04 NOTE — Progress Notes (Signed)
[facility] LCSW Group Therapy  07/04/2012 6:14 PM  Type of Therapy:  Group Therapy  Participation Level:  Did Not Attend   Bournes, Roshelle L 07/04/2012, 6:14 PM

## 2012-07-05 LAB — COMPREHENSIVE METABOLIC PANEL
ALT: 290 U/L — ABNORMAL HIGH (ref 0–53)
AST: 239 U/L — ABNORMAL HIGH (ref 0–37)
Albumin: 3.7 g/dL (ref 3.5–5.2)
Alkaline Phosphatase: 117 U/L (ref 39–117)
Chloride: 104 mEq/L (ref 96–112)
Creatinine, Ser: 0.78 mg/dL (ref 0.50–1.35)
Potassium: 4.4 mEq/L (ref 3.5–5.1)
Sodium: 139 mEq/L (ref 135–145)
Total Bilirubin: 0.3 mg/dL (ref 0.3–1.2)

## 2012-07-05 NOTE — Progress Notes (Signed)
[facility] Group Notes:  (Nursing/MHT/Case Management/Adjunct)  Date:  07/05/2012  Time:  12:38 PM  Type of Therapy:  Psychoeducational Skills  Participation Level:  Minimal  Participation Quality:  Appropriate, Attentive and Resistant  Affect:  Appropriate and Flat  Cognitive:  Alert and Appropriate  Insight:  Appropriate  Engagement in Group:  Improving  Modes of Intervention:  Activity and Socialization  Summary of Progress/Problems: Pt was engaged in Therapeutic Activity where pts participated in a game called "Would you rather". Pt appeared to understand the concept of the group.   Edward [name] 07/05/2012, 12:38 PM

## 2012-07-05 NOTE — Progress Notes (Signed)
[facility] INPATIENT:  Family/Significant Other Suicide Prevention Education  Suicide Prevention Education:  Patient Refusal for Family/Significant Other Suicide Prevention Education: The patient Edward [name] has refused to provide written consent for family/significant other to be provided Family/Significant Other Suicide Prevention Education during admission and/or prior to discharge.  Physician notified.  SI prevention education completed with pt by SW Intern. Bournes, Roshelle L 07/05/2012, 4:10 PM

## 2012-07-05 NOTE — Progress Notes (Signed)
D   Pt was out of his room more this shift  He was requesting to know when he would get his knee brace/immobilizer   Said he had a consult with pt several days ago and never received the brace     He appears depressed and sad and has not been attending groups  He took his medications without problem and was instructed on wearing and applying the knee brace that pt brought over for the pt A   Verbal support given  Medications administered and effectiveness monitored   Q 15 min checks R   Pt safe at present

## 2012-07-05 NOTE — Progress Notes (Signed)
[facility] MD Progress Note  07/05/2012 3:43 PM Juanangel Soderholm  MRN:  [id number] Subjective:  [place]ham endorses that he is feeling light headed, nauseated. He is still feeling depressed and endorses suicidal ideations. He is hopeful with the fact that he has an appointment to be evaluated for ECT on Wednesday next week. He was able to contact the manager of a half way house and he would come Monday to meet with him. These two events make him be a little more hopeful than he has been. He is still struggling with the depression. Concerned about what can happened to him if he was to go out of here without a structure in place Diagnosis:  Alcohol Dependence, Cocaine abuse, Major depression with psychotic features, PTSD  ADL's:  Intact  Sleep: woke up early morning and was able to go back to sleep  Appetite:  Poor  Suicidal Ideation:  Plan:  denies Intent:  denies Means:  denies Homicidal Ideation:  Plan:  denies Intent:  denies Means:  denies AEB (as evidenced by):  Psychiatric Specialty Exam: Review of Systems  HENT: Negative.   Eyes: Negative.   Respiratory: Negative.   Cardiovascular: Negative.   Gastrointestinal: Negative.   Genitourinary: Negative.   Musculoskeletal: Positive for joint pain.  Skin: Negative.   Neurological: Positive for dizziness and weakness.  Endo/Heme/Allergies: Negative.   Psychiatric/Behavioral: Positive for depression, suicidal ideas and substance abuse. The patient is nervous/anxious.     Blood pressure 126/76, pulse 80, temperature 98.1 F (36.7 C), temperature source Oral, resp. rate 18, height 5' 7.5" (1.715 m), weight 82.781 kg (182 lb 8 oz).Body mass index is 28.15 kg/(m^2).  General Appearance: Disheveled  Eye Contact::  Minimal  Speech:  Clear and Coherent, Slow and not spontaneous  Volume:  Decreased  Mood:  Depressed and worried  Affect:  Restricted  Thought Process:  Coherent and Goal Directed  Orientation:  Full (Time, Place, and Person)  Thought  Content:  worries, concerns  Suicidal Thoughts:  Yes.  without intent/plan  Homicidal Thoughts:  No  Memory:  Immediate;   Fair Recent;   Fair Remote;   Fair  Judgement:  Fair  Insight:  Present  Psychomotor Activity:  Restlessness  Concentration:  Fair  Recall:  Fair  Akathisia:  No  Handed:  Right  AIMS (if indicated):     Assets:  Desire for Improvement  Sleep:  Number of Hours: 5.25   Current Medications: Current Facility-Administered Medications  Medication Dose Route Frequency Provider Last Rate Last Dose  . alum & mag hydroxide-simeth (MAALOX/MYLANTA) 200-200-20 MG/5ML suspension 30 mL  30 mL Oral Q4H PRN Kerry Hough, PA-C   30 mL at 06/29/12 1310  . ARIPiprazole (ABILIFY) tablet 10 mg  10 mg Oral BID Rachael Fee, MD   10 mg at 07/05/12 [id number]  . gabapentin (NEURONTIN) tablet 600 mg  600 mg Oral TID Rachael Fee, MD   600 mg at 07/05/12 1159  . gabapentin (NEURONTIN) tablet 600 mg  600 mg Oral QHS Rachael Fee, MD   600 mg at 07/04/12 2139  . hydrOXYzine (ATARAX/VISTARIL) tablet 50 mg  50 mg Oral Daily PRN Kerry Hough, PA-C   50 mg at 07/03/12 0354  . levothyroxine (SYNTHROID, LEVOTHROID) tablet 100 mcg  100 mcg Oral Daily Kerry Hough, PA-C   100 mcg at 07/05/12 4782  . magnesium hydroxide (MILK OF MAGNESIA) suspension 30 mL  30 mL Oral Daily PRN Kerry Hough, PA-C      .  mirtazapine (REMERON) tablet 30 mg  30 mg Oral QHS Rachael Fee, MD   30 mg at 07/04/12 2139  . multivitamin with minerals tablet 1 tablet  1 tablet Oral Daily Kerry Hough, PA-C   1 tablet at 07/05/12 9147  . naproxen (NAPROSYN) tablet 500 mg  500 mg Oral BID WC Rachael Fee, MD   500 mg at 07/05/12 8295  . nicotine (NICODERM CQ - dosed in mg/24 hours) patch 14 mg  14 mg Transdermal Q0600 Kerry Hough, PA-C   14 mg at 07/05/12 6213  . ondansetron (ZOFRAN-ODT) disintegrating tablet 4 mg  4 mg Oral Q8H PRN Rachael Fee, MD   4 mg at 07/05/12 1518  . thiamine (B-1) injection 100 mg  100  mg Intramuscular Once [employer], PA-C      . thiamine (VITAMIN B-1) tablet 100 mg  100 mg Oral Daily Kerry Hough, PA-C   100 mg at 07/05/12 0865  . Vilazodone HCl (VIIBRYD) TABS 40 mg  40 mg Oral Daily Rachael Fee, MD   40 mg at 07/05/12 7846  . zolpidem (AMBIEN) tablet 10 mg  10 mg Oral QHS PRN Rachael Fee, MD   10 mg at 07/04/12 2141    Lab Results: No results found for this or any previous visit (from the past 48 hour(s)).  Physical Findings: AIMS: Facial and Oral Movements Muscles of Facial Expression: None, normal Lips and Perioral Area: None, normal Jaw: None, normal Tongue: None, normal,Extremity Movements Upper (arms, wrists, hands, fingers): None, normal Lower (legs, knees, ankles, toes): None, normal, Trunk Movements Neck, shoulders, hips: None, normal, Overall Severity Severity of abnormal movements (highest score from questions above): None, normal Incapacitation due to abnormal movements: None, normal Patient's awareness of abnormal movements (rate only patient's report): No Awareness,    CIWA:  CIWA-Ar Total: 7 COWS:     Treatment Plan Summary: Daily contact with patient to assess and evaluate symptoms and progress in treatment Medication management  Plan: Supportive approach/coping skills/relapse prevention           Continue medications           Re check blood chemistries             Medical Decision Making Problem Points:  Review of last therapy session (1) and Review of psycho-social stressors (1) Data Points:  Review of medication regiment & side effects (2)  I certify that inpatient services furnished can reasonably be expected to improve the patient's condition.   LUGO,IRVING A 07/05/2012, 3:43 PM

## 2012-07-05 NOTE — Progress Notes (Signed)
D: Patient denies HI and A/V hallucinations and admits to thoughts of SI; patient reports sleep is well; reports appetite is good ; reports energy level is low ; reports ability to pay attention is improving; rates depression as 10/10; rates hopelessness 8/10;   A: Monitored q 15 minutes; patient encouraged to attend groups; patient educated about medications; patient given medications per physician orders; patient encouraged to express feelings and/or concerns  R: Patient is blunted but will elaborate feelings and thoughts if prompted; patient's interaction with staff and peers is isolative but appropriate but did attend groups this morning; patient was able to set goal to talk with staff 1:1 when having feelings of SI; patient is taking medications as prescribed and tolerating medications; patient is attending all groups today

## 2012-07-05 NOTE — Progress Notes (Signed)
[facility] LCSW Aftercare Discharge Planning Group Note  07/05/2012 8:8M  Participation Quality:  Attentive and Sharing  Affect:   Flat  Cognitive: Appropriate  Insight: Limited  Engagement in Group:  Limited  Modes of Intervention:  Exploration and Support  Summary of Progress/Problems:  Pt confirms suicidal ideation and denies homicidal ideation. Patient reports suicidal ideation is a constant for him and he would not commit suicide due to a promise he made to his sister. "I love her too much to do that." On a scale of 1 to 10 with ten being the most ever experienced, the patient rates depression at a 10 and anxiety at a 5. Patient reports he has made contact with [facility] and has transportation set up for March 19th ECT appointment.    Edward [name]

## 2012-07-05 NOTE — Progress Notes (Signed)
.  [facility] Group Notes:  (Nursing/MHT/Case Management/Adjunct)  Date: 07/04/2012  Time:  2100   Type of Therapy:  wrap up group   Participation Level:  Active  Participation Quality: Appropriate and attentive  Affect:  Irritable  Cognitive:  Appropriate  Insight:  Good  Engagement in Group:  Engaged  Modes of Intervention:  Clarification, Education and Support  Summary of Progress/Problems:Pt reported being out of bed was a good thing considering he had spent the last two days in bed depressed.  What got him out of bed was being "pissed off".  Pt reports getting angry today in order to have his concerns addressed.  Pt pleased that he has a referral for ECT which he had done 13 years ago with positive results, transportation through [place] and seen by a physical therapist with a brace on the way. Pt's concern now is where to go at discharge. Pt does not want to return to his tent where upon return would inevitably be a return to drinking. Shelah Lewandowsky 07/05/2012, 12:57 AM

## 2012-07-05 NOTE — Progress Notes (Signed)
[facility] LCSW Group Therapy  07/05/2012 1:15 PM  Type of Therapy:  Group Therapy from 1:15 to 2:30 PM  Participation Level:  Did Not attend  Edward [name]

## 2012-07-06 NOTE — Progress Notes (Signed)
Adult Psychoeducational Group Note  Date:  07/06/2012 Time:  9:50 AM  Group Topic/Focus:  Self-Inventory  Participation Level:  Did Not Attend  Additional Comments:  Pt did not attend despite encouragement from staff.  Alfonse Spruce 07/06/2012, 9:50 AM

## 2012-07-06 NOTE — Progress Notes (Signed)
D.  Pt pleasant on approach, denies complaints at this time.  Interacting appropriately within milieu.  Positive for evening group.  Denies SI/HI/hallucinations at this time.  A.  Support and encouragement offered  R.  Will continue to monitor, pt remains safe on unit

## 2012-07-06 NOTE — Progress Notes (Signed)
Patient did attend the evening speaker AA meeting.  

## 2012-07-06 NOTE — Progress Notes (Signed)
The patient attended the A. A. Meeting this evening.  

## 2012-07-06 NOTE — Progress Notes (Signed)
Adult Psychoeducational Group Note  Date:  07/06/2012 Time:  1315  Group Topic/Focus:  Healthy Coping Skills  Participation Level:  Did Not Attend  Pt decided not to attend group  Edward [name] [name] 07/06/2012, 2:15 PM

## 2012-07-06 NOTE — Progress Notes (Addendum)
Patient ID: Edward [name], male   DOB: February 16, 1961, 52 y.o.   MRN: [id number] [facility] MD Progress Note  07/06/2012 12:44 PM Edward [name]  MRN:  [id number] Subjective:  Eberardo endorses that he is still depressed, but rather relieved that he may eventually receive an ECT treatment. He states that he is looking forward to the ECT appointment on the 19th of this month. He says that he has a little hope now that he knows that it worked for him in the past. He continue to endorse suicidal ideations, but denies any plans and or intent to hurt self and or others. He wears right leg brace when ambulating.  Diagnosis:  Alcohol Dependence, Cocaine abuse, Major depression with psychotic features, PTSD  ADL's:  Intact  Sleep: "I slept off and on" 6.25 hours documented.  Appetite:  Poor  Suicidal Ideation:  Plan:  denies Intent:  denies Means:  denies Homicidal Ideation:  Plan:  denies Intent:  denies Means:  denies  AEB (as evidenced by):  Psychiatric Specialty Exam: Review of Systems  HENT: Negative.   Eyes: Negative.   Respiratory: Negative.   Cardiovascular: Negative.   Gastrointestinal: Negative.   Genitourinary: Negative.   Musculoskeletal: Positive for joint pain.  Skin: Negative.   Neurological: Positive for dizziness and weakness.  Endo/Heme/Allergies: Negative.   Psychiatric/Behavioral: Positive for depression, suicidal ideas and substance abuse. The patient is nervous/anxious.     Blood pressure 108/70, pulse 73, temperature 97.7 F (36.5 C), temperature source Oral, resp. rate 16, height 5' 7.5" (1.715 m), weight 82.781 kg (182 lb 8 oz).Body mass index is 28.15 kg/(m^2).  General Appearance: Disheveled  Eye Contact::  Minimal  Speech:  Clear and Coherent, Slow and not spontaneous  Volume:  Decreased  Mood:  Depressed and worried  Affect:  Restricted  Thought Process:  Coherent and Goal Directed  Orientation:  Full (Time, Place, and Person)  Thought Content:  worries, concerns   Suicidal Thoughts:  Yes.  without intent/plan  Homicidal Thoughts:  No  Memory:  Immediate;   Fair Recent;   Fair Remote;   Fair  Judgement:  Fair  Insight:  Present  Psychomotor Activity:  Restlessness  Concentration:  Fair  Recall:  Fair  Akathisia:  No  Handed:  Right  AIMS (if indicated):     Assets:  Desire for Improvement  Sleep:  Number of Hours: 6.25   Current Medications: Current Facility-Administered Medications  Medication Dose Route Frequency Provider Last Rate Last Dose  . alum & mag hydroxide-simeth (MAALOX/MYLANTA) 200-200-20 MG/5ML suspension 30 mL  30 mL Oral Q4H PRN Kerry Hough, PA-C   30 mL at 06/29/12 1310  . ARIPiprazole (ABILIFY) tablet 10 mg  10 mg Oral BID Rachael Fee, MD   10 mg at 07/06/12 0803  . gabapentin (NEURONTIN) tablet 600 mg  600 mg Oral TID Rachael Fee, MD   600 mg at 07/06/12 1203  . gabapentin (NEURONTIN) tablet 600 mg  600 mg Oral QHS Rachael Fee, MD   600 mg at 07/05/12 2154  . hydrOXYzine (ATARAX/VISTARIL) tablet 50 mg  50 mg Oral Daily PRN Kerry Hough, PA-C   50 mg at 07/03/12 0354  . levothyroxine (SYNTHROID, LEVOTHROID) tablet 100 mcg  100 mcg Oral Daily Kerry Hough, PA-C   100 mcg at 07/06/12 0865  . magnesium hydroxide (MILK OF MAGNESIA) suspension 30 mL  30 mL Oral Daily PRN Kerry Hough, PA-C      . mirtazapine (REMERON)  tablet 30 mg  30 mg Oral QHS Rachael Fee, MD   30 mg at 07/05/12 2154  . multivitamin with minerals tablet 1 tablet  1 tablet Oral Daily Kerry Hough, PA-C   1 tablet at 07/06/12 0804  . naproxen (NAPROSYN) tablet 500 mg  500 mg Oral BID WC Rachael Fee, MD   500 mg at 07/06/12 0803  . nicotine (NICODERM CQ - dosed in mg/24 hours) patch 14 mg  14 mg Transdermal Q0600 Kerry Hough, PA-C   14 mg at 07/06/12 1610  . ondansetron (ZOFRAN-ODT) disintegrating tablet 4 mg  4 mg Oral Q8H PRN Rachael Fee, MD   4 mg at 07/05/12 1518  . thiamine (B-1) injection 100 mg  100 mg Intramuscular Once  [employer], PA-C      . thiamine (VITAMIN B-1) tablet 100 mg  100 mg Oral Daily Kerry Hough, PA-C   100 mg at 07/06/12 9604  . Vilazodone HCl (VIIBRYD) TABS 40 mg  40 mg Oral Daily Rachael Fee, MD   40 mg at 07/06/12 0803  . zolpidem (AMBIEN) tablet 10 mg  10 mg Oral QHS PRN Rachael Fee, MD   10 mg at 07/05/12 2156    Lab Results:  Results for orders placed during the hospital encounter of 06/25/12 (from the past 48 hour(s))  COMPREHENSIVE METABOLIC PANEL     Status: Abnormal   Collection Time    07/05/12  7:30 PM      Result Value Range   Sodium 139  135 - 145 mEq/L   Potassium 4.4  3.5 - 5.1 mEq/L   Chloride 104  96 - 112 mEq/L   CO2 27  19 - 32 mEq/L   Glucose, Bld 112 (*) 70 - 99 mg/dL   BUN 16  6 - 23 mg/dL   Creatinine, Ser 5.40  0.50 - 1.35 mg/dL   Calcium 8.9  8.4 - 98.1 mg/dL   Total Protein 7.5  6.0 - 8.3 g/dL   Albumin 3.7  3.5 - 5.2 g/dL   AST 191 (*) 0 - 37 U/L   ALT 290 (*) 0 - 53 U/L   Alkaline Phosphatase 117  39 - 117 U/L   Total Bilirubin 0.3  0.3 - 1.2 mg/dL   GFR calc non Af Amer >90  >90 mL/min   GFR calc Af Amer >90  >90 mL/min   Comment:            The eGFR has been calculated     using the CKD EPI equation.     This calculation has not been     validated in all clinical     situations.     eGFR's persistently     <90 mL/min signify     possible Chronic Kidney Disease.    Physical Findings: AIMS: Facial and Oral Movements Muscles of Facial Expression: None, normal Lips and Perioral Area: None, normal Jaw: None, normal Tongue: None, normal,Extremity Movements Upper (arms, wrists, hands, fingers): None, normal Lower (legs, knees, ankles, toes): None, normal, Trunk Movements Neck, shoulders, hips: None, normal, Overall Severity Severity of abnormal movements (highest score from questions above): None, normal Incapacitation due to abnormal movements: None, normal Patient's awareness of abnormal movements (rate only patient's report):  No Awareness, Dental Status Current problems with teeth and/or dentures?: No Does patient usually wear dentures?: No  CIWA:  CIWA-Ar Total: 7 COWS:     Treatment Plan Summary: Daily contact  with patient to assess and evaluate symptoms and progress in treatment Medication management  Plan: Supportive approach/coping skills/relapse prevention Continue medications Reviewed new lab reports.            Medical Decision Making Problem Points:  Review of last therapy session (1) and Review of psycho-social stressors (1) Data Points:  Review of medication regiment & side effects (2)  I certify that inpatient services furnished can reasonably be expected to improve the patient's condition.   Sanjuana Kava, PMHNP 07/06/2012, 12:44 PM   Discussed with provider and agree with above findings.  Jacqulyn Cane, M.D.  07/06/2012 11:09 PM

## 2012-07-06 NOTE — Clinical Social Work Note (Signed)
[facility] Group Notes:  (Clinical Social Work)  07/06/2012     10-11AM  Summary of Progress/Problems:   The main focus of today's process group was for the patient to identify ways in which they have in the past sabotaged their own recovery. Motivational Interviewing was utilized to ask the group members what they get out of their substance use, and what they want to change.  The Stages of Change were explained, and members identified where they currently are with regard to stages of change.  The patient expressed that for the past 15 years he has been chasing his very first crack cocaine high unsuccessfully.  He admits he is a "crack head" and has also recently gone back to smoking marijuana due to pain in his knee.  He left prior to the end of group.  Type of Therapy:  Group Therapy - Process   Participation Level:  Active  Participation Quality:  Appropriate, Attentive, Sharing and Supportive  Affect:  Depressed  Cognitive:  Alert, Appropriate and Oriented  Insight:  Engaged  Engagement in Therapy:  Engaged  Modes of Intervention:  Education, Teacher, English as a foreign language, Exploration, Discussion, Motivational Interviewing   Ambrose Mantle, LCSW 07/06/2012, 12:00 PM

## 2012-07-06 NOTE — Progress Notes (Signed)
Pt. States he will be discharging to a halfway house Monday.  Pt. Is still depressed and states he has been for years but had an ECT done 3yrs. Ago at [facility] and it lasted for 81yrs..  Pt. Reports passive SI but contracts for safety.  He has an appointment on the 19th for evaluation for the ECT.   Pt. Is labile, interacting with his peers and laughing but voices anger and reports demanding staff get him an appt. For his needs.  Pt. Denies HI and VH but reports AH of a woman's voice telling him he is no good and he should be dead, but states it is a very soft spoken voice.  He can drown it out with the TV.

## 2012-07-06 NOTE — Progress Notes (Signed)
D:  Per pt self inventory pt reports sleeping well, appetite good, energy level low, ability to pay attention good, rates depression at a 9 out of 10 and hopelessness at a 8 out of 10.  Endorses SI contracts for safety, occasional auditory hallucinations (hears a woman's voice telling him negative things)     A:  Emotional support provided, Encouraged pt to continue with treatment plan and attend all group activities, q15 min checks maintained for safety.  R:  Pt did not attend am group despite encouragement of staff, is requesting ECT therapy, has appointment at [facility] on 3/19 for consult for ECT.

## 2012-07-07 NOTE — Progress Notes (Signed)
Adult Psychoeducational Group Note  Date:  07/07/2012 Time:  1515  Group Topic/Focus:  Making Healthy Choices:   The focus of this group is to help patients identify negative/unhealthy choices they were using prior to admission and identify positive/healthier coping strategies to replace them upon discharge.  Participation Level:  Did Not Attend  Participation Quality:    Affect:    Cognitive:   Insight:  Engagement in Group:    Modes of Intervention:   Additional Comments: although patient did not attend group pt did try to come in the last five minutes to listen.  Casilda Carls 07/07/2012, 4:44 PM

## 2012-07-07 NOTE — Progress Notes (Signed)
[facility] Group Notes:  (Nursing/MHT/Case Management/Adjunct)  Date:  07/07/2012  Time:  11:01 AM  Type of Therapy:  Psychoeducational Skills  Participation Level:  Did Not Attend   Dalia Heading 07/07/2012, 11:01 AM

## 2012-07-07 NOTE — Clinical Social Work Note (Signed)
[facility] Group Notes:  (Clinical Social Work)  07/07/2012  10:00-11:00AM  Summary of Progress/Problems:   The main focus of today's process group was to   identify the patient's current support system and decide on other supports that can be put in place to prevent future hospitalizations.  A handout was used to explain the 4 definitions/levels of support and to think about what support patient has given and received from others.  An emphasis was placed on using counselor, doctor, therapy groups, 12-step groups, and problem-specific support groups to expand supports. The patient expressed little, as he was late to group then left early.  Type of Therapy:  Process Group with Motivational Interviewing  Participation Level:  Minimal  Participation Quality:  in and out  Affect:  Blunted and Depressed  Cognitive:  Appropriate  Insight:  Limited  Engagement in Therapy:  Limited  Modes of Intervention:  Clarification, Education, Limit-setting, Problem-solving, Socialization, Support and Processing, Exploration, Discussion, Role-Play   Ambrose Mantle, LCSW 07/07/2012, 12:05 PM

## 2012-07-07 NOTE — Progress Notes (Signed)
Patient ID: Edward [name], male   DOB: August 13, 1960, 52 y.o.   MRN: [id number] D: Pt is awake and active on the unit this AM. Pt endorses passive SI but he does contract for safety. Pt rates their depression at 9 and hopelessness at 7. Pt's most recent CIWA score was 0. Pt mood is depressed an his affect is sad/blunted. Pt writes that he would like "ECT, AA and a halfway house" after discharge. Pt c/o N/V. Writer administered PRN medications.   A: Encouraged pt to discuss feelings with staff and administered medication per MD orders. Writer also encouraged pt to attend groups.  R: Pt is attending groups and tolerating medications well. Writer will continue to monitor. 15 minute checks are ongoing for safety.

## 2012-07-07 NOTE — Progress Notes (Signed)
Patient did attend the evening speaker AA meeting.  

## 2012-07-07 NOTE — Progress Notes (Signed)
Pt has been attending group and appears to have a connection with another pt on the hall and kept reassuring the pt that he had mad a phone call and everything would be okay. Pt contracts for safety and no SI or HI.

## 2012-07-07 NOTE — Progress Notes (Addendum)
Patient ID: Edward [name], male   DOB: [birth year], 52 y.o.   MRN: [id number] Patient ID: Edward [name], male   DOB: [birth year], 52 y.o.   MRN: [id number] [facility] MD Progress Note  07/07/2012 11:21 AM Vir Whetstine  MRN:  [id number]  Subjective:  Still endorsing depression and high anxiety. States, "My nerves are shot today. I'm feeling nauseated, but have not thrown up yet. I should be feeling better knowing that some positive things are lined up for me. I will be interviewing with a guy tomorrow, he owns a half-way house here in [place]. If it works out, I will have a place to live when I get out of here. And I'm looking forward for receive this ECT treatment soon. I have been through so much in my life, both good and a bad. 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   Eyes: Negative.   Respiratory: Negative.   Cardiovascular: Negative.   Gastrointestinal: Negative.   Genitourinary: Negative.   Musculoskeletal: Positive for joint pain.  Skin: Negative.   Neurological: Positive for dizziness and weakness.  Endo/Heme/Allergies: Negative.   Psychiatric/Behavioral: Positive for depression, suicidal ideas and substance abuse. The patient is nervous/anxious.     Blood pressure 126/77, pulse 75, temperature 96.7 F (35.9 C), temperature source Oral, resp. rate 20, height 5' 7.5" (1.715 m), weight 82.781 kg (182 lb 8 oz).Body mass index is 28.15 kg/(m^2).  General Appearance: Disheveled  Eye Contact::  Minimal  Speech:  Clear and Coherent, Slow and not spontaneous  Volume:  Decreased  Mood:  Depressed and worried, anxious   Affect:  Restricted  Thought Process:  Coherent and Goal Directed  Orientation:  Full (Time, Place, and Person)  Thought Content:  worries, concerns, anxious  Suicidal Thoughts:  Yes.  without intent/plan  Homicidal Thoughts:  No  Memory:  Immediate;   Fair Recent;   Fair Remote;   Fair  Judgement:  Fair  Insight:  Present  Psychomotor Activity:  Restlessness  Concentration:  Fair  Recall:  Fair  Akathisia:  No  Handed:  Right  AIMS (if indicated):     Assets:  Desire for Improvement  Sleep:  Number of Hours: 6.25   Current Medications: Current Facility-Administered Medications  Medication Dose Route Frequency Provider Last Rate Last Dose  . alum & mag hydroxide-simeth (MAALOX/MYLANTA) 200-200-20 MG/5ML suspension 30 mL  30 mL Oral Q4H PRN Kerry Hough, PA-C   30 mL at 06/29/12 1310  . ARIPiprazole (ABILIFY) tablet 10 mg  10 mg Oral BID Rachael Fee, MD   10 mg at 07/07/12 0759  . gabapentin (NEURONTIN) tablet 600 mg  600 mg Oral TID Rachael Fee, MD   600 mg at 07/07/12 0759  . gabapentin (NEURONTIN) tablet 600 mg  600 mg Oral QHS Rachael Fee, MD   600 mg at 07/05/12 2154  . hydrOXYzine (ATARAX/VISTARIL) tablet 50 mg  50 mg Oral Daily PRN Kerry Hough, PA-C   50 mg at 07/03/12 0354  . levothyroxine (SYNTHROID, LEVOTHROID) tablet 100 mcg  100 mcg Oral Daily Kerry Hough, PA-C  100 mcg at 07/07/12 0759  . magnesium hydroxide (MILK OF MAGNESIA) suspension 30 mL  30 mL Oral Daily PRN Kerry Hough, PA-C      . mirtazapine (REMERON) tablet 30 mg  30 mg Oral QHS Rachael Fee, MD   30 mg at 07/06/12 2131  . multivitamin with minerals tablet 1 tablet  1 tablet Oral Daily Kerry Hough, PA-C   1 tablet at 07/07/12 0759  . naproxen (NAPROSYN) tablet 500 mg  500 mg Oral BID WC Rachael Fee, MD   500 mg at 07/07/12 0759  . nicotine (NICODERM CQ - dosed in mg/24 hours) patch 14 mg  14 mg Transdermal Q0600 Kerry Hough, PA-C   14 mg at 07/07/12 0603  . ondansetron  (ZOFRAN-ODT) disintegrating tablet 4 mg  4 mg Oral Q8H PRN Rachael Fee, MD   4 mg at 07/07/12 1029  . thiamine (B-1) injection 100 mg  100 mg Intramuscular Once [employer], PA-C      . thiamine (VITAMIN B-1) tablet 100 mg  100 mg Oral Daily Kerry Hough, PA-C   100 mg at 07/07/12 0759  . Vilazodone HCl (VIIBRYD) TABS 40 mg  40 mg Oral Daily Rachael Fee, MD   40 mg at 07/07/12 0759  . zolpidem (AMBIEN) tablet 10 mg  10 mg Oral QHS PRN Rachael Fee, MD   10 mg at 07/06/12 2130    Lab Results:  Results for orders placed during the hospital encounter of 06/25/12 (from the past 48 hour(s))  COMPREHENSIVE METABOLIC PANEL     Status: Abnormal   Collection Time    07/05/12  7:30 PM      Result Value Range   Sodium 139  135 - 145 mEq/L   Potassium 4.4  3.5 - 5.1 mEq/L   Chloride 104  96 - 112 mEq/L   CO2 27  19 - 32 mEq/L   Glucose, Bld 112 (*) 70 - 99 mg/dL   BUN 16  6 - 23 mg/dL   Creatinine, Ser 1.61  0.50 - 1.35 mg/dL   Calcium 8.9  8.4 - 09.6 mg/dL   Total Protein 7.5  6.0 - 8.3 g/dL   Albumin 3.7  3.5 - 5.2 g/dL   AST 045 (*) 0 - 37 U/L   ALT 290 (*) 0 - 53 U/L   Alkaline Phosphatase 117  39 - 117 U/L   Total Bilirubin 0.3  0.3 - 1.2 mg/dL   GFR calc non Af Amer >90  >90 mL/min   GFR calc Af Amer >90  >90 mL/min   Comment:            The eGFR has been calculated     using the CKD EPI equation.     This calculation has not been     validated in all clinical     situations.     eGFR's persistently     <90 mL/min signify     possible Chronic Kidney Disease.    Physical Findings: AIMS: Facial and Oral Movements Muscles of Facial Expression: None, normal Lips and Perioral Area: None, normal Jaw: None, normal Tongue: None, normal,Extremity Movements Upper (arms, wrists, hands, fingers): None, normal Lower (legs, knees, ankles, toes): None, normal, Trunk Movements Neck, shoulders, hips: None, normal, Overall Severity Severity of abnormal movements (highest score  from questions above): None, normal Incapacitation due to abnormal movements: None, normal Patient's awareness of abnormal movements (rate only patient's report):  No Awareness, Dental Status Current problems with teeth and/or dentures?: No Does patient usually wear dentures?: No  CIWA:  CIWA-Ar Total: 7 COWS:     Treatment Plan Summary: Daily contact with patient to assess and evaluate symptoms and progress in treatment Medication management  Plan: Supportive approach/coping skills/relapse prevention Continue medications, reminded patient that he has Zofran ordered prn. Continue current treatment plan.            Medical Decision Making Problem Points:  Review of last therapy session (1) and Review of psycho-social stressors (1) Data Points:  Review of medication regiment & side effects (2)  I certify that inpatient services furnished can reasonably be expected to improve the patient's condition.   Sanjuana Kava, PMHNP 07/07/2012, 11:21 AM   Discussed with provider and agree with above findings.  Jacqulyn Cane, M.D.  07/07/2012 5:32 PM

## 2012-07-07 NOTE — Progress Notes (Signed)
Adult Psychoeducational Group Note  Date:  07/07/2012 Time:  1300  Group Topic/Focus:  Healthy Support Systems  Participation Level:  Did Not Attend  Pt decided not to attend group.  Edward [name] [name] 07/07/2012, 1:42 PM

## 2012-07-07 NOTE — Progress Notes (Signed)
D  Pt pleasant on approach, still reports some passive SI at times.  Positive for evening AA group.  Interacting appropriately within milieu.  Denies HI/hallucinations of any kind.  A.  Support and encouragement offered  R.  Will continue to monitor.

## 2012-07-08 MED ORDER — ARIPIPRAZOLE 10 MG PO TABS: 10.0000 mg | ORAL_TABLET | Freq: Two times a day (BID) | ORAL | Status: DC

## 2012-07-08 MED ORDER — GABAPENTIN 600 MG PO TABS: 600.0000 mg | ORAL_TABLET | Freq: Three times a day (TID) | ORAL | Status: DC

## 2012-07-08 MED ORDER — LEVOTHYROXINE SODIUM 100 MCG PO TABS: 100.0000 ug | ORAL_TABLET | Freq: Every day | ORAL | Status: DC

## 2012-07-08 MED ORDER — NAPROXEN 500 MG PO TABS: 500.0000 mg | ORAL_TABLET | Freq: Two times a day (BID) | ORAL | Status: DC | PRN

## 2012-07-08 MED ORDER — MIRTAZAPINE 30 MG PO TABS: 30.0000 mg | ORAL_TABLET | Freq: Every day | ORAL | Status: DC

## 2012-07-08 MED ORDER — VILAZODONE HCL 40 MG PO TABS
40.0000 mg | ORAL_TABLET | Freq: Every day | ORAL | Status: DC
  Filled 2012-07-08: qty 1

## 2012-07-08 MED ORDER — VILAZODONE HCL 40 MG PO TABS: 40.0000 mg | ORAL_TABLET | Freq: Every day | ORAL | Status: DC

## 2012-07-08 NOTE — Progress Notes (Signed)
Adult Psychoeducational Group Note  Date:  07/08/2012 Time:  4:43 PM  Group Topic/Focus:  Self Care:   The focus of this group is to help patients understand the importance of self-care in order to improve or restore emotional, physical, spiritual, interpersonal, and financial health.  Participation Level:  Active  Participation Quality:  Appropriate, Monopolizing and Redirectable  Affect:  Appropriate  Cognitive:  Appropriate  Insight: Appropriate and Good  Engagement in Group:  Engaged and Monopolizing  Modes of Intervention:  Discussion, Education and Support  Additional Comments:  Pt reported to group late. Pt completed a self-care inventory. Pt shared that he plans to take his meds as directed after discharging from [facility]. Pt  monopolized the group by sharing that, for the past few years, he has been "med compliant" whether abusing substances or not. Pt was easily redirected by staff. Pt verbalized a goal of improving his spiritual self-care by "communing with his Higher Power daily."  Elita Quick 07/08/2012, 4:43 PM

## 2012-07-08 NOTE — Progress Notes (Signed)
Patient ID: Edward [name], male   DOB: [birth year], 52 y.o.   MRN: [id number] [facility] LCSW Aftercare Discharge Planning Group Note  07/08/2012 8:45 AM  Participation Quality:  Attentive and Sharing  Affect:  Appropriate   Cognitive:  Alert, Appropriate and Oriented  Insight:  Engaged  Engagement in Group:  Engaged  Modes of Intervention:  Clarification, Discussion, Education, Control and instrumentation engineer of Progress/Problems:  Pt denies both suicidal and homicidal ideation but then reports "I always have thoughts of suicide, that is my baseline but I would never do it." Edward [name] reports continued depression yet feels hopeful as he has assessment in [place] with psychiatrist for ECT which has helped in past.  Patient feels good about upcoming interview with [facility] today and reports readiness for discharge.     Edward [name]

## 2012-07-08 NOTE — Discharge Summary (Signed)
Physician Discharge Summary Note  Patient:  Edward [name] is an 52 y.o., male MRN:  [id number] DOB:  21-Jun-1960 Patient phone:  [phone] (home)  Patient address:   [address] [place] [address],   Date of Admission:  06/25/2012 Date of Discharge: 07/08/2012  Reason for Admission:  Detox Discharge Diagnoses: Active Problems:   Alcohol dependence   Cocaine abuse   Substance induced mood disorder   Major depression   PTSD (post-traumatic stress disorder) Assessment:  AXIS I: Post Traumatic Stress Disorder, Substance Induced Mood Disorder and Alcohol dependence, Cocaine abuse  AXIS II: Deferred  AXIS III:  Past Medical History   Diagnosis  Date   .  Thyroid disease    .  Neuromuscular disorder      pinched nerve   .  Hypothyroidism    .  Mental disorder    .  Depression    .  Anxiety and depression    .  Major depression    .  PTSD (post-traumatic stress disorder)    .  Hepatitis      hep C    AXIS IV: economic problems, housing problems, occupational problems, other psychosocial or environmental problems and substancae abuse issues  AXIS V: 11-20 some danger of hurting self or others possible OR occasionally fails to maintain minimal personal hygiene OR gross impairment in communication   Review of Systems  Constitutional: Negative.  Negative for fever, chills, weight loss, malaise/fatigue and diaphoresis.  HENT: Negative for congestion and sore throat.   Eyes: Negative for blurred vision, double vision and photophobia.  Respiratory: Negative for cough, shortness of breath and wheezing.   Cardiovascular: Negative for chest pain, palpitations and PND.  Gastrointestinal: Negative for heartburn, nausea, vomiting, abdominal pain, diarrhea and constipation.  Musculoskeletal: Negative for myalgias, joint pain and falls.  Neurological: Negative for dizziness, tingling, tremors, sensory change, speech change, focal weakness, seizures, loss of consciousness, weakness and  headaches.  Endo/Heme/Allergies: Negative for polydipsia. Does not bruise/bleed easily.  Psychiatric/Behavioral: Negative for depression, suicidal ideas, hallucinations, memory loss and substance abuse. The patient is not nervous/anxious and does not have insomnia.    Level of Care:  OP  Hospital Course:  Edward [name] was admitted following a 3 day binge of alcohol and cocaine and THC. He was endorsing suicidal ideation, aggitation, mood lability, but denied suicidal intent or plan.  He was given medical clearance and tranfered to [facility] for further stabilization and treatment.     He was evaluated by the MD for his symptoms. He was started on Abilify 10mg  for depression,and Viibryd 40mg  a day, Neurontin 600mg  for anxiety and agitation.  For sleep he was given Mirtazapine 30mg  at hs.  Edward [name] was anxious and labile early in his admission, and was somewhat reclusive to his room. By the second week he was feeling more social and attended more of the unit programming.       He felt a ressurgance in motivation for achieving and maintaining sobriety and attended AA groups and participated actively in the unit groups.  He noted that he always has a constant level of suicidal ideation but he had returned to baseline. Knowing where he was going upon discharge and being an active participant in discharge goals seemed to help him a great deal.  Edward [name] had expressed an interest in ECT and was referred to Dr. Robet Leu in [facility] who does ECT.     On the day of discharge Edward [name] was in full contact with reality, denied  SI/HI, reported no AVH. He was in much improved condition than upon arrival and was anxious to move forward.   Consults:  None  Significant Diagnostic Studies:  None  Discharge Vitals:   Blood pressure 116/69, pulse 82, temperature 98.4 F (36.9 C), temperature source Oral, resp. rate 22, height 5' 7.5" (1.715 m), weight 82.781 kg (182 lb 8 oz). Body mass index is 28.15 kg/(m^2). Lab Results:   Results for  orders placed during the hospital encounter of 06/25/12 (from the past 72 hour(s))  COMPREHENSIVE METABOLIC PANEL     Status: Abnormal   Collection Time    07/05/12  7:30 PM      Result Value Range   Sodium 139  135 - 145 mEq/L   Potassium 4.4  3.5 - 5.1 mEq/L   Chloride 104  96 - 112 mEq/L   CO2 27  19 - 32 mEq/L   Glucose, Bld 112 (*) 70 - 99 mg/dL   BUN 16  6 - 23 mg/dL   Creatinine, Ser 4.78  0.50 - 1.35 mg/dL   Calcium 8.9  8.4 - 29.5 mg/dL   Total Protein 7.5  6.0 - 8.3 g/dL   Albumin 3.7  3.5 - 5.2 g/dL   AST 621 (*) 0 - 37 U/L   ALT 290 (*) 0 - 53 U/L   Alkaline Phosphatase 117  39 - 117 U/L   Total Bilirubin 0.3  0.3 - 1.2 mg/dL   GFR calc non Af Amer >90  >90 mL/min   GFR calc Af Amer >90  >90 mL/min   Comment:            The eGFR has been calculated     using the CKD EPI equation.     This calculation has not been     validated in all clinical     situations.     eGFR's persistently     <90 mL/min signify     possible Chronic Kidney Disease.    Physical Findings: AIMS: Facial and Oral Movements Muscles of Facial Expression: None, normal Lips and Perioral Area: None, normal Jaw: None, normal Tongue: None, normal,Extremity Movements Upper (arms, wrists, hands, fingers): None, normal Lower (legs, knees, ankles, toes): None, normal, Trunk Movements Neck, shoulders, hips: None, normal, Overall Severity Severity of abnormal movements (highest score from questions above): None, normal Incapacitation due to abnormal movements: None, normal Patient's awareness of abnormal movements (rate only patient's report): No Awareness, Dental Status Current problems with teeth and/or dentures?: No Does patient usually wear dentures?: No  CIWA:  CIWA-Ar Total: 7 COWS:     Psychiatric Specialty Exam: See Psychiatric Specialty Exam and Suicide Risk Assessment completed by Attending Physician prior to discharge.  Discharge destination:  Home  Is patient on multiple  antipsychotic therapies at discharge:  No   Has Patient had three or more failed trials of antipsychotic monotherapy by history:  No  Recommended Plan for Multiple Antipsychotic Therapies:  NA   Discharge Orders   Future Orders Complete By Expires     Diet - low sodium heart healthy  As directed     Discharge instructions  As directed     Comments:      Take all your medications as prescribed by your mental healthcare provider. Report any adverse effects and or reactions from your medicines to your outpatient provider promptly. Patient is instructed and cautioned to not engage in alcohol and or illegal drug use while on prescription medicines. In the event  of worsening symptoms, patient is instructed to call the crisis hotline, 911 and or go to the nearest ED for appropriate evaluation and treatment of symptoms. Follow-up with your primary care provider for your other medical issues, concerns and or health care needs.    Increase activity slowly  As directed         Medication List    STOP taking these medications       hydrOXYzine 50 MG tablet  Commonly known as:  ATARAX/VISTARIL     ibuprofen 800 MG tablet  Commonly known as:  ADVIL,MOTRIN      TAKE these medications     Indication   ARIPiprazole 10 MG tablet  Commonly known as:  ABILIFY  Take 1 tablet (10 mg total) by mouth 2 (two) times daily. For psychosis and mood stabilization and depression.   Indication:  Manic-Depression     gabapentin 600 MG tablet  Commonly known as:  NEURONTIN  Take 1 tablet (600 mg total) by mouth 3 (three) times daily. Anxiety and neuropathic pain.   Indication:  Neuropathic Pain     levothyroxine 100 MCG tablet  Commonly known as:  SYNTHROID, LEVOTHROID  Take 1 tablet (100 mcg total) by mouth daily. For thyroid disease.   Indication:  Underactive Thyroid     mirtazapine 30 MG tablet  Commonly known as:  REMERON  Take 1 tablet (30 mg total) by mouth at bedtime. For insomnia.    Indication:  Trouble Sleeping     naproxen 500 MG tablet  Commonly known as:  NAPROSYN  Take 1 tablet (500 mg total) by mouth 2 (two) times daily as needed. For pain      Vilazodone HCl 40 MG Tabs  Commonly known as:  VIIBRYD  Take 1 tablet (40 mg total) by mouth daily. For depression.   Indication:  Major Depressive Disorder           Follow-up Information   Follow up with [facility]- Dr. Clarene Duke, MD  On 07/10/2012. (Patient has an assessment scheduled with Dr. Clarene Duke on Wednesday, July 10, 2012 at 2:00 pm.)    Contact information:   [address] [place]  [phone]      Follow-up recommendations:  Activities: Resume activity as tolerated. Diet: Heart healthy low sodium diet Tests: Follow up testing will be determined by your out patient provider. Follow up relapse prevention plan Comments:    Total Discharge Time:  Greater than 30 minutes.  Signed: Rona Ravens. Mashburn RPAC 07/08/2012, 10:41 AM

## 2012-07-08 NOTE — Progress Notes (Signed)
Pt was discharged home today.  He denied any H/I or A/V hallucinations. He does admit to having SI but denies any plans.  He stated,"I am always thinking of suicide but I will not act on it"    He was given f/u appointment, rx, sample medications, hotline info booklet, and list of AA places provided by the case manager.  He voiced understanding to all instructions provided.  He declined the need for smoking cessation materials.  He removed his nicotine patch before he left.

## 2012-07-08 NOTE — Tx Team (Signed)
Interdisciplinary Treatment Plan Update (Adult)  Date: 07/08/2012  Time Reviewed: 9:41 AM   Progress in Treatment: Attending groups: Yes Participating in groups: Yes Taking medication as prescribed:  Yes Tolerating medication:  Yes Family/Significant othe contact made: No Patient understands diagnosis: Yes Discussing patient identified problems/goals with staff: Yes Medical problems stabilized or resolved:  Yes Denies suicidal/homicidal ideation: Yes Patient has not harmed self or Others: Yes  New problem(s) identified: None Identified  Discharge Plan or Barriers:  Patient will follow up with [facility] interview, assessment for ECT and [facility] for medication management  Additional comments: N/A  Reason for Continuation of Hospitalization: NA  Estimated length of stay: Discharge today  For review of initial/current patient goals, please see plan of care.  Attendees: Patient:     Family:     Physician:  Geoffery Lyons 07/08/2012 9:44 AM   Nursing:   Roswell Miners, RN 07/08/2012 9:44 AM   Clinical Social Worker Ronda Fairly 07/08/2012 9:44 AM   Other:  Robbie Louis, RN 07/08/2012 9:44 AM   Other:   Liliane Bade, Transitional Care Coordinator 07/08/2012 9:44 AM   Other:   07/08/2012 9:44 AM   Other:   07/08/2012 9:44 AM    Scribe for Treatment Team:   Carney Bern, LCSWA  07/08/2012 9:44 AM

## 2012-07-08 NOTE — Plan of Care (Signed)
Problem: Alteration in mood Goal: LTG-Pt's behavior demonstrates decreased signs of depression (Patient's behavior demonstrates decreased signs of depression to the point the patient is safe to return home and continue treatment in an outpatient setting)  Outcome: Completed/Met Date Met:  07/08/12 Patient reports depression is stable and he feels hopeful with assessment scheduled in [facility] for ECT which has worked well in past  Problem: Alteration in mood & ability to function due to Goal: LTG-Patient demonstrates decreased signs of withdrawal (Patient demonstrates decreased signs of withdrawal to the point the patient is safe to return home and continue treatment in an outpatient setting)  Outcome: Completed/Met Date Met:  07/08/12 Patient has not withdrawal symptoms, CIWA-Ar Total: 7     Problem: Ineffective individual coping Goal: STG: Patient will participate in after care plan Outcome: Completed/Met Date Met:  07/08/12 Patient invested in following up at [facility] for medication management and with assessment for ECT in [place] later this week. Patient has interview with [facility] today.

## 2012-07-08 NOTE — Progress Notes (Signed)
[facility] Adult Case Management Discharge Plan :  Will you be returning to the same living situation after discharge: No. Patient has interview at an [facility] At discharge, do you have transportation home?:Yes,  bus vouchers  Do you have the ability to pay for your medications:Yes,  through [place] can afford  Release of information consent forms completed and in the chart;  Patient's signature needed at discharge.  Patient to Follow up at: Follow-up Information   Follow up with [facility]- Dr. Clarene Duke, MD  On 07/10/2012. (Patient has an assessment scheduled with Dr. Clarene Duke on Wednesday, July 10, 2012 at 2:00 pm.)    Contact information:   [address] [place]  [phone]      Follow up with [facility]  On 07/11/2012. (Followup appointment is with Dr Georjean Mode at 2:30 on Thursday 3/20)    Contact information:   [address] [place], [place] [address] [facility] [phone] Valinda Hoar [phone]      Patient denies SI/HI:   Yes,  Patient admits thatSI is common thought for him and reports that he would not harm self due to agreement with family    Safety Planning and Suicide Prevention discussed:  Yes,  multiple times with patient, also one to one discussion with intern documented  [name] 07/08/2012, 12:10 PM

## 2012-07-08 NOTE — [facility] Suicide Risk Assessment (Signed)
Suicide Risk Assessment  Discharge Assessment     Demographic Factors:  Male, Caucasian and Unemployed  Mental Status Per Nursing Assessment::   On Admission:     Current Mental Status by Physician: In full contact with reality. There are no suicidal ideas, plans or intent. He is going to go to a half way house. He has an appointment Wednesday to be assess for ECT. He is hopeful that ECT is going to help   Loss Factors: Decline in physical health  Historical Factors: NA  Risk Reduction Factors:  Will go to a half way house, he is hopeful that the ECT will help him.   Continued Clinical Symptoms:  Depression:   Comorbid alcohol abuse/dependence Alcohol/Substance Abuse/Dependencies Chronic Pain  Cognitive Features That Contribute To Risk:  Closed-mindedness Thought constriction (tunnel vision)    Suicide Risk:  Minimal: No identifiable suicidal ideation.  Patients presenting with no risk factors but with morbid ruminations; may be classified as minimal risk based on the severity of the depressive symptoms  Discharge Diagnoses:   AXIS I:  Alcohol Dependence, Cocaine Abuse, MDD, PTSD AXIS II:  Deferred AXIS III:   Past Medical History  Diagnosis Date  . Thyroid disease   . Neuromuscular disorder     pinched nerve  . Hypothyroidism   . Mental disorder   . Depression   . Anxiety and depression   . Major depression   . PTSD (post-traumatic stress disorder)   . Hepatitis     hep C   AXIS IV:  other psychosocial or environmental problems AXIS V:  51-60 moderate symptoms  Plan Of Care/Follow-up recommendations:  Activity:  As tolerated Diet:  regular Follow up [facility] and an assessment for ECT Is patient on multiple antipsychotic therapies at discharge:  No   Has Patient had three or more failed trials of antipsychotic monotherapy by history:  No  Recommended Plan for Multiple Antipsychotic Therapies: N/A   Edward [name] A 07/08/2012, 12:58 PM

## 2012-07-11 NOTE — Progress Notes (Signed)
Patient Discharge Instructions:  After Visit Summary (AVS):   Faxed to:  07/11/12 Discharge Summary Note:   Faxed to:  07/11/12 Psychiatric Admission Assessment Note:   Faxed to:  07/11/12 Suicide Risk Assessment - Discharge Assessment:   Faxed to:  07/11/12 Faxed/Sent to the Next Level Care provider:  07/11/12 Faxed to [facility] @ [phone] Records sent via mail to : [facility] [address] [place], [place] [address]  Jerelene Redden, 07/11/2012, 2:12 PM

## 2012-08-22 ENCOUNTER — Emergency Department (HOSPITAL_COMMUNITY)
Admission: EM | Admit: 2012-08-22 | Discharge: 2012-08-23 | Disposition: A | Discharge status: Other Acute Inpatient Hospital | Payer: Medicaid Other | Attending: Emergency Medicine | Admitting: Emergency Medicine

## 2012-08-22 ENCOUNTER — Emergency Department (HOSPITAL_COMMUNITY): Payer: Medicaid Other

## 2012-08-22 ENCOUNTER — Encounter (HOSPITAL_COMMUNITY): Payer: Self-pay

## 2012-08-22 DIAGNOSIS — F1910 Other psychoactive substance abuse, uncomplicated: Secondary | ICD-10-CM | POA: Insufficient documentation

## 2012-08-22 DIAGNOSIS — Z79899 Other long term (current) drug therapy: Secondary | ICD-10-CM | POA: Insufficient documentation

## 2012-08-22 DIAGNOSIS — F17200 Nicotine dependence, unspecified, uncomplicated: Secondary | ICD-10-CM | POA: Insufficient documentation

## 2012-08-22 DIAGNOSIS — Z8659 Personal history of other mental and behavioral disorders: Secondary | ICD-10-CM | POA: Insufficient documentation

## 2012-08-22 DIAGNOSIS — E039 Hypothyroidism, unspecified: Secondary | ICD-10-CM | POA: Insufficient documentation

## 2012-08-22 DIAGNOSIS — Z8669 Personal history of other diseases of the nervous system and sense organs: Secondary | ICD-10-CM | POA: Insufficient documentation

## 2012-08-22 DIAGNOSIS — F341 Dysthymic disorder: Secondary | ICD-10-CM | POA: Insufficient documentation

## 2012-08-22 DIAGNOSIS — Z8619 Personal history of other infectious and parasitic diseases: Secondary | ICD-10-CM | POA: Insufficient documentation

## 2012-08-22 LAB — COMPREHENSIVE METABOLIC PANEL
AST: 281 U/L — ABNORMAL HIGH (ref 0–37)
CO2: 24 mEq/L (ref 19–32)
Calcium: 9.1 mg/dL (ref 8.4–10.5)
Creatinine, Ser: 0.8 mg/dL (ref 0.50–1.35)
GFR calc Af Amer: >90 mL/min (ref >90)
GFR calc non Af Amer: >90 mL/min (ref >90)

## 2012-08-22 LAB — RAPID URINE DRUG SCREEN, HOSP PERFORMED
Cocaine: POSITIVE — AB
Opiates: NOT DETECTED
Tetrahydrocannabinol: POSITIVE — AB

## 2012-08-22 LAB — CBC
MCH: 30.1 pg (ref 26.0–34.0)
MCV: 83.9 fL (ref 78.0–100.0)
Platelets: 185 10*3/uL (ref 150–400)
RBC: 4.79 MIL/uL (ref 4.22–5.81)

## 2012-08-22 MED ORDER — NICOTINE 21 MG/24HR TD PT24
21.0000 mg | MEDICATED_PATCH | Freq: Every day | TRANSDERMAL | Status: DC
  Administered 2012-08-23: 21 mg via TRANSDERMAL
  Filled 2012-08-22: qty 1

## 2012-08-22 MED ORDER — ZOLPIDEM TARTRATE 5 MG PO TABS: 5.0000 mg | ORAL_TABLET | Freq: Every evening | ORAL | Status: DC | PRN

## 2012-08-22 MED ORDER — LEVOTHYROXINE SODIUM 100 MCG PO TABS: 100.0000 ug | ORAL_TABLET | Freq: Every day | ORAL | Status: DC

## 2012-08-22 MED ORDER — MIRTAZAPINE 30 MG PO TABS
30.0000 mg | ORAL_TABLET | Freq: Every day | ORAL | Status: DC
  Administered 2012-08-23: 30 mg via ORAL
  Filled 2012-08-22: qty 1

## 2012-08-22 MED ORDER — ACETAMINOPHEN 325 MG PO TABS: 650.0000 mg | ORAL_TABLET | ORAL | Status: DC | PRN

## 2012-08-22 MED ORDER — ONDANSETRON HCL 4 MG PO TABS: 4.0000 mg | ORAL_TABLET | Freq: Three times a day (TID) | ORAL | Status: DC | PRN

## 2012-08-22 MED ORDER — LORAZEPAM 1 MG PO TABS: 1.0000 mg | ORAL_TABLET | Freq: Three times a day (TID) | ORAL | Status: DC | PRN

## 2012-08-22 MED ORDER — GABAPENTIN 300 MG PO CAPS
600.0000 mg | ORAL_CAPSULE | Freq: Three times a day (TID) | ORAL | Status: DC
  Administered 2012-08-23 (×2): 600 mg via ORAL
  Filled 2012-08-22 (×4): qty 2

## 2012-08-22 MED ORDER — VILAZODONE HCL 40 MG PO TABS
40.0000 mg | ORAL_TABLET | Freq: Every day | ORAL | Status: DC
  Administered 2012-08-23: 40 mg via ORAL
  Filled 2012-08-22: qty 1

## 2012-08-22 MED ORDER — LEVOTHYROXINE SODIUM 100 MCG PO TABS
100.0000 ug | ORAL_TABLET | Freq: Every morning | ORAL | Status: DC
Start: 2012-08-23 — End: 2012-08-23
  Administered 2012-08-23: 100 ug via ORAL
  Filled 2012-08-22 (×2): qty 1

## 2012-08-22 MED ORDER — IBUPROFEN 600 MG PO TABS: 600.0000 mg | ORAL_TABLET | Freq: Three times a day (TID) | ORAL | Status: DC | PRN

## 2012-08-22 MED ORDER — ARIPIPRAZOLE 10 MG PO TABS
10.0000 mg | ORAL_TABLET | Freq: Two times a day (BID) | ORAL | Status: DC
  Administered 2012-08-23: 10 mg via ORAL
  Filled 2012-08-22 (×3): qty 1

## 2012-08-22 MED ORDER — NAPROXEN 500 MG PO TABS
500.0000 mg | ORAL_TABLET | Freq: Two times a day (BID) | ORAL | Status: DC | PRN
  Administered 2012-08-23: 500 mg via ORAL
  Filled 2012-08-22: qty 1

## 2012-08-22 NOTE — ED Notes (Signed)
Report given to Latricia, RN 

## 2012-08-22 NOTE — ED Notes (Signed)
Pt here IVC with [employer], states that he wants to kill himself or someone else after a cocaine binge.

## 2012-08-22 NOTE — ED Notes (Signed)
Pt changed into blue scrubs and wanded by security.  Pt belongings as follows.  One wallet with misc. Cards and drivers license, one can of shaving cream, one speed stick deodorant,  Black lighter, crest toothpaste, 4 tooth brushes, hydrocortisone cream 1%, brown long sleeve button up shirt, blue long sleeve button up shirt, orange long sleeve button up short, grey shorts, e-cig charger, fresh scent 4 fl oz cleaner, black marker, aa 24 hour chip silver in color, tan work boots, blue jacket, midway ford hat, power supply, Statistician, white socks, tan pants, black belt, samsung cell phone, white under wear, envelope with papers in it, cell phone case, pen, [employer] law folder with misc. Papers in it.  aa book, personal journal with papers in it, cell phone book, carl long baseball card signed, power stick deodorant, magnum marker, kyng condom, durex condom, silver flashlight, electric trimmers, arrid deodorant, purple glasses, gauze roll, orange high lighter, aa chip white x 2,  aa chip green, Jergen bar soap.  All belongings placed in bag and marked.  All bags placed behind nurses station.

## 2012-08-22 NOTE — ED Provider Notes (Signed)
History     CSN: [id number]  Arrival date & time 08/22/12  2137   First MD Initiated Contact with Patient 08/22/12 2148      Chief Complaint  Patient presents with  . Medical Clearance    (Consider location/radiation/quality/duration/timing/severity/associated sxs/prior treatment) HPI Comments: Pt with PMH significant for depression, anxiety, PTSD, hepatitis, thyroid disease, presents to the ED via [facility] for detox and SI/HI without plan.  Pt states he went on a binge last night, used cocaine several times, smoked marijuana, and drank a lot of beer.  Last cocaine use earlier this am and drank several beers throughout the day today.  He realized that he was not in a good state of mind so he went to his behavioral health clinic and became outraged when another patient was helped before he was.  States he went outside, laid down in the middle of street, and began yelling.  He called 911 who picked him up from a location down the street.  Pt continues to endorse thoughts of harming himself and others, but no specific plan in place.  Denies AVH.  States he is complaint with his medication but he does not feel like they are helping him.  Would like detox and further psych evaluation.  Last detoxed in March 2014.  The history is provided by the patient.    Past Medical History  Diagnosis Date  . Thyroid disease   . Neuromuscular disorder     pinched nerve  . Hypothyroidism   . Mental disorder   . Depression   . Anxiety and depression   . Major depression   . PTSD (post-traumatic stress disorder)   . Hepatitis     hep C    Past Surgical History  Procedure Laterality Date  . No past surgeries      History reviewed. No pertinent family history.  History  Substance Use Topics  . Smoking status: Current Every Day Smoker -- 0.25 packs/day for 7 years    Types: Cigarettes  . Smokeless tobacco: Not on file  . Alcohol Use: 3.0 oz/week    5 Cans of beer per week     Comment: case beer  daily      Review of Systems  Psychiatric/Behavioral: Positive for suicidal ideas.       Detox  All other systems reviewed and are negative.    Allergies  Review of patient's allergies indicates no known allergies.  Home Medications   Current Outpatient Rx  Name  Route  Sig  Dispense  Refill  . ARIPiprazole (ABILIFY) 10 MG tablet   Oral   Take 1 tablet (10 mg total) by mouth 2 (two) times daily. For psychosis and mood stabilization and depression.   60 tablet   0   . gabapentin (NEURONTIN) 600 MG tablet   Oral   Take 1 tablet (600 mg total) by mouth 3 (three) times daily. Anxiety and neuropathic pain.   90 tablet   0   . levothyroxine (SYNTHROID, LEVOTHROID) 100 MCG tablet   Oral   Take 100 mcg by mouth every morning. For thyroid disease.         . mirtazapine (REMERON) 30 MG tablet   Oral   Take 1 tablet (30 mg total) by mouth at bedtime. For insomnia.   30 tablet   0   . naproxen (NAPROSYN) 500 MG tablet   Oral   Take 500 mg by mouth 2 (two) times daily as needed (pain). For pain         .  Vilazodone HCl (VIIBRYD) 40 MG TABS   Oral   Take 1 tablet (40 mg total) by mouth daily. For depression.   30 tablet   0     BP 120/76  Pulse 89  Temp(Src) 97.6 F (36.4 C) (Oral)  Resp 16  SpO2 92%  Physical Exam  Nursing note and vitals reviewed. Constitutional: He is oriented to person, place, and time. He appears well-developed and well-nourished.  Smells of EtOH  HENT:  Head: Normocephalic and atraumatic.  Mouth/Throat: Oropharynx is clear and moist.  Eyes: Conjunctivae and EOM are normal. Pupils are equal, round, and reactive to light.  Neck: Normal range of motion.  Cardiovascular: Normal rate, regular rhythm and normal heart sounds.   Pulmonary/Chest: Effort normal and breath sounds normal.  Abdominal: Soft. Bowel sounds are normal. There is no tenderness. There is no guarding.  Musculoskeletal: Normal range of motion.  Right clavicle  tenderness, normal ROM right shoulder, strong radial pulse and cap refill, sensation intact  Neurological: He is alert and oriented to person, place, and time.  Skin: Skin is warm and dry.  Psychiatric: His mood appears anxious. He is agitated. He expresses homicidal and suicidal ideation. He expresses no suicidal plans and no homicidal plans.  SI/HI without plan, no AVH    ED Course  Procedures (including critical care time)  Labs Reviewed  COMPREHENSIVE METABOLIC PANEL - Abnormal; Notable for the following:    AST 281 (*)    ALT 298 (*)    All other components within normal limits  ETHANOL - Abnormal; Notable for the following:    Alcohol, Ethyl (B) 182 (*)    All other components within normal limits  URINE RAPID DRUG SCREEN (HOSP PERFORMED) - Abnormal; Notable for the following:    Cocaine POSITIVE (*)    Tetrahydrocannabinol POSITIVE (*)    All other components within normal limits  CBC   Dg Clavicle Right  08/22/2012  *RADIOLOGY REPORT*  Clinical Data: Right clavicle pain.  RIGHT CLAVICLE - 2+ VIEWS  Comparison: None.  Findings: Right clavicle appears within normal limits.  There is no displaced fracture.  AC joint appears normal.  Visualized right chest is unremarkable.  IMPRESSION: Negative.   Original Report Authenticated By: Andreas Newport, M.D.      No diagnosis found.    MDM   Clavicle x-ray negative for acute fx.  UDS + cocaine and cannabinol.  Ethanol 182.  VS stable.  Pt medically cleared.  Moved to Psych ED with CIWA protocol, home meds, and holding orders placed.  ACT consulted- eval pending at this time.        Garlon Hatchet, PA-C 08/23/12 1528

## 2012-08-23 ENCOUNTER — Inpatient Hospital Stay (HOSPITAL_COMMUNITY)
Admission: AD | Admit: 2012-08-23 | Discharge: 2012-08-29 | DRG: 885 | Disposition: A | Discharge status: Home / Self Care | Payer: Medicaid Other | Source: Intra-hospital | Attending: Psychiatry | Admitting: Psychiatry

## 2012-08-23 DIAGNOSIS — F1410 Cocaine abuse, uncomplicated: Secondary | ICD-10-CM | POA: Diagnosis present

## 2012-08-23 DIAGNOSIS — Z79899 Other long term (current) drug therapy: Secondary | ICD-10-CM

## 2012-08-23 DIAGNOSIS — F332 Major depressive disorder, recurrent severe without psychotic features: Principal | ICD-10-CM | POA: Diagnosis present

## 2012-08-23 DIAGNOSIS — F329 Major depressive disorder, single episode, unspecified: Secondary | ICD-10-CM

## 2012-08-23 DIAGNOSIS — R45851 Suicidal ideations: Secondary | ICD-10-CM

## 2012-08-23 DIAGNOSIS — F1994 Other psychoactive substance use, unspecified with psychoactive substance-induced mood disorder: Secondary | ICD-10-CM | POA: Diagnosis present

## 2012-08-23 DIAGNOSIS — B1920 Unspecified viral hepatitis C without hepatic coma: Secondary | ICD-10-CM | POA: Diagnosis present

## 2012-08-23 DIAGNOSIS — F1020 Alcohol dependence, uncomplicated: Secondary | ICD-10-CM | POA: Diagnosis present

## 2012-08-23 DIAGNOSIS — F4310 Post-traumatic stress disorder, unspecified: Secondary | ICD-10-CM | POA: Diagnosis present

## 2012-08-23 MED ORDER — ALUM & MAG HYDROXIDE-SIMETH 200-200-20 MG/5ML PO SUSP: 30.0000 mL | ORAL | Status: DC | PRN

## 2012-08-23 MED ORDER — GABAPENTIN 300 MG PO CAPS
600.0000 mg | ORAL_CAPSULE | Freq: Three times a day (TID) | ORAL | Status: DC
  Administered 2012-08-23 – 2012-08-29 (×18): 600 mg via ORAL
  Filled 2012-08-23 (×3): qty 2
  Filled 2012-08-23: qty 42
  Filled 2012-08-23 (×4): qty 2
  Filled 2012-08-23: qty 42
  Filled 2012-08-23 (×5): qty 2
  Filled 2012-08-23: qty 42
  Filled 2012-08-23 (×9): qty 2

## 2012-08-23 MED ORDER — DIPHENHYDRAMINE HCL 25 MG PO CAPS: 25.0000 mg | ORAL_CAPSULE | Freq: Every evening | ORAL | Status: DC | PRN

## 2012-08-23 MED ORDER — ACETAMINOPHEN 325 MG PO TABS: 650.0000 mg | ORAL_TABLET | Freq: Four times a day (QID) | ORAL | Status: DC | PRN

## 2012-08-23 MED ORDER — NICOTINE 21 MG/24HR TD PT24
21.0000 mg | MEDICATED_PATCH | Freq: Every day | TRANSDERMAL | Status: DC
  Filled 2012-08-23 (×3): qty 1

## 2012-08-23 MED ORDER — ONDANSETRON 4 MG PO TBDP: 4.0000 mg | ORAL_TABLET | Freq: Four times a day (QID) | ORAL | Status: DC | PRN

## 2012-08-23 MED ORDER — NICOTINE 14 MG/24HR TD PT24
14.0000 mg | MEDICATED_PATCH | Freq: Every day | TRANSDERMAL | Status: DC
  Filled 2012-08-23 (×3): qty 1

## 2012-08-23 MED ORDER — MIRTAZAPINE 30 MG PO TABS
30.0000 mg | ORAL_TABLET | Freq: Every day | ORAL | Status: DC
  Filled 2012-08-23 (×6): qty 1

## 2012-08-23 MED ORDER — MAGNESIUM HYDROXIDE 400 MG/5ML PO SUSP
30.0000 mL | Freq: Every day | ORAL | Status: DC | PRN
  Filled 2012-08-23: qty 30

## 2012-08-23 MED ORDER — ARIPIPRAZOLE 10 MG PO TABS
10.0000 mg | ORAL_TABLET | Freq: Two times a day (BID) | ORAL | Status: DC
  Administered 2012-08-23 – 2012-08-29 (×12): 10 mg via ORAL
  Filled 2012-08-23 (×4): qty 1
  Filled 2012-08-23: qty 7
  Filled 2012-08-23 (×6): qty 1
  Filled 2012-08-23: qty 7
  Filled 2012-08-23 (×6): qty 1

## 2012-08-23 MED ORDER — VITAMIN B-1 100 MG PO TABS: 100.0000 mg | ORAL_TABLET | Freq: Every day | ORAL | Status: DC

## 2012-08-23 MED ORDER — CHLORDIAZEPOXIDE HCL 25 MG PO CAPS: 25.0000 mg | ORAL_CAPSULE | Freq: Four times a day (QID) | ORAL | Status: DC | PRN

## 2012-08-23 MED ORDER — VILAZODONE HCL 40 MG PO TABS
40.0000 mg | ORAL_TABLET | Freq: Every day | ORAL | Status: DC
  Administered 2012-08-24 – 2012-08-29 (×6): 40 mg via ORAL
  Filled 2012-08-23 (×7): qty 1
  Filled 2012-08-23: qty 7

## 2012-08-23 MED ORDER — LOPERAMIDE HCL 2 MG PO CAPS: 2.0000 mg | ORAL_CAPSULE | ORAL | Status: DC | PRN

## 2012-08-23 MED ORDER — HYDROXYZINE HCL 25 MG PO TABS: 25.0000 mg | ORAL_TABLET | Freq: Four times a day (QID) | ORAL | Status: DC | PRN

## 2012-08-23 MED ORDER — MAGNESIUM HYDROXIDE 400 MG/5ML PO SUSP: 30.0000 mL | Freq: Every day | ORAL | Status: DC | PRN

## 2012-08-23 MED ORDER — ACETAMINOPHEN 325 MG PO TABS
650.0000 mg | ORAL_TABLET | Freq: Four times a day (QID) | ORAL | Status: DC | PRN
  Administered 2012-08-24: 650 mg via ORAL

## 2012-08-23 MED ORDER — ADULT MULTIVITAMIN W/MINERALS CH
1.0000 | ORAL_TABLET | Freq: Every day | ORAL | Status: DC
  Administered 2012-08-23: 1 via ORAL
  Filled 2012-08-23: qty 1

## 2012-08-23 MED ORDER — LEVOTHYROXINE SODIUM 100 MCG PO TABS
100.0000 ug | ORAL_TABLET | Freq: Every morning | ORAL | Status: DC
  Administered 2012-08-24 – 2012-08-29 (×6): 100 ug via ORAL
  Filled 2012-08-23 (×4): qty 1
  Filled 2012-08-23: qty 2
  Filled 2012-08-23 (×2): qty 1
  Filled 2012-08-23: qty 7
  Filled 2012-08-23: qty 1

## 2012-08-23 MED ORDER — THIAMINE HCL 100 MG/ML IJ SOLN
100.0000 mg | Freq: Once | INTRAMUSCULAR | Status: AC
  Administered 2012-08-23: 100 mg via INTRAMUSCULAR
  Filled 2012-08-23: qty 2

## 2012-08-23 NOTE — ED Notes (Signed)
Pt states "I am too tired to want to hurt anyone or to hurt myself. I feel confused". Oriented to situation.

## 2012-08-23 NOTE — BHH Counselor (Signed)
Patient accepted to [facility] by Dahlia Byes, NP to [name] from [facility] The attending physician is Dr. Geoffery Lyons. She accepted him to [id number]. EDP-Dr. Loren Racer made aware of patients disposition. Patients nurse-Christine also made aware. The nursing call report # is [phone]. Patient's support paperwork was completed by this Clinical research associate and faxed to [facility]. Patient is her under IVC and will need transportation to [facility] via [employer]. Patients nurse will complete transportation arrangements.

## 2012-08-23 NOTE — BH Assessment (Signed)
Assessment Note   Edward [name] is an 52 y.o. male  presenting to [facility] transferred from [facility] for placement. Patient is her under IVC. Patient reports feeling suicidal. He denies suicidal plan to this Clinical research associate. He however told staff upon arrival that he wanted to slit his wrist. Patient is unable to contract for safety. He reports 2 prior suicide attempts and both were overdoses. Patient reports feeling suicidal his "entire life". Patient reports feeling depressed due to his homelessness and lack of support.   Patient also reported homicidal thoughts to staff upon arrival. He reportedly wants punch someone in the face. During the [facility] assessment patient admitted that he has occasional thoughts of wanting to harm others but does not have those thoughts currently. He was able to contract for safety against harming others.   Patient reports auditory hallucinations. He describes his hallucinations as a women telling him, "You are better off dead". He reports that he was having auditory hallucinations during the ACT assessment. 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   Admits to alcohol and drug abuse. Sts that he was sober for 2 months and relapsed on alcohol day before yesterday. He reports drinking 3 beers at that time. He also used marijuana yesterday for the first time in 2 months. Patient then reported crack cocaine 3 nights ago "all day".  Patient has been hospitalized at [facility] in 2011 and 2013. He also reports being hospitalized at facilities in [place] approximately 1990.  Axis I: Substance Induced Mood Disorder ; Depressive Disorder NOS, Substance Induced Mood Disorder Axis II: Deferred Axis III:  Past Medical History  Diagnosis Date  . Thyroid disease   . Neuromuscular disorder     pinched nerve  . Hypothyroidism   . Mental disorder   . Depression   . Anxiety and depression   . Major depression   . PTSD (post-traumatic stress disorder)   . Hepatitis     hep C   Axis IV: other  psychosocial or environmental problems, problems related to social environment, problems with access to health care services and problems with primary support group Axis V: 31-40 impairment in reality testing  Past Medical History:  Past Medical History  Diagnosis Date  . Thyroid disease   . Neuromuscular disorder     pinched nerve  . Hypothyroidism   . Mental disorder   . Depression   . Anxiety and depression   . Major depression   . PTSD (post-traumatic stress disorder)   . Hepatitis     hep C    Past Surgical History  Procedure Laterality Date  . No past surgeries      Family History: History reviewed. No pertinent family history.  Social History:  reports that he has been smoking Cigarettes.  He has a 1.75 pack-year smoking history. He does not have any smokeless tobacco history on file. He reports that he drinks about 3.0 ounces of alcohol per week. He reports that he uses illicit drugs ("Crack" cocaine, Marijuana, and Cocaine).  Additional Social History:  Alcohol / Drug Use Pain Medications: SEE MAR Prescriptions: SEE MAR Over the Counter: SEE MAR History of alcohol / drug use?: Yes Substance #1 Name of Substance 1: Alcohol  1 - Age of First Use: 52 yrs old  1 - Amount (size/oz): patient has a history of heavy alcohol use drinking (2) 12 packs of beer; he was sober for 2 months; relapsed the day before yesterday drinking 3 beers 1 - Frequency: 1x use  in 2 months 1 - Duration: 1x used in 2 months 1 - Last Use / Amount: "day before yesterday"; patient reports drinking 3 beers Substance #2 Name of Substance 2: THC 2 - Age of First Use: 52 yrs old  2 - Amount (size/oz): patient has a history of heavy marijuana; he was sober for 2 months; relapsed the day before yesterday smoking 2 grams 2 - Frequency: 1x in 2 months 2 - Duration: 1x in 2 months 2 - Last Use / Amount: 08/22/2012 Substance #3 Name of Substance 3: Crack cocaine 3 - Age of First Use: 52 yrs old 3 -  Amount (size/oz): "I binged all day" 3 - Frequency: daily in the past; remained sober for 2 months; relapsed 3 nights ago 3 - Duration: "1x 3 nights ago" 3 - Last Use / Amount: 3 nights ago  CIWA: CIWA-Ar BP: 124/81 mmHg Pulse Rate: 65 COWS:    Allergies: No Known Allergies  Home Medications:  (Not in a hospital admission)  OB/GYN Status:  No LMP for male patient.  General Assessment Data Location of Assessment: [facility] Living Arrangements: Other (Comment) (Homeless) Can pt return to current living arrangement?: Yes Admission Status: Involuntary Is patient capable of signing voluntary admission?: Yes Transfer from: Acute Hospital Referral Source: Self/Family/Friend     Risk to self Suicidal Ideation: Yes-Currently Present Suicidal Intent: Yes-Currently Present Is patient at risk for suicide?: Yes Suicidal Plan?: No-Not Currently/Within Last 6 Months Access to Means: No What has been your use of drugs/alcohol within the last 12 months?:  (patient reports relapsing on alcohol, THC, and crack cocaine) Previous Attempts/Gestures: Yes How many times?:  (2x's) Other Self Harm Risks:  (none reported) Triggers for Past Attempts: Unpredictable;Hallucinations Intentional Self Injurious Behavior: None Family Suicide History: No Recent stressful life event(s): Other (Comment) (ongoing homelessness and substance abuse issues) Persecutory voices/beliefs?: No Depression: Yes Depression Symptoms: Loss of interest in usual pleasures;Feeling worthless/self pity;Guilt;Fatigue;Isolating;Insomnia;Despondent Substance abuse history and/or treatment for substance abuse?: Yes Suicide prevention information given to non-admitted patients: Not applicable  Risk to Others Homicidal Ideation: No Thoughts of Harm to Others: No Current Homicidal Intent: No Current Homicidal Plan: No Access to Homicidal Means: No Identified Victim:  (n/a) History of harm to others?: No Assessment of Violence:  None Noted Violent Behavior Description:  (patient is calm and cooperative during his stay here at [facility]) Does patient have access to weapons?: No Criminal Charges Pending?: No Does patient have a court date: No  Psychosis Hallucinations: Auditory (womens voice telling him he is better off dead) Delusions: None noted  Mental Status Report Appear/Hygiene: Disheveled Eye Contact: Fair Motor Activity: Unremarkable Speech: Logical/coherent Level of Consciousness: Alert Mood: Depressed Affect: Appropriate to circumstance Anxiety Level: Minimal Thought Processes: Coherent Judgement: Impaired Orientation: Person;Place;Time;Situation Obsessive Compulsive Thoughts/Behaviors: None  Cognitive Functioning Concentration: Decreased Memory: Recent Intact;Remote Intact IQ: Average Insight: Poor Impulse Control: Poor Appetite: Poor Weight Loss:  (0) Weight Gain:  (0) Sleep: Increased Total Hours of Sleep:  (10-12 hours per day) Vegetative Symptoms: None  ADLScreening [facility] Assessment Services) Patient's cognitive ability adequate to safely complete daily activities?: Yes Patient able to express need for assistance with ADLs?: Yes Independently performs ADLs?: Yes (appropriate for developmental age)  Abuse/Neglect [facility]) Physical Abuse: Denies Verbal Abuse: Denies Sexual Abuse: Denies  Prior Inpatient Therapy Prior Inpatient Therapy: Yes Prior Therapy Dates: 1990's, 2011, 03/2012 Prior Therapy Facilty/Provider(s): Facilities in [place], in [place] (unknown) and [facility] Reason for Treatment: SI/SA/Depression  Prior Outpatient Therapy Prior Outpatient Therapy:  Yes Prior Therapy Dates: 2012, Current Prior Therapy Facilty/Provider(s): [facility] (2012), [facility] (current) Reason for Treatment: Depression, SA  ADL Screening (condition at time of admission) Patient's cognitive ability adequate to safely complete daily activities?: Yes Patient able to express need for assistance with ADLs?:  Yes Independently performs ADLs?: Yes (appropriate for developmental age) Weakness of Legs: None Weakness of Arms/Hands: None  Home Assistive Devices/Equipment Home Assistive Devices/Equipment: None    Abuse/Neglect Assessment (Assessment to be complete while patient is alone) Physical Abuse: Denies Verbal Abuse: Denies Sexual Abuse: Denies Exploitation of patient/patient's resources: Denies Self-Neglect: Denies Values / Beliefs Cultural Requests During Hospitalization: None Spiritual Requests During Hospitalization: None   Advance Directives (For Healthcare) Advance Directive: Patient does not have advance directive Nutrition Screen- [facility] Adult/[facility] Patient's home diet: Regular  Additional Information 1:1 In Past 12 Months?: No CIRT Risk: No Elopement Risk: No Does patient have medical clearance?: Yes     Disposition:  Disposition Initial Assessment Completed for this Encounter: Yes Disposition of Patient: Referred to;Inpatient treatment program [facility]) Type of inpatient treatment program: Adult Patient referred to: Other (Comment) (Pt accepted to [facility] by Geoffery Lyons, MD [address] hall)  On Site Evaluation by:   Reviewed with Physician:     Melynda Ripple [facility] 08/23/2012 2:10 PM

## 2012-08-23 NOTE — ED Notes (Signed)
Pt. Being transferred to [facility] accompanied by the [employer]

## 2012-08-23 NOTE — Consult Note (Signed)
Reason for Consult: Polysubstance abuse Referring Physician: Dr. Barbette Or Edward [name] is an 52 y.o. male.  HPI: Patient presented to the [facility] long emergency department requesting treatment for substance abuse. His urine drug screen was positive for cocaine marijuana and alcohol.  Mental Status Examination: Patient appeared as per his stated age,  and fairly groomed, and maintaining good eye contact. Patient has depressed mood and his affect was constricted. He has normal rate, rhythm, and volume of speech. His thought process is linear and goal directed. Patient has denied suicidal, homicidal ideations, intentions or plans. Patient has no evidence of auditory or visual hallucinations, delusions, and paranoia. Patient has fair insight judgment and impulse control.  Past Medical History  Diagnosis Date  . Thyroid disease   . Neuromuscular disorder     pinched nerve  . Hypothyroidism   . Mental disorder   . Depression   . Anxiety and depression   . Major depression   . PTSD (post-traumatic stress disorder)   . Hepatitis     hep C    Past Surgical History  Procedure Laterality Date  . No past surgeries      History reviewed. No pertinent family history.  Social History:  reports that he has been smoking Cigarettes.  He has a 1.75 pack-year smoking history. He does not have any smokeless tobacco history on file. He reports that he drinks about 3.0 ounces of alcohol per week. He reports that he uses illicit drugs ("Crack" cocaine, Marijuana, and Cocaine).  Allergies: No Known Allergies  Medications: I have reviewed the patient's current medications.  Results for orders placed during the hospital encounter of 08/22/12 (from the past 48 hour(s))  URINE RAPID DRUG SCREEN (HOSP PERFORMED)     Status: Abnormal   Collection Time    08/22/12 10:02 PM      Result Value Range   Opiates NONE DETECTED  NONE DETECTED   Cocaine POSITIVE (*) NONE DETECTED   Benzodiazepines NONE DETECTED   NONE DETECTED   Amphetamines NONE DETECTED  NONE DETECTED   Tetrahydrocannabinol POSITIVE (*) NONE DETECTED   Barbiturates NONE DETECTED  NONE DETECTED   Comment:            DRUG SCREEN FOR MEDICAL PURPOSES     ONLY.  IF CONFIRMATION IS NEEDED     FOR ANY PURPOSE, NOTIFY LAB     WITHIN 5 DAYS.                LOWEST DETECTABLE LIMITS     FOR URINE DRUG SCREEN     Drug Class       Cutoff (ng/mL)     Amphetamine      1000     Barbiturate      200     Benzodiazepine   200     Tricyclics       300     Opiates          300     Cocaine          300     THC              50  CBC     Status: None   Collection Time    08/22/12 10:04 PM      Result Value Range   WBC 7.3  4.0 - 10.5 K/uL   RBC 4.79  4.22 - 5.81 MIL/uL   Hemoglobin 14.4  13.0 - 17.0 g/dL   HCT 40.2  39.0 - 52.0 %   MCV 83.9  78.0 - 100.0 fL   MCH 30.1  26.0 - 34.0 pg   MCHC 35.8  30.0 - 36.0 g/dL   RDW 45.4  09.8 - 11.9 %   Platelets 185  150 - 400 K/uL  COMPREHENSIVE METABOLIC PANEL     Status: Abnormal   Collection Time    08/22/12 10:04 PM      Result Value Range   Sodium 139  135 - 145 mEq/L   Potassium 3.9  3.5 - 5.1 mEq/L   Chloride 102  96 - 112 mEq/L   CO2 24  19 - 32 mEq/L   Glucose, Bld 95  70 - 99 mg/dL   BUN 14  6 - 23 mg/dL   Creatinine, Ser 1.47  0.50 - 1.35 mg/dL   Calcium 9.1  8.4 - 82.9 mg/dL   Total Protein 7.9  6.0 - 8.3 g/dL   Albumin 3.7  3.5 - 5.2 g/dL   AST 562 (*) 0 - 37 U/L   ALT 298 (*) 0 - 53 U/L   Alkaline Phosphatase 93  39 - 117 U/L   Total Bilirubin 0.5  0.3 - 1.2 mg/dL   GFR calc non Af Amer >90  >90 mL/min   GFR calc Af Amer >90  >90 mL/min   Comment:            The eGFR has been calculated     using the CKD EPI equation.     This calculation has not been     validated in all clinical     situations.     eGFR's persistently     <90 mL/min signify     possible Chronic Kidney Disease.  ETHANOL     Status: Abnormal   Collection Time    08/22/12 10:04 PM      Result  Value Range   Alcohol, Ethyl (B) 182 (*) 0 - 11 mg/dL   Comment:            LOWEST DETECTABLE LIMIT FOR     SERUM ALCOHOL IS 11 mg/dL     FOR MEDICAL PURPOSES ONLY    Dg Clavicle Right  08/22/2012  *RADIOLOGY REPORT*  Clinical Data: Right clavicle pain.  RIGHT CLAVICLE - 2+ VIEWS  Comparison: None.  Findings: Right clavicle appears within normal limits.  There is no displaced fracture.  AC joint appears normal.  Visualized right chest is unremarkable.  IMPRESSION: Negative.   Original Report Authenticated By: Andreas Newport, M.D.     Positive for anxiety, bad mood, behavior problems, depression, excessive alcohol consumption, illegal drug usage and sleep disturbance Blood pressure 98/61, pulse 67, temperature 97.7 F (36.5 C), temperature source Oral, resp. rate 18, SpO2 96.00%.   Assessment/Plan: Substance-induced mood disorder Cocaine intoxication Alcohol intoxication Cannabis abuse  Recommendation: Recommend inpatient psychiatric hospitalization for this substance abuse treatmen.t   Edward [name],Edward R. 08/23/2012, 11:47 AM

## 2012-08-23 NOTE — Tx Team (Signed)
Initial Interdisciplinary Treatment Plan  PATIENT STRENGTHS: (choose at least two) Ability for insight General fund of knowledge  PATIENT STRESSORS: Financial difficulties Substance abuse   PROBLEM LIST: Problem List/Patient Goals Date to be addressed Date deferred Reason deferred Estimated date of resolution  Substance Abuse      Depression                                                 DISCHARGE CRITERIA:  Improved stabilization in mood, thinking, and/or behavior Need for constant or close observation no longer present  PRELIMINARY DISCHARGE PLAN: Attend 12-step recovery group Outpatient therapy  PATIENT/FAMIILY INVOLVEMENT: This treatment plan has been presented to and reviewed with the patient, Edward [name], and/or family member, .  The patient and family have been given the opportunity to ask questions and make suggestions.  Noah Charon 08/23/2012, 4:36 PM

## 2012-08-23 NOTE — ED Notes (Signed)
Pt transferred from triage, presents with SI & HI.  Pt reports he wants to kill himself, slash wrists and feels like punching someone in the face.  Admits to alcohol and drug abuse.  Drinks 12 beers per day.  Abuses cocaine & marijuana.  Feeling hopeless.  Pt reports diag, with PTSD, Major Depression and anxiety.  Calm& cooperative at present.

## 2012-08-23 NOTE — Progress Notes (Signed)
Patient ID: Montrelle Eddings, male   DOB: Aug 23, 1960, 52 y.o.   MRN: [id number]  Pt was sleep most of the night, pt avoided Clinical research associate. D: Pt observed sleeping in bed with eyes closed. RR even and unlabored. No distress noted  .  A: Q 15 minute checks were done for safety.  R: safety maintained on unit.

## 2012-08-23 NOTE — Progress Notes (Signed)
[facility] ED CM noted pt with medicaid coverage but no pcp listed Spoke with pt who confirms no pcp but aware of how to obtain one with medicaid coverage/DSS He reports he has made an attempt to get DSS to change pcp unsuccessfully but will try again

## 2012-08-23 NOTE — Progress Notes (Signed)
Patient came in because he stated that he was sober at a halfway house and he used about 300 dollars worth of crack/cocaine on Thursday; patient also wanted to kill himself or hurt someone else and having thoughts to cut wrist or punch someone in the face; etoh level was 182 and patient stated he drinks 1-2 beers occassionally; uds was positive for cocaine and thc; patient states that he does not drink more than 6 beers at a time; patient is still have thoughts of SI but contracts for safety; patient denies HI and A/V hallucinations; history of PTSD, depression, anxiety,

## 2012-08-23 NOTE — ED Provider Notes (Signed)
Medical screening examination/treatment/procedure(s) were performed by non-physician practitioner and as supervising physician I was immediately available for consultation/collaboration.   Glynn Octave, MD 08/23/12 [phone]

## 2012-08-23 NOTE — Progress Notes (Signed)
Cm noted in 06/2012 [facility] notes pt was requested to f/u  With [facility]- Dr. Clarene Duke, MD On 07/10/2012 at 2:00 pm    [facility] On 07/11/2012. (Followup appointment is with Dr Georjean Mode at 2:30 on Thursday 3/20)

## 2012-08-23 NOTE — BH Assessment (Signed)
BHH Assessment Progress Note      Consulted with Dahlia Byes NP re admission to [facility] from [facility]. She accepted him to the [address] hall.

## 2012-08-24 MED ORDER — NAPROXEN 500 MG PO TABS
500.0000 mg | ORAL_TABLET | Freq: Two times a day (BID) | ORAL | Status: DC | PRN
  Administered 2012-08-24 – 2012-08-29 (×10): 500 mg via ORAL
  Filled 2012-08-24 (×9): qty 1
  Filled 2012-08-24: qty 14
  Filled 2012-08-24: qty 1

## 2012-08-24 NOTE — Progress Notes (Signed)
Patient did not attend AA meeting. 

## 2012-08-24 NOTE — H&P (Signed)
Psychiatric Admission Assessment Adult  Patient Identification:  Edward [name]  Date of Evaluation:  08/24/2012  Chief Complaint:  ETOH DEPENDENCE COCAINE DEPENDENCE MDD  History of Present Illness: This is a 52 year old Caucasian male. Patient have had frequent numerous inpatient admissions in this hospital. He is currently receiving psychiatric treatment on outpatient basis at [facility]. Patient reports, "I never did receive the ECT treatment that I was promised when I was discharged from this hospital last time. The doctor that was suppose to administer the ECT to me operates in [place], [place]. This doctor never followed up with me. He never returned any of my phone calls. I am not getting any better. I need to be sent to the [facility]. I'm mentally sick. I don't care how long I stayed [facility] the state hospital, I just want to get there. I don't want to be around other people. It got to a point where I'm so depressed that I could not get out of bed. I have stopped all my medicines. I'm hearing all these women voices telling me to die because I worth nothing. I did not attempt suicide, but afraid I might do it if I don't get help. I even started drinking again, and I used cocaine and smoked week recently. I just want to feel better".  Elements:  Location:  [facility] adult unit. Quality:  Self isoaltion, irritable mood, SI. Severity: Rated depression at #10. Timing:  Started 3 weeks ago. Duration:"Depressed most of my life". Context:  Self Isolation, Irritability, suicide thoughts with plans, stopped all my medicines.  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 He appears well-developed and well-nourished.  HENT:  Head: Normocephalic and atraumatic.  Eyes: Conjunctivae are normal. Pupils are equal, round, and reactive to light.  Neck: Normal range of motion.  Cardiovascular: Normal rate.   Respiratory: Effort normal.  GI: Soft.  Musculoskeletal: Normal range of motion.  Neurological: He is alert and oriented to person, place, and time.  Skin: Skin is warm and dry.  Psychiatric: His speech is normal. His affect is angry. He is withdrawn. Cognition and memory are normal. He expresses inappropriate judgment. He exhibits a depressed mood. He expresses suicidal ideation. He expresses suicidal plans (planed on overdose).    Review of Systems  Constitutional: Negative.   HENT: Negative.   Eyes: Negative.   Respiratory: Negative.   Cardiovascular: Negative.   Gastrointestinal: Negative.   Genitourinary: Negative.   Musculoskeletal: Positive for joint pain.       Hx left joint pain  Skin: Negative.   Endo/Heme/Allergies: Negative.   Psychiatric/Behavioral: Positive for depression (Rated depression at #10), suicidal ideas, hallucinations and substance abuse. Negative for memory loss. The patient is nervous/anxious and has insomnia.     Blood pressure 108/74, pulse 71, temperature 98.1 F (36.7 C), temperature source Oral, resp. rate 16, height 5' 8.5" (1.74 m), weight 83.462 kg (184 lb).Body mass index is 27.57 kg/(m^2).  General Appearance: Disheveled  Eye Contact::  Poor  Speech:  Clear and Coherent  Volume:  Normal  Mood:  Depressed and angry  Affect:  Flat, restricted  Thought Process:  Coherent and Intact  Orientation:  Full (Time, Place, and Person)  Thought Content:  Hallucinations: Auditory and Rumination  Suicidal Thoughts:  Yes.  without intent/plan  Homicidal Thoughts:  Yes.  without intent/plan  Memory:  Immediate;    Good Recent;   Good Remote;   Fair  Judgement:  Impaired  Insight:  Lacking  Psychomotor Activity:  Normal  Concentration:  Good  Recall:  Fair  Akathisia:  No  Handed:  Right  AIMS (if indicated):     Assets:  Desire for Improvement  Sleep:  Number of Hours: 6.5    Past Psychiatric History: Diagnosis: Major depressive disorder, recurrent episodes, Alcohol abuse, Cocaine abuse, PTSD  Hospitalizations: [facility] numerous times.  Outpatient Care: [facility]  Substance Abuse Care: None reported  Self-Mutilation: None reported  Suicidal Attempts: Denies attempts, admits thoughts.  Violent Behaviors: None reported   Past Medical History:   Past Medical History  Diagnosis Date  . Thyroid disease   . Neuromuscular disorder     pinched nerve  . Hypothyroidism   . Mental disorder   . Depression   . Anxiety and depression   . Major depression   . PTSD (post-traumatic stress disorder)   . Hepatitis     hep C   Cardiac History:  Thyroid disease  Allergies:  No Known Allergies  PTA Medications: Prescriptions prior to admission  Medication Sig Dispense Refill  . ARIPiprazole (ABILIFY) 10 MG tablet Take 1 tablet (10 mg total) by mouth 2 (two) times daily. For psychosis and mood stabilization and depression.  60 tablet  0  . gabapentin (NEURONTIN) 600 MG tablet Take 1 tablet (600 mg total) by mouth 3 (three) times daily. Anxiety and neuropathic pain.  90 tablet  0  . levothyroxine (SYNTHROID, LEVOTHROID) 100 MCG tablet Take 100 mcg by mouth every morning. For thyroid disease.      . mirtazapine (REMERON) 30 MG tablet Take 1 tablet (30 mg total) by mouth at bedtime. For insomnia.  30 tablet  0  . naproxen (NAPROSYN) 500 MG tablet Take 500 mg by mouth 2 (two) times daily as needed (pain). For pain      . Vilazodone HCl (VIIBRYD) 40 MG TABS Take 1 tablet (40 mg total) by mouth daily. For depression.  30 tablet  0    Previous Psychotropic Medications:  Medication/Dose  See medication  lists               Substance Abuse History in the last 12 months:  yes  Consequences of Substance Abuse: Medical Consequences:  Liver damage, Possible death by overdose Legal Consequences:  Arrests, jail time, Loss of driving privilege. Family Consequences:  Family discord, divorce and or separation.  Social History:  reports that he has been smoking Cigarettes.  He has a 1.75 pack-year smoking history. He does not have any smokeless tobacco history on file. He reports that he drinks about 3.0 ounces of alcohol per week. He reports that he uses illicit drugs ("Crack" cocaine, Marijuana, and Cocaine).  Additional Social History: Current Place of Residence: I live in a tent in [place]).   Place of Birth: [place], [place]   Family Members: "My sister and daughter"   Marital Status: Single   Children: 1   Sons: 0   Daughters: 1  Relationships: Single   Education: GED   Educational Problems/Performance: Completed GED   Religious Beliefs/Practices: None reported   History of Abuse (Emotional/Phsycial/Sexual): "It's hard for me to talk about it"  Occupational Experiences: Disabled   [employer] History: Air traffic controller History: None reported   Hobbies/Interests: None reported   Family History: No family history on file.   Family History:  No family history on file.  Results for orders placed during the hospital encounter of 08/22/12 (from the past 72 hour(s))  URINE RAPID DRUG SCREEN (HOSP PERFORMED)     Status: Abnormal   Collection Time    08/22/12 10:02 PM      Result Value Range   Opiates NONE DETECTED  NONE DETECTED   Cocaine POSITIVE (*) NONE DETECTED   Benzodiazepines NONE DETECTED  NONE DETECTED   Amphetamines NONE DETECTED  NONE DETECTED   Tetrahydrocannabinol POSITIVE (*) NONE DETECTED   Barbiturates NONE DETECTED  NONE DETECTED   Comment:            DRUG SCREEN FOR MEDICAL PURPOSES     ONLY.  IF CONFIRMATION IS NEEDED     FOR ANY PURPOSE, NOTIFY LAB      WITHIN 5 DAYS.                LOWEST DETECTABLE LIMITS     FOR URINE DRUG SCREEN     Drug Class       Cutoff (ng/mL)     Amphetamine      1000     Barbiturate      200     Benzodiazepine   200     Tricyclics       300     Opiates          300     Cocaine          300     THC              50  CBC     Status: None   Collection Time    08/22/12 10:04 PM      Result Value Range   WBC 7.3  4.0 - 10.5 K/uL   RBC 4.79  4.22 - 5.81 MIL/uL   Hemoglobin 14.4  13.0 - 17.0 g/dL   HCT 16.1  09.6 - 04.5 %   MCV 83.9  78.0 - 100.0 fL   MCH 30.1  26.0 - 34.0 pg   MCHC 35.8  30.0 - 36.0 g/dL   RDW 40.9  81.1 - 91.4 %   Platelets 185  150 - 400 K/uL  COMPREHENSIVE METABOLIC PANEL     Status: Abnormal   Collection Time    08/22/12 10:04 PM      Result Value Range   Sodium 139  135 - 145 mEq/L   Potassium 3.9  3.5 - 5.1 mEq/L   Chloride 102  96 - 112 mEq/L   CO2 24  19 - 32 mEq/L   Glucose, Bld 95  70 - 99 mg/dL   BUN 14  6 - 23 mg/dL   Creatinine, Ser 7.82  0.50 - 1.35 mg/dL   Calcium 9.1  8.4 - 95.6 mg/dL   Total Protein 7.9  6.0 - 8.3 g/dL   Albumin 3.7  3.5 - 5.2 g/dL   AST 213 (*) 0 - 37 U/L   ALT 298 (*) 0 - 53 U/L   Alkaline Phosphatase 93  39 - 117 U/L   Total Bilirubin 0.5  0.3 - 1.2 mg/dL   GFR calc non Af Amer >90  >90 mL/min   GFR calc Af Amer >90  >90 mL/min   Comment:  The eGFR has been calculated     using the CKD EPI equation.     This calculation has not been     validated in all clinical     situations.     eGFR's persistently     <90 mL/min signify     possible Chronic Kidney Disease.  ETHANOL     Status: Abnormal   Collection Time    08/22/12 10:04 PM      Result Value Range   Alcohol, Ethyl (B) 182 (*) 0 - 11 mg/dL   Comment:            LOWEST DETECTABLE LIMIT FOR     SERUM ALCOHOL IS 11 mg/dL     FOR MEDICAL PURPOSES ONLY   Psychological Evaluations:  Assessment:   AXIS I:  Major depression, PTSD, Cocaine abuse, Alcohol  dependence AXIS II:  Deferred AXIS III:   Past Medical History  Diagnosis Date  . Thyroid disease   . Neuromuscular disorder     pinched nerve  . Hypothyroidism   . Mental disorder   . Depression   . Anxiety and depression   . Major depression   . PTSD (post-traumatic stress disorder)   . Hepatitis     hep C   AXIS IV:  economic problems, housing problems, occupational problems, other psychosocial or environmental problems and Substance absue issues AXIS V:  11-20 some danger of hurting self or others possible OR occasionally fails to maintain minimal personal hygiene OR gross impairment in communication  Treatment Plan/Recommendations: 1. Admit for crisis management and stabilization, estimated length of stay 3-5 days.  2. Medication management to reduce current symptoms to base line and improve the patient's overall level of functioning  3. Treat health problems as indicated.  4. Develop treatment plan to decrease risk of relapse upon discharge and the need for readmission.  5. Psycho-social education regarding relapse prevention and self care.  6. Health care follow up as needed for medical problems.  7. Review, reconcile, and reinstate any pertinent home medications for other health issues where appropriate. 8. Call for consults with hospitalist for any additional specialty patient care services as needed.  Treatment Plan Summary: Daily contact with patient to assess and evaluate symptoms and progress in treatment Medication management  Current Medications:  Current Facility-Administered Medications  Medication Dose Route Frequency Provider Last Rate Last Dose  . acetaminophen (TYLENOL) tablet 650 mg  650 mg Oral Q6H PRN Earney Navy, NP   650 mg at 08/24/12 0827  . alum & mag hydroxide-simeth (MAALOX/MYLANTA) 200-200-20 MG/5ML suspension 30 mL  30 mL Oral Q4H PRN Earney Navy, NP      . ARIPiprazole (ABILIFY) tablet 10 mg  10 mg Oral BID Earney Navy, NP    10 mg at 08/24/12 0824  . diphenhydrAMINE (BENADRYL) capsule 25 mg  25 mg Oral QHS PRN Earney Navy, NP      . gabapentin (NEURONTIN) capsule 600 mg  600 mg Oral TID Earney Navy, NP   600 mg at 08/24/12 0826  . levothyroxine (SYNTHROID, LEVOTHROID) tablet 100 mcg  100 mcg Oral q morning - 10a Earney Navy, NP   100 mcg at 08/24/12 0826  . magnesium hydroxide (MILK OF MAGNESIA) suspension 30 mL  30 mL Oral Daily PRN Earney Navy, NP      . mirtazapine (REMERON) tablet 30 mg  30 mg Oral QHS Earney Navy, NP      .  nicotine (NICODERM CQ - dosed in mg/24 hours) patch 14 mg  14 mg Transdermal Q0600 Earney Navy, NP      . nicotine (NICODERM CQ - dosed in mg/24 hours) patch 21 mg  21 mg Transdermal Daily Earney Navy, NP      . Vilazodone HCl (VIIBRYD) TABS 40 mg  40 mg Oral Daily Earney Navy, NP   40 mg at 08/24/12 0825    Observation Level/Precautions:  15 minute checks  Laboratory:  Reviewed ED lab findings on file  Psychotherapy:  Group sessions  Medications:  See medication lists  Consultations:  As needed  Discharge Concerns:  Safety/maintaining sobriety  Estimated LOS: 5-7 days  Other:     I certify that inpatient services furnished can reasonably be expected to improve the patient's condition.   Armandina Stammer I 5/3/20148:48 AM

## 2012-08-24 NOTE — [facility] Group Notes (Signed)
Pt did not attend self inventory group 
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
## 2012-08-24 NOTE — Progress Notes (Signed)
D   Pt is pleasant and cooperative  He isolates to his room and has not attended groups     He voices a desire to get ECT for his chronic depression but said he has not been able to arrange same   He is depressed and sad  A   Verbal support given  Medications administered and effectiveness monitored   Q 15 min checks R   Pt safe at present

## 2012-08-24 NOTE — [facility] Group Notes (Signed)
[facility] Group Notes: (Clinical Social Work)   08/24/2012      Type of Therapy:  Group Therapy   Participation Level:  Did Not Attend    Danelly Hassinger Grossman-Orr, LCSW 08/24/2012, 11:15 AM      

## 2012-08-24 NOTE — Progress Notes (Signed)
Psychoeducational Group Note  Date:  08/24/2012 Time:  0945 am  Group Topic/Focus:  Identifying Needs:   The focus of this group is to help patients identify their personal needs that have been historically problematic and identify healthy behaviors to address their needs.  Participation Level:  Did Not Attend   Andrena Mews 08/24/2012,2:49 PM

## 2012-08-25 DIAGNOSIS — F1410 Cocaine abuse, uncomplicated: Secondary | ICD-10-CM

## 2012-08-25 DIAGNOSIS — F1994 Other psychoactive substance use, unspecified with psychoactive substance-induced mood disorder: Secondary | ICD-10-CM

## 2012-08-25 DIAGNOSIS — F4310 Post-traumatic stress disorder, unspecified: Secondary | ICD-10-CM

## 2012-08-25 NOTE — Progress Notes (Signed)
Patient ID: Macario Shear, male   DOB: January 26, 1961, 52 y.o.   MRN: [id number] D)  Has spent most of the evening in bed, states pain in his knee is almost a 10, has asked for heat pack, was medicated earlier, but pain level only goes down to about 5.  Remains rather flat and depressed this evening, didn't feel like dealing with the drama on the hall earlier of peer smoking, etc.  Felt was better off resting his knee and removing himself from it.  Continues to have suicidal thoughts, but able to contract for safety.  States just doesn't feel well this evening, resting.  Did have a snack of ice cream, but refused remeron again tonight. A)  Will continue to monitor for safety, continue POC, support. R)  Safe at this time, appreciative.

## 2012-08-25 NOTE — Progress Notes (Addendum)
[facility] MD Progress Note  08/25/2012 5:03 PM Edward [name]  MRN:  [id number]  Subjective: Edward [name] reports that he is still feeling depressed. Questioned if there is a combination of antidepressants that could help him at least to feel like getting out of bed in the morning. States that all he does is to get up in the morning, eat break fast and go back to bed. Sleeps between 16-20 hours daily. When he makes it out of bed, he will drink and use cocaine. Swore to keep trying and seeking until he can get the ECT treatment. Adds that he is pissed off at the way mental health is being handled by the government. Continue to report suicidal ideations off and on. Denies HI".  Diagnosis:   Axis I: Post Traumatic Stress Disorder, Substance Induced Mood Disorder and Cocaine abuse Axis II: Deferred Axis III:  Past Medical History  Diagnosis Date  . Thyroid disease   . Neuromuscular disorder     pinched nerve  . Hypothyroidism   . Mental disorder   . Depression   . Anxiety and depression   . Major depression   . PTSD (post-traumatic stress disorder)   . Hepatitis     hep C   Axis IV: economic problems, housing problems, occupational problems, other psychosocial or environmental problems and Substance absue issues Axis V: 41-50 serious symptoms  ADL's:  Fairly intact  Sleep: Fair  Appetite:  Fair  Suicidal Ideation:  Plan:  Denies Intent:  Denies Means:  Denies Homicidal Ideation:  Plan:  Denies denies Intent:  Denies Means:  denies AEB (as evidenced by):  Psychiatric Specialty Exam: Review of Systems  Constitutional: Negative.   HENT: Negative.   Eyes: Negative.   Respiratory: Negative.   Cardiovascular: Negative.   Gastrointestinal: Negative.   Genitourinary: Negative.   Musculoskeletal: Negative.   Skin: Negative.        Tear drop tattoo to lower right eye. Several other bodily tattoos to body areas.  Neurological: Negative.   Endo/Heme/Allergies: Negative.    Psychiatric/Behavioral: Positive for depression (Currently being stabilized with medication prior to discharge) and substance abuse (Hx alcohol and cocaine abuse). Negative for suicidal ideas, hallucinations and memory loss. The patient is not nervous/anxious and does not have insomnia.     Blood pressure 112/72, pulse 72, temperature 98.3 F (36.8 C), temperature source Oral, resp. rate 20, height 5' 8.5" (1.74 m), weight 83.462 kg (184 lb).Body mass index is 27.57 kg/(m^2).  General Appearance: Fairly Groomed  Patent attorney::  Fair  Speech:  Clear and Coherent  Volume:  Normal  Mood:  Depressed and Hopeless  Affect:  Flat  Thought Process:  Coherent, Goal Directed and Intact  Orientation:  Full (Time, Place, and Person)  Thought Content:  Rumination  Suicidal Thoughts:  Yes.  without intent/plan, off & on  Homicidal Thoughts:  No, not today  Memory:  Immediate;   Good Recent;   Good Remote;   Good  Judgement:  Fair  Insight:  Fair  Psychomotor Activity:  Normal  Concentration:  Fair  Recall:  Good  Akathisia:  No  Handed:  Right  AIMS (if indicated):     Assets:  Desire for Improvement  Sleep:  Number of Hours: 5.5   Current Medications: Current Facility-Administered Medications  Medication Dose Route Frequency Provider Last Rate Last Dose  . acetaminophen (TYLENOL) tablet 650 mg  650 mg Oral Q6H PRN Earney Navy, NP   650 mg at 08/24/12 0827  . alum &  mag hydroxide-simeth (MAALOX/MYLANTA) 200-200-20 MG/5ML suspension 30 mL  30 mL Oral Q4H PRN Earney Navy, NP      . ARIPiprazole (ABILIFY) tablet 10 mg  10 mg Oral BID Earney Navy, NP   10 mg at 08/25/12 9562  . diphenhydrAMINE (BENADRYL) capsule 25 mg  25 mg Oral QHS PRN Earney Navy, NP      . gabapentin (NEURONTIN) capsule 600 mg  600 mg Oral TID Earney Navy, NP   600 mg at 08/25/12 1200  . levothyroxine (SYNTHROID, LEVOTHROID) tablet 100 mcg  100 mcg Oral q morning - 10a Earney Navy, NP    100 mcg at 08/25/12 0839  . magnesium hydroxide (MILK OF MAGNESIA) suspension 30 mL  30 mL Oral Daily PRN Earney Navy, NP      . mirtazapine (REMERON) tablet 30 mg  30 mg Oral QHS Earney Navy, NP      . naproxen (NAPROSYN) tablet 500 mg  500 mg Oral BID PRN Sanjuana Kava, NP   500 mg at 08/25/12 1118  . Vilazodone HCl (VIIBRYD) TABS 40 mg  40 mg Oral Daily Earney Navy, NP   40 mg at 08/25/12 1308    Lab Results: No results found for this or any previous visit (from the past 48 hour(s)).  Physical Findings: AIMS: Facial and Oral Movements Muscles of Facial Expression: None, normal Lips and Perioral Area: None, normal Jaw: None, normal Tongue: None, normal,Extremity Movements Upper (arms, wrists, hands, fingers): None, normal Lower (legs, knees, ankles, toes): None, normal, Trunk Movements Neck, shoulders, hips: None, normal, Overall Severity Severity of abnormal movements (highest score from questions above): None, normal Patient's awareness of abnormal movements (rate only patient's report): No Awareness, Dental Status Current problems with teeth and/or dentures?: No Does patient usually wear dentures?: No  CIWA:    COWS:     Treatment Plan Summary: Daily contact with patient to assess and evaluate symptoms and progress in treatment Medication management  Plan: Supportive approach/coping skills/relapse prevention. Informed patient that he is currently on 2 combinations of antidepressant therapy. Encouraged out of room, participation in group sessions and application of coping skills when distressed. Will continue to monitor response to/adverse effects of medications in use to assure effectiveness. Continue to monitor mood, behavior and interaction with staff and other patients. Continue current plan of care.  Medical Decision Making Problem Points:  Established problem, stable/improving (1), Review of last therapy session (1) and Review of psycho-social  stressors (1) Data Points:  Review of medication regiment & side effects (2) Review of new medications or change in dosage (2)  I certify that inpatient services furnished can reasonably be expected to improve the patient's condition.   Armandina Stammer I 08/25/2012, 5:03 PM

## 2012-08-25 NOTE — [facility] Group Notes (Signed)
[facility] Group Notes: (Clinical Social Work)   08/25/2012      Type of Therapy:  Group Therapy   Participation Level:  Did Not Attend    Ambrose Mantle, LCSW 08/25/2012, 11:17 AM

## 2012-08-25 NOTE — Progress Notes (Signed)
Patient did attend the evening speaker AA meeting.  

## 2012-08-25 NOTE — H&P (Signed)
Patient was seen and case discussed with physician extender who performed assessment. Reviewed the information documented and agree with the treatment plan.  Neah Sporrer,JANARDHAHA R. 08/25/2012 1:34 PM 

## 2012-08-25 NOTE — Progress Notes (Signed)
Child/Adolescent Psychoeducational Group Note  Date:  08/25/2012 Time:  8:34 PM  Group Topic/Focus:  Goals Group:   The focus of this group is to help patients establish daily goals to achieve during treatment and discuss how the patient can incorporate goal setting into their daily lives to aide in recovery.  Participation Level:  Did Not Attend  Participation Quality:  Didn't Attend  Affect:  Didn't attend  Cognitive:  Didn't attend  Insight:  None  Engagement in Group:  None  Modes of Intervention:  Didn't attend  Additional Comments:  Pt. Stated that with all the drama that went on today he didn't feel like being bothered with the rest of his peers. I stated that it would only frustrate him even more that what he already is.  Terie Purser R 08/25/2012, 8:34 PM

## 2012-08-25 NOTE — BH Assessment (Signed)
Adult Psychosocial Assessment Update  Interdisciplinary Team  Previous [facility] admissions/discharges:  Admissions  Discharges   Date: 3.4.14  Date: 3.17.14  Date: 12.1.13  Date: 12.18.13   Date: 8.5.13 Date: 8.9.13  Date:  Date:   Date:  Date:   Changes since the last Psychosocial Assessment (including adherence to outpatient mental health and/or substance abuse treatment, situational issues contributing to decompensation and/or relapse).  Patient reports he discharged to [facility] and has been there since   Discharge March 17,14. Used crack cocaine on May 1 and reports depression is still   dealing with depression. "I sleep 16 + hours daily." Expresses disappointment that ECT  Physician he was referred to in March has not followed up with treatments for him. Pt  Has been compliant with medication management and follows up with [facility].     Discharge Plan  1. Will you be returning to the same living situation after discharge?  Yes:  No: X If no, what is your plan?    Wants to get out of [place], interested in openings at [facility] [employer] in [place], [place].    2. Would you like a referral for services when you are discharged?  Yes: X If yes, for what services?  No:    Mental Health provided in city he discharges to.       Summary and Recommendations (to be completed by the evaluator)  Patient is 52 YO caucasian male admitted with diagnosis Substance induced mood   disorder. Patient's fourth [facility] admission in 10 months.  Patient reports desire to re-  Locate to [place], [place]. Patient will benefit from crisis stabilization, medication   Evaluation, group therapy and psycho education in addition to case management for   discharge planning.               Signature: [name], 08/25/2012  12

## 2012-08-25 NOTE — Progress Notes (Addendum)
Pt is very depressed this am and stated SI is always in his thoughts but did contract not to kill himself but only because he and his sister made a contract. Pt stated his sister is in [place] but he can not go there as there is a warrant out there for his arrest. Pt has been going to groups but does isolate. He states he would like to find a home. He does rate his depression a 10/10 and hopelessness a 10/10.pt was given 500mg  of naproxsyn for knee pain and warm heat packs. Pt contracted for safety and will continue to be monitored q 15 minutes. Pt states he keeps hearing voices that tell him to kill himself that he is worthless -pt stated he would not harm himself while here.

## 2012-08-25 NOTE — Progress Notes (Signed)
Psychoeducational Group Note  Date:  08/25/2012 Time:  0945 am  Group Topic/Focus:  Making Healthy Choices:   The focus of this group is to help patients identify negative/unhealthy choices they were using prior to admission and identify positive/healthier coping strategies to replace them upon discharge.  Participation Level:  Active  Participation Quality:  Appropriate  Affect:  Depressed  Cognitive:  Alert  Insight:  Improving  Engagement in Group:  Improving  Additional Comments:    Andrena Mews 08/25/2012, 10:29 AM

## 2012-08-26 DIAGNOSIS — F332 Major depressive disorder, recurrent severe without psychotic features: Principal | ICD-10-CM

## 2012-08-26 DIAGNOSIS — F1910 Other psychoactive substance abuse, uncomplicated: Secondary | ICD-10-CM

## 2012-08-26 MED ORDER — MODAFINIL 200 MG PO TABS
200.0000 mg | ORAL_TABLET | Freq: Every day | ORAL | Status: DC
  Administered 2012-08-26 – 2012-08-29 (×4): 200 mg via ORAL
  Filled 2012-08-26 (×4): qty 1

## 2012-08-26 MED ORDER — BUPROPION HCL ER (XL) 150 MG PO TB24
150.0000 mg | ORAL_TABLET | Freq: Every day | ORAL | Status: DC
  Administered 2012-08-27 – 2012-08-29 (×3): 150 mg via ORAL
  Filled 2012-08-26: qty 7
  Filled 2012-08-26 (×4): qty 1

## 2012-08-26 MED ORDER — BUPROPION HCL ER (XL) 150 MG PO TB24: 150.0000 mg | ORAL_TABLET | Freq: Every day | ORAL | Status: DC

## 2012-08-26 NOTE — BHH Group Notes (Signed)
[facility] LCSW Aftercare Discharge Planning Group Note   08/26/2012 8:45 AM  Participation Quality:  Attentive  Mood/Affect:  Flat  Patient was called out to meet with physician   [name]

## 2012-08-26 NOTE — Progress Notes (Signed)
Pt observed in the dayroom watching TV, but staying to himself.  Minimal interaction with peers.  Pt reports he is having minimal withdrawal symptoms, but still has passive SI.  He denies HI/AV.  He presents tonight flat/depressed.  Pt's main complaint is his R knee pain.  Pt requests Naproxen with his HS meds.  He is unsure when he will be discharged, but he wants to find somewhere to go to outside of [place].  He says he is working with the Sports coach on that.  Support and encouragement offered.  Pt makes his needs known to staff.  Safety maintained with q15 minute checks.

## 2012-08-26 NOTE — Progress Notes (Signed)
Pt requested information on Modafinil so given see handout in shadow chart.  Pt voiced understanding.

## 2012-08-26 NOTE — Tx Team (Addendum)
Interdisciplinary Treatment Plan Update (Adult)  Date: 08/26/2012  Time Reviewed: 9:51 AM   Progress in Treatment: Attending groups: Yes Participating in groups: Yes Taking medication as prescribed:  Yes Tolerating medication:  Yes Family/Significant othe contact made: No Patient understands diagnosis: Yes Discussing patient identified problems/goals with staff: Yes Medical problems stabilized or resolved:  Yes Denies suicidal/homicidal ideation: Yes Patient has not harmed self or Others: Yes  New problem(s) identified: None Identified  Discharge Plan or Barriers:  CSW is assessing for appropriate referrals as patient debates whether he will be staying in area or relocating  Additional comments: N/A  Reason for Continuation of Hospitalization: Depression Medication stabilization   Estimated length of stay: 3 days  For review of initial/current patient goals, please see plan of care.  Attendees: Patient:     Family:     Physician:  Geoffery Lyons 08/26/2012 9:51 AM   Nursing:   Roswell Miners, RN 08/26/2012 9:51 AM   Clinical Social Worker Ronda Fairly 08/26/2012 9:51 AM   Other:  Liliane Bade, Community Care Coordinator 08/26/2012 9:51 AM   Other:  Leland Johns Psych Student 08/26/2012 9:51 AM   Other:   Robbie Louis, RN 08/26/2012 9:51 AM   Other:   08/26/2012 9:51 AM    Scribe for Treatment Team:   Carney Bern, LCSWA  08/26/2012 9:51 AM

## 2012-08-26 NOTE — Progress Notes (Signed)
[facility] MD Progress Note  08/26/2012 12:56 PM Edward [name]  MRN:  [id number] Subjective:  Patient was admitted for  Depression and cocaine abuse  And is still still rating his depression 10/10 despite treatment.  He is reporting hypersomnia and he attributes it to taking Remron and his "bad depression"  He stated he has been asking for ECT but has not been able to receive one.  He reports his Viibryd is not effective but also want to continue taking it.  He also reports he has tried "all Antipsychotics for the voices" but none of them seems to relieve or control his auditory hallucination.  He requested his Remron to be discontinued today. Diagnosis:   Axis I: Major Depression, Recurrent severe, Post Traumatic Stress Disorder, Substance Abuse and Substance Induced Mood Disorder Axis II: Deferred Axis III:  Past Medical History  Diagnosis Date  . Thyroid disease   . Neuromuscular disorder     pinched nerve  . Hypothyroidism   . Mental disorder   . Depression   . Anxiety and depression   . Major depression   . PTSD (post-traumatic stress disorder)   . Hepatitis     hep C   Axis IV: economic problems, occupational problems, other psychosocial or environmental problems and problems related to social environment Axis V: 51-60 moderate symptoms  ADL's:  Intact  Sleep: Good  Appetite:  Good  Suicidal Ideation:  Plan:  none Intent:  passive Means:  none Homicidal Ideation:  Plan:  none Intent:  none Means:  none AEB (as evidenced by):  Psychiatric Specialty Exam: Review of Systems  Constitutional: Negative.   HENT: Negative.   Eyes: Negative.   Respiratory: Negative.   Cardiovascular: Negative.   Gastrointestinal: Negative.   Genitourinary: Negative.   Musculoskeletal: Negative.   Skin: Negative.   Neurological: Negative.   Endo/Heme/Allergies: Negative.   Psychiatric/Behavioral: Positive for suicidal ideas (Reports still feeling suicidal but no plans), hallucinations (Reports  auditory hallucination  with "voice telling him he is no good") and substance abuse (Came in for cocaine abuse /detox). Negative for depression (He rates his depression 10/10) and memory loss. The patient is not nervous/anxious and does not have insomnia.     Blood pressure 108/77, pulse 62, temperature 97.3 F (36.3 C), temperature source Oral, resp. rate 20, height 5' 8.5" (1.74 m), weight 83.462 kg (184 lb).Body mass index is 27.57 kg/(m^2).  General Appearance: Casual  Eye Contact::  Fair  Speech:  Clear and Coherent  Volume:  Normal  Mood:  Angry and Depressed  Affect:  Depressed  Thought Process:  Linear and Tangential  Orientation:  Full (Time, Place, and Person)  Thought Content:  Hallucinations: Auditory  Suicidal Thoughts:  Yes.  without intent/plan  Homicidal Thoughts:  No  Memory:  Immediate;   Good Recent;   Good Remote;   Good  Judgement:  Poor  Insight:  Fair  Psychomotor Activity:  Normal  Concentration:  Good  Recall:  Good  Akathisia:  No  Handed:  Right  AIMS (if indicated):     Assets:  Communication Skills Desire for Improvement Physical Health  Sleep:  Number of Hours: 6   Current Medications: Current Facility-Administered Medications  Medication Dose Route Frequency Provider Last Rate Last Dose  . acetaminophen (TYLENOL) tablet 650 mg  650 mg Oral Q6H PRN Earney Navy, NP   650 mg at 08/24/12 0827  . alum & mag hydroxide-simeth (MAALOX/MYLANTA) 200-200-20 MG/5ML suspension 30 mL  30 mL Oral Q4H  PRN Earney Navy, NP      . ARIPiprazole (ABILIFY) tablet 10 mg  10 mg Oral BID Earney Navy, NP   10 mg at 08/26/12 0825  . diphenhydrAMINE (BENADRYL) capsule 25 mg  25 mg Oral QHS PRN Earney Navy, NP      . gabapentin (NEURONTIN) capsule 600 mg  600 mg Oral TID Earney Navy, NP   600 mg at 08/26/12 1159  . levothyroxine (SYNTHROID, LEVOTHROID) tablet 100 mcg  100 mcg Oral q morning - 10a Earney Navy, NP   100 mcg at 08/26/12  0825  . magnesium hydroxide (MILK OF MAGNESIA) suspension 30 mL  30 mL Oral Daily PRN Earney Navy, NP      . mirtazapine (REMERON) tablet 30 mg  30 mg Oral QHS Earney Navy, NP      . naproxen (NAPROSYN) tablet 500 mg  500 mg Oral BID PRN Sanjuana Kava, NP   500 mg at 08/26/12 0826  . Vilazodone HCl (VIIBRYD) TABS 40 mg  40 mg Oral Daily Earney Navy, NP   40 mg at 08/26/12 0825    Lab Results: No results found for this or any previous visit (from the past 48 hour(s)).  Physical Findings: AIMS: Facial and Oral Movements Muscles of Facial Expression: None, normal Lips and Perioral Area: None, normal Jaw: None, normal Tongue: None, normal,Extremity Movements Upper (arms, wrists, hands, fingers): None, normal Lower (legs, knees, ankles, toes): None, normal, Trunk Movements Neck, shoulders, hips: None, normal, Overall Severity Severity of abnormal movements (highest score from questions above): None, normal Patient's awareness of abnormal movements (rate only patient's report): No Awareness, Dental Status Current problems with teeth and/or dentures?: No Does patient usually wear dentures?: No  CIWA:    COWS:     Treatment Plan Summary:  His depression is being managed with antidepressants with the addition of Abilify for auditory hallucination.  He is encouraged to attend group therapy while in the unit.   Daily contact with patient to assess and evaluate symptoms and progress in treatment  Plan:  We will continue monitor and make changes as needed.  Since he is c/o of hypersomnia, Remron will be discontinued.   Will add Wellbutrin XL 150 mg in AM to augment the Viibryd Will add Provigil to help with the energy, the sleepiness Medical Decision Making Problem Points:  Established problem, stable/improving (1) Data Points:  Review and summation of old records (2) Review of new medications or change in dosage (2)  I certify that inpatient services furnished can  reasonably be expected to improve the patient's condition.   Dahlia Byes, C PMHNP-BC 08/26/2012, 12:56 PM

## 2012-08-26 NOTE — Progress Notes (Signed)
Patient ID: Edward [name], male   DOB: [birth year], 52 y.o.   MRN: [id number] He Has been up and about and to part of the groups , medications , meals and to see the Doctor and has been in bed remainder of time. York Spaniel" that he slept 12 to 15 hours a  Day at home because of his depression". Self inventory Depression and hopelessness at 10, no w/d   Symptoms.

## 2012-08-26 NOTE — Progress Notes (Signed)
Pt attended AA group; appeared attentive; affect appropriate.

## 2012-08-26 NOTE — [facility] Group Notes (Signed)
[facility] LCSW Group Therapy  08/26/2012 1:15 PM  Type of Therapy:  Group Therapy 1:15 to 2:30 PM  Participation Level:  Monopolizing  Participation Quality: Attentive and sharing  Affect: Depressed, flat  Cognitive: Appropriate  Insight:  Engaged  Engagement in Therapy:  Engaged  Modes of Intervention:  Limit setting, exploration, reality testing  Summary of Progress/Problems:  Group discussion focused on what patient's see as their own obstacles to recovery.  Patient shares belief that his depression will continue to be difficult to deal with.  Further exploration revealed that patient has lived with depression for years and he often times deals with inability to motivate self.  Patient described weeks worth of involvement in grounds improvement for halfway house and then lost motivation because tools were removed. [name] was able to process  his lack of motivation to his lack of control in using once monthly when he receives his funds.   Edward [name]

## 2012-08-27 DIAGNOSIS — F1420 Cocaine dependence, uncomplicated: Secondary | ICD-10-CM

## 2012-08-27 DIAGNOSIS — F1020 Alcohol dependence, uncomplicated: Secondary | ICD-10-CM

## 2012-08-27 DIAGNOSIS — F339 Major depressive disorder, recurrent, unspecified: Secondary | ICD-10-CM

## 2012-08-27 NOTE — Progress Notes (Signed)
[name] Progress Note  08/27/2012 4:54 PM Edward [name]  MRN:  [id number] Subjective:  Off the Remeron, he was able to sleep. This morning he is more alert. Still endorses persistence of his depression. He is willing to continue to work with medications as does not seem he is going to be able to have ECT soon enough. He states that he starts ruminating about suicide when he cant see a way out as far as getting his depression lifted Diagnosis:  Alcohol cocaine Dependence, Major Depression recurrent  ADL's:  Intact  Sleep: Fair  Appetite:  Fair  Suicidal Ideation:  Plan:  denies Intent:  denies Means:  denies Homicidal Ideation:  Plan:  denies Intent:  denies Means:  denies AEB (as evidenced by):  Psychiatric Specialty Exam: Review of Systems  Constitutional: Negative.   Eyes: Negative.   Respiratory: Negative.   Cardiovascular: Negative.   Gastrointestinal: Negative.   Genitourinary: Negative.   Musculoskeletal: Positive for back pain.  Skin: Negative.   Neurological: Negative.   Endo/Heme/Allergies: Negative.   Psychiatric/Behavioral: Positive for depression and substance abuse. The patient is nervous/anxious.     Blood pressure 133/80, pulse 72, temperature 98.2 F (36.8 C), temperature source Oral, resp. rate 16, height 5' 8.5" (1.74 m), weight 83.462 kg (184 lb).Body mass index is 27.57 kg/(m^2).  General Appearance: Fairly Groomed  Patent attorney::  Fair  Speech:  Clear and Coherent, Slow and not spontaneous  Volume:  Decreased  Mood:  Depressed  Affect:  Restricted  Thought Process:  Coherent and Goal Directed  Orientation:  Full (Time, Place, and Person)  Thought Content:  worries, concerns  Suicidal Thoughts:  Yes.  without intent/plan, intermittent  Homicidal Thoughts:  No  Memory:  Immediate;   Fair Recent;   Fair Remote;   Fair  Judgement:  Fair  Insight:  Present, superficial  Psychomotor Activity:  Decreased  Concentration:  Poor  Recall:  Fair   Akathisia:  No  Handed:  Right  AIMS (if indicated):     Assets:  Desire for Improvement  Sleep:  Number of Hours: 6.75   Current Medications: Current Facility-Administered Medications  Medication Dose Route Frequency Provider Last Rate Last Dose  . acetaminophen (TYLENOL) tablet 650 mg  650 mg Oral Q6H PRN Earney Navy, NP   650 mg at 08/24/12 0827  . alum & mag hydroxide-simeth (MAALOX/MYLANTA) 200-200-20 MG/5ML suspension 30 mL  30 mL Oral Q4H PRN Earney Navy, NP      . ARIPiprazole (ABILIFY) tablet 10 mg  10 mg Oral BID Earney Navy, NP   10 mg at 08/27/12 0818  . buPROPion (WELLBUTRIN XL) 24 hr tablet 150 mg  150 mg Oral Daily Rachael Fee, MD   150 mg at 08/27/12 0818  . diphenhydrAMINE (BENADRYL) capsule 25 mg  25 mg Oral QHS PRN Earney Navy, NP      . gabapentin (NEURONTIN) capsule 600 mg  600 mg Oral TID Earney Navy, NP   600 mg at 08/27/12 1146  . levothyroxine (SYNTHROID, LEVOTHROID) tablet 100 mcg  100 mcg Oral q morning - 10a Earney Navy, NP   100 mcg at 08/27/12 0818  . magnesium hydroxide (MILK OF MAGNESIA) suspension 30 mL  30 mL Oral Daily PRN Earney Navy, NP      . modafinil (PROVIGIL) tablet 200 mg  200 mg Oral Daily Rachael Fee, MD   200 mg at 08/27/12 0819  . naproxen (NAPROSYN) tablet 500  mg  500 mg Oral BID PRN Sanjuana Kava, NP   500 mg at 08/27/12 1610  . Vilazodone HCl (VIIBRYD) TABS 40 mg  40 mg Oral Daily Earney Navy, NP   40 mg at 08/27/12 0818    Lab Results: No results found for this or any previous visit (from the past 48 hour(s)).  Physical Findings: AIMS: Facial and Oral Movements Muscles of Facial Expression: None, normal Lips and Perioral Area: None, normal Jaw: None, normal Tongue: None, normal,Extremity Movements Upper (arms, wrists, hands, fingers): None, normal Lower (legs, knees, ankles, toes): None, normal, Trunk Movements Neck, shoulders, hips: None, normal, Overall Severity Severity  of abnormal movements (highest score from questions above): None, normal Patient's awareness of abnormal movements (rate only patient's report): No Awareness, Dental Status Current problems with teeth and/or dentures?: No Does patient usually wear dentures?: No  CIWA:    COWS:     Treatment Plan Summary: Daily contact with patient to assess and evaluate symptoms and progress in treatment Medication management  Plan: Supportive approach/coping skills/relapse prevention           Will continue the Viibryd and augment with Wellbutrin as well as use the Provigil to                     address the sluggish feeling, the lethargy that comes from depression  Medical Decision Making Problem Points:  Review of psycho-social stressors (1) Data Points:  Review of medication regiment & side effects (2)  I certify that inpatient services furnished can reasonably be expected to improve the patient's condition.   [name] A 08/27/2012, 4:54 PM

## 2012-08-27 NOTE — [facility] Group Notes (Signed)
[facility] LCSW Group Therapy  08/27/2012 1:15 PM  Type of Therapy:  Group Therapy 1:15 to 2:30   Participation Level:  Appropriate  Participation Quality:  Attentive  Affect:  Flat  Cognitive: Alert and oriented  Insight:  None shared  Engagement in Therapy:  Actively listening  Modes of Intervention:  Discussion, education, socialization and support  Summary of Progress/Problems: Patient attended group presentation by staff member of  [employer] [facility] [employer] [facility] ([facility]). Edward [name] was attentive and appropriate during session. The patient chose not to share.   [name]

## 2012-08-27 NOTE — BHH Group Notes (Signed)
[facility] LCSW Aftercare Discharge Planning Group Note   08/27/2012  8:45 AM  Participation Quality:  Appropriate  Mood/Affect:  Depressed and Flat  Depression Rating:  10; other patients report patient was flying high yesterday, Joden acknowledged this to be true  Anxiety Rating:  10  Thoughts of Suicide:  Yes "constant" and reports "this is normal for me" Will you contract for safety?   Yes  Current AVH:  No  Plan for Discharge/Comments:  [facility] in [place] and explore [facility]  Transportation Means: Bus voucher  Supports: [employer] support tream  Dyane Dustman, Julious Payer

## 2012-08-27 NOTE — Progress Notes (Signed)
Patient ID: Edward [name], male   DOB: 1960-09-23, 52 y.o.   MRN: [id number] He has been up and about and to groups interacting with peers and staff.  Says that he is feeling better today, less pain. Continues to have thoughts of SI, contracts for safety here. He has requested and received a prn for c/o rt. knee pain that was effective.

## 2012-08-27 NOTE — BHH Group Notes (Signed)
Adult Psychoeducational Group Note  Date:  08/27/2012 Time:  10:00am Group Topic/Focus:  Recovery Goals:   The focus of this group is to identify appropriate goals for recovery and establish a plan to achieve them.  Participation Level:  Active  Participation Quality:  Appropriate and Attentive  Affect:  Appropriate  Cognitive:  Appropriate  Insight: Appropriate  Engagement in Group:  Engaged  Modes of Intervention:  Support  Additional Comments:  Staff explained to the patient that the purpose of this group is to educate them on recovery, and on setting mid-range to long-term personal goals for their recovery process.  Staff also explained that the group will also address topics including what recovery is, who goes through recovery, the first steps toward recovery, and setting realistic goals for recovery. Patient was asked to identify two areas of their life in which they would like to make a change toward recovery, and set a specific measurable goal to address each change identified. Patient was provided with a homework assignment regarding how this goal impacts their personal recovery. Staff concluded the group by encouraging the patient to take a proactive approach to accomplishing their recovery goals.  Ardelle Park O 08/27/2012, 11:18 AM

## 2012-08-27 NOTE — Progress Notes (Signed)
Recreation Therapy Notes  Date: 05.06.2014 Time: 3:00pm Location: [address] Day Room      Group Topic/Focus: Communication, Journalist, newspaper, Team Building  Participation Level: Minimal  Participation Quality: Appropriate  Affect: Flat  Cognitive: Appropriate     Additional Comments: Activity: Flip Flop ; Explanation: LRT placed a sheet on the floor. Patients were instructed to stand on the sheet. As a group patients were instructed to flip the sheet over without stepping off of the sheet.   Patient stated he has a bad knee and did not want to participate in the group activity. Patient observed peers complete task successfully. Patient contributed to group discussion about the importance of using communication skills, team building skills and problem solving skills outside of hospital to build a support system for recovery.   Marykay Lex Milliana Reddoch, LRT/CTRS  Jearl Klinefelter 08/27/2012 4:44 PM

## 2012-08-28 DIAGNOSIS — F329 Major depressive disorder, single episode, unspecified: Secondary | ICD-10-CM

## 2012-08-28 NOTE — Progress Notes (Signed)
D: Patient cooperative with staff and peers. Patient's affect is appropriate to circumstance and mood is depressed. Patient complained of right knee pain 9/10. He reported on the self inventory sheet that his sleep is fair, appetite/ability to pay attention is good and energy level is low. Patient rated depression and feelings of hopelessness "8".  A: Support and encouragement provided to patient. Scheduled medications administered per MD orders. PRN Naproxen given for pain. Maintain Q15 minute checks for safety.  R: Patient receptive. Passive SI, but contracts for safety. Denies HI and AVH. Patient remains safe.

## 2012-08-28 NOTE — [facility] Group Notes (Signed)
[facility] LCSW Aftercare Discharge Planning Group Note   08/28/2012 8:45 AM  Participation Quality:  Appropriate  Mood/Affect:  Flat  Depression Rating:  8  Anxiety Rating:  0  Thoughts of Suicide:  Yes, off and on which is a constant for patient Will you contract for safety?   Yes  Current AVH:  Yes  Plan for Discharge/Comments:  Patient wishes to go to [facility] in [place].  CSW called and they had an open bed yesterday.  CSW to provide patient with phone number today so that patient may call and confirm  Transportation Means: Bus Voucher  Supports: NA/AA recovery friends, Earnie Larsson, Julious Payer

## 2012-08-28 NOTE — Progress Notes (Signed)
[facility] MD Progress Note  08/28/2012 2:53 PM Edward [name]  MRN:  [id number] Subjective:  Edward [name] is still adjusting to the medications. Has felt episodes of a "drop" in his alertness, energy level. [facility] [place] and work towards getting with Copy. He is willing to give the medications a longer try. Diagnosis:  Alcohol Dependence, Cocaine Abuse, MDD  ADL's:  Intact  Sleep: Fair  Appetite:  Fair  Suicidal Ideation:  Plan:  denies Intent:  denies Means:  denies Homicidal Ideation:  Plan:  denies Intent:  denies Means:  denies AEB (as evidenced by):  Psychiatric Specialty Exam: Review of Systems  Constitutional: Negative.   HENT: Negative.   Eyes: Negative.   Respiratory: Negative.   Cardiovascular: Negative.   Gastrointestinal: Negative.   Genitourinary: Negative.   Musculoskeletal: Positive for back pain and joint pain.  Skin: Negative.   Neurological: Negative.   Endo/Heme/Allergies: Negative.   Psychiatric/Behavioral: Positive for substance abuse. The patient is nervous/anxious.     Blood pressure 107/73, pulse 73, temperature 98.8 F (37.1 C), temperature source Oral, resp. rate 16, height 5' 8.5" (1.74 m), weight 83.462 kg (184 lb).Body mass index is 27.57 kg/(m^2).  General Appearance: Fairly Groomed  Patent attorney::  Fair  Speech:  Clear and Coherent and Slow  Volume:  Normal  Mood:  Anxious and worried  Affect:  Restricted  Thought Process:  Coherent and Goal Directed  Orientation:  Full (Time, Place, and Person)  Thought Content:  worries, concerns  Suicidal Thoughts:  No  Homicidal Thoughts:  No  Memory:  Immediate;   Fair Recent;   Fair Remote;   Fair  Judgement:  Fair  Insight:  Present  Psychomotor Activity:  Normal  Concentration:  Fair  Recall:  Fair  Akathisia:  Yes  Handed:  Right  AIMS (if indicated):     Assets:  Desire for Improvement  Sleep:  Number of Hours: 6.25   Current  Medications: Current Facility-Administered Medications  Medication Dose Route Frequency Provider Last Rate Last Dose  . acetaminophen (TYLENOL) tablet 650 mg  650 mg Oral Q6H PRN Earney Navy, NP   650 mg at 08/24/12 0827  . alum & mag hydroxide-simeth (MAALOX/MYLANTA) 200-200-20 MG/5ML suspension 30 mL  30 mL Oral Q4H PRN Earney Navy, NP      . ARIPiprazole (ABILIFY) tablet 10 mg  10 mg Oral BID Earney Navy, NP   10 mg at 08/28/12 4696  . buPROPion (WELLBUTRIN XL) 24 hr tablet 150 mg  150 mg Oral Daily Rachael Fee, MD   150 mg at 08/28/12 2952  . diphenhydrAMINE (BENADRYL) capsule 25 mg  25 mg Oral QHS PRN Earney Navy, NP      . gabapentin (NEURONTIN) capsule 600 mg  600 mg Oral TID Earney Navy, NP   600 mg at 08/28/12 1151  . levothyroxine (SYNTHROID, LEVOTHROID) tablet 100 mcg  100 mcg Oral q morning - 10a Earney Navy, NP   100 mcg at 08/28/12 0839  . magnesium hydroxide (MILK OF MAGNESIA) suspension 30 mL  30 mL Oral Daily PRN Earney Navy, NP      . modafinil (PROVIGIL) tablet 200 mg  200 mg Oral Daily Rachael Fee, MD   200 mg at 08/28/12 0839  . naproxen (NAPROSYN) tablet 500 mg  500 mg Oral BID PRN Sanjuana Kava, NP   500 mg at 08/28/12 0842  .  Vilazodone HCl (VIIBRYD) TABS 40 mg  40 mg Oral Daily Earney Navy, NP   40 mg at 08/28/12 7829    Lab Results: No results found for this or any previous visit (from the past 48 hour(s)).  Physical Findings: AIMS: Facial and Oral Movements Muscles of Facial Expression: None, normal Lips and Perioral Area: None, normal Jaw: None, normal Tongue: None, normal,Extremity Movements Upper (arms, wrists, hands, fingers): None, normal Lower (legs, knees, ankles, toes): None, normal, Trunk Movements Neck, shoulders, hips: None, normal, Overall Severity Severity of abnormal movements (highest score from questions above): None, normal Incapacitation due to abnormal movements: None,  normal Patient's awareness of abnormal movements (rate only patient's report): No Awareness, Dental Status Current problems with teeth and/or dentures?: No Does patient usually wear dentures?: No  CIWA:    COWS:     Treatment Plan Summary: Daily contact with patient to assess and evaluate symptoms and progress in treatment Medication management  Plan: Supportive approach/coping skills/relapse prevention           Optimize treatment with medications  Medical Decision Making Problem Points:  Review of psycho-social stressors (1) Data Points:  Review of medication regiment & side effects (2)  I certify that inpatient services furnished can reasonably be expected to improve the patient's condition.   Elwyn Klosinski A 08/28/2012, 2:53 PM

## 2012-08-28 NOTE — Progress Notes (Addendum)
Patient ID: Edward [name], male   DOB: [birth year], 52 y.o.   MRN: [id number]  D:  Pt informed the writer that he plans to discharge to [facility]. Stated that he's been sober since his last bhh adm until recently. Stated that resulted in his discharge [facility](?) on 08/22/12. Pt was more engaged during this adm, and states he's ready to do better.   A:  Support and encouragement was offered. 15 min checks continued for safety.  R: Pt remains safe.

## 2012-08-28 NOTE — Progress Notes (Signed)
Patient ID: Edward [name], male   DOB: 06-28-1960, 53 y.o.   MRN: [id number] He has been calm and cooperative and requested no prn medication. He went to group and to supper tonight.  Stated that he was feeling better but was still tired.

## 2012-08-28 NOTE — [facility] Group Notes (Signed)
[facility] LCSW Group Therapy  08/28/2012 1:45 PM  Type of Therapy:  Group Therapy 1:15 to 2:30  Participation Level:  Minimal  Participation Quality:  Attentive  Affect:  Flat  Cognitive:  Oriented  Insight:  Limited  Engagement in Therapy:  Minimal  Modes of Intervention:  Activity, Discussion, Exploration, Socialization and Support  Summary of Progress/Problems: Focus of group discussion today was feelings related to one's diagnosis of substance abuse, after completing a self test and discussing denial, bargaining, anger, depression, and acceptance to which patients related to in varying degrees.  Edward [name] processed how he deals with suicidal ideation as it is an ongoing thought for him, otherwise Edward [name] was quietly attentive.   [name]

## 2012-08-29 MED ORDER — BUPROPION HCL ER (XL) 150 MG PO TB24: 150.0000 mg | ORAL_TABLET | Freq: Every day | ORAL | Status: DC

## 2012-08-29 MED ORDER — MODAFINIL 200 MG PO TABS: 200.0000 mg | ORAL_TABLET | Freq: Every day | ORAL | Status: DC

## 2012-08-29 MED ORDER — NAPROXEN 500 MG PO TABS: 500.0000 mg | ORAL_TABLET | Freq: Two times a day (BID) | ORAL | Status: DC | PRN

## 2012-08-29 MED ORDER — GABAPENTIN 600 MG PO TABS: 600.0000 mg | ORAL_TABLET | Freq: Three times a day (TID) | ORAL | Status: DC

## 2012-08-29 MED ORDER — LEVOTHYROXINE SODIUM 100 MCG PO TABS: 100.0000 ug | ORAL_TABLET | Freq: Every morning | ORAL | Status: DC

## 2012-08-29 MED ORDER — VILAZODONE HCL 40 MG PO TABS: 40.0000 mg | ORAL_TABLET | Freq: Every day | ORAL | Status: DC

## 2012-08-29 MED ORDER — ARIPIPRAZOLE 10 MG PO TABS: 10.0000 mg | ORAL_TABLET | Freq: Two times a day (BID) | ORAL | Status: DC

## 2012-08-29 NOTE — [facility] Suicide Risk Assessment (Signed)
Suicide Risk Assessment  Discharge Assessment     Demographic Factors:  Caucasian  Mental Status Per Nursing Assessment::   On Admission:  Self-harm thoughts  Current Mental Status by Physician: In full contact with reality. Denies suicidal ideas, plans or intent. His mood is euthymic, his affect is appropriate. He feels that the medications are working for him. He is still worried that as in the past, they will quit working for him, but at least right now he is hopefull. He has put the idea of ECT to the side. He knows how to contact the MD who did the assessment for ECT if he was to feel that he needed it. He is committed to abstinence   Loss Factors: Decline in physical health  Historical Factors: NA  Risk Reduction Factors:   wanting to do better  Continued Clinical Symptoms:  Depression:   Comorbid alcohol abuse/dependence Alcohol/Substance Abuse/Dependencies  Cognitive Features That Contribute To Risk: N/A    Suicide Risk:  Minimal: No identifiable suicidal ideation.  Patients presenting with no risk factors but with morbid ruminations; may be classified as minimal risk based on the severity of the depressive symptoms  Discharge Diagnoses:   AXIS I:  Major Depression recurrent, PTSD, Alcohol Dependence, Cocaine Abuse AXIS II:  Deferred AXIS III:   Past Medical History  Diagnosis Date  . Thyroid disease   . Neuromuscular disorder     pinched nerve  . Hypothyroidism   . Mental disorder   . Depression   . Anxiety and depression   . Major depression   . PTSD (post-traumatic stress disorder)   . Hepatitis     hep C   AXIS IV:  housing problems and other psychosocial or environmental problems AXIS V:  61-70 mild symptoms  Plan Of Care/Follow-up recommendations:  Activity:  as tolerated Diet:  regular Will follow up outpatient basis and continue to work on his Recovery Is patient on multiple antipsychotic therapies at discharge:  No   Has Patient had three or  more failed trials of antipsychotic monotherapy by history:  No  Recommended Plan for Multiple Antipsychotic Therapies: N/A   Edwyn Inclan A 08/29/2012, 12:51 PM

## 2012-08-29 NOTE — Progress Notes (Signed)
Patient ID: Edward [name], male   DOB: 12-23-1960, 52 y.o.   MRN: [id number]   D: Pt was pleasant today, brighter than the previous days. However, pt did complain of pain. Discussed his injury with the Clinical research associate. Pt voiced no questions or concerns.  A: Pt given prn ibuporfen. Support and encouragement was offered. 15 min checks continued for safety.   R: Pt remains safe.

## 2012-08-29 NOTE — Progress Notes (Signed)
Discharge Note: Discharge instructions/prescriptions/medication samples given to patient. Patient verbalized understanding of discharge instructions and prescriptions. Returned belongings to patient. Denies SI/HI/AVH. Patient d/c without incident to the lobby. 

## 2012-08-29 NOTE — Discharge Summary (Signed)
Physician Discharge Summary Note  Patient:  Edward [name] is an 52 y.o., male MRN:  [id number] DOB:  1961-02-10 Patient phone:  [phone] (home)  Patient address:   [address] [place] [address],   Date of Admission:  08/23/2012 Date of Discharge: 08/29/2012  Reason for Admission:  Depression with suicidal ideations, alcohol detox  Discharge Diagnoses: Principal Problem:   Major depression Active Problems:   Alcohol dependence   Cocaine abuse   Substance induced mood disorder   PTSD (post-traumatic stress disorder)  Review of Systems  Constitutional: Negative.   HENT: Negative.   Eyes: Negative.   Respiratory: Negative.   Cardiovascular: Negative.   Gastrointestinal: Negative.   Genitourinary: Negative.   Musculoskeletal: Negative.   Skin: Negative.   Neurological: Negative.   Endo/Heme/Allergies: Negative.   Psychiatric/Behavioral: Positive for depression and substance abuse.   Axis Diagnosis:   AXIS I:  Major Depression, Recurrent severe, Substance Abuse and Substance Induced Mood Disorder AXIS II:  Deferred AXIS III:   Past Medical History  Diagnosis Date  . Thyroid disease   . Neuromuscular disorder     pinched nerve  . Hypothyroidism   . Mental disorder   . Depression   . Anxiety and depression   . Major depression   . PTSD (post-traumatic stress disorder)   . Hepatitis     hep C   AXIS IV:  economic problems, other psychosocial or environmental problems, problems related to social environment and problems with primary support group AXIS V:  61-70 mild symptoms  Level of Care:  OP  Hospital Course:  On admission:  [place] was admitted to [facility] from [facility] ED with complaints of suicidal thoughts, 3 straight days of substance abuse. He is requesting detoxification treatment. During this admission assessment, Patient reports, "I can't get rid of my detox symptoms. I was detoxifying from cocaine, alcohol and weed. I used these  substances last Sunday. I was using for 3 days straight. I was here last December for my depression. I was discharged December the 17th. I stayed sober through December 25th, then relapsed and have been using since then. One of the reasons why I use drugs is that when I get my disability checks, that creates and opportunity for me to use because I have the money. Drugs help change my mood. They keep it high, and yet, I stay miserable using because I know it was wrong. When I was discharged from here last time, I was suppose to go to [facility] to continue substance abuse treatment. However, the [facility] people would not let me come because my insurance is from the [facility], but this Floydene Flock was in [place]. But, I did go to [facility] for my medicines, and would like to go back to [place] after discharge. I feel very depressed. My depression today is at #10. I also have been hearing voices. This led to my feeling suicidal and thoughts to cut myself where I can bleed out and die. I feel very safe here as I do not have any plans to hurt me here".  During hospitalization:  Medication managed--His Remeron 30 mg at bedtime for sleep discontinued.  His Abilify 10 mg BID for depression, gabapentin 600 mg TID for neuropathic pain, synthroid 100 mcg daily for hypothyroidism, and Vibryd 40 mg daily for his depression continued.  Wellbutrin 150 mg for depression and Provigil 200 mg for his hypersomnia started.  Jaaziel participated in most group therapy sessions, participation increased  during his stay and coping skills for his substance abuse and mental issues developed.  Kion will go to the IT sales professional in [place], [place].  Patient denied suicidal/homicidal ideations and auditory/visual hallucinations, follow-up appointments encouraged to attend, outside support groups encouraged and information given.  Rxs and one week supply of medications given at discharge.  Tigran is physically and  mentally stable for discharge.  Consults:  None  Significant Diagnostic Studies:  labs: Completed and reviewed, stable  Discharge Vitals:   Blood pressure 120/81, pulse 65, temperature 98.1 F (36.7 C), temperature source Oral, resp. rate 16, height 5' 8.5" (1.74 m), weight 83.462 kg (184 lb). Body mass index is 27.57 kg/(m^2). Lab Results:   No results found for this or any previous visit (from the past 72 hour(s)).  Physical Findings: AIMS: Facial and Oral Movements Muscles of Facial Expression: None, normal Lips and Perioral Area: None, normal Jaw: None, normal Tongue: None, normal,Extremity Movements Upper (arms, wrists, hands, fingers): None, normal Lower (legs, knees, ankles, toes): None, normal, Trunk Movements Neck, shoulders, hips: None, normal, Overall Severity Severity of abnormal movements (highest score from questions above): None, normal Incapacitation due to abnormal movements: None, normal Patient's awareness of abnormal movements (rate only patient's report): No Awareness, Dental Status Current problems with teeth and/or dentures?: No Does patient usually wear dentures?: No  CIWA:    COWS:     Psychiatric Specialty Exam: See Psychiatric Specialty Exam and Suicide Risk Assessment completed by Attending Physician prior to discharge.  Discharge destination:  Home  Is patient on multiple antipsychotic therapies at discharge:  No   Has Patient had three or more failed trials of antipsychotic monotherapy by history:  No Recommended Plan for Multiple Antipsychotic Therapies:  N/A  Discharge Orders   Future Orders Complete By Expires     Activity as tolerated - No restrictions  As directed     Diet - low sodium heart healthy  As directed         Medication List    STOP taking these medications       mirtazapine 30 MG tablet  Commonly known as:  REMERON      TAKE these medications     Indication   ARIPiprazole 10 MG tablet  Commonly known as:  ABILIFY   Take 1 tablet (10 mg total) by mouth 2 (two) times daily. For psychosis and mood stabilization and depression.   Indication:  Manic-Depression     buPROPion 150 MG 24 hr tablet  Commonly known as:  WELLBUTRIN XL  Take 1 tablet (150 mg total) by mouth daily.   Indication:  Major Depressive Disorder     gabapentin 600 MG tablet  Commonly known as:  NEURONTIN  Take 1 tablet (600 mg total) by mouth 3 (three) times daily. Anxiety and neuropathic pain.   Indication:  Neuropathic Pain     levothyroxine 100 MCG tablet  Commonly known as:  SYNTHROID, LEVOTHROID  Take 1 tablet (100 mcg total) by mouth every morning. For thyroid disease.   Indication:  Underactive Thyroid     modafinil 200 MG tablet  Commonly known as:  PROVIGIL  Take 1 tablet (200 mg total) by mouth daily.   Indication:  Recurring Sleep Episodes During the Day, Shift-Work Sleep Disorder     naproxen 500 MG tablet  Commonly known as:  NAPROSYN  Take 1 tablet (500 mg total) by mouth 2 (two) times daily as needed (pain). For pain   Indication:  Mild to Moderate Pain     Vilazodone HCl 40 MG Tabs  Commonly known as:  VIIBRYD  Take 1 tablet (40 mg total) by mouth daily. For depression.   Indication:  Major Depressive Disorder         Follow-up recommendations:  Activity:  As tolerated Diet:  Low-sodium heart healthy diet  Comments:  Patient will continue his care at his scheduled appointment the social worker is establishing and go to the Psychologist, clinical shelter in [place]. Continue to work the relapse prevention plan Total Discharge Time:  Greater than 30 minutes.  SignedNanine Means, PMH-NP 08/29/2012, 10:12 AM

## 2012-08-30 NOTE — Progress Notes (Signed)
[facility] Adult Case Management Discharge Plan :  Will you be returning to the same living situation after discharge: No, Patient going to men's shelter in [place] At discharge, do you have transportation home?: Yes, Bus Vouchers and PART bus fare Do you have the ability to pay for your medications: Yes, through [facility]  Release of information consent forms completed and in the chart;  Patient's signature needed at discharge.  Patient to Follow up at: Follow-up Information   Follow up with [facility] On 09/09/2012. (Appointment with Dr Francesco Sor on Monday May 19th; meanwhile ACT Team will follow up with you via telephone)    Contact information:   [address] [place], [place] [address] [facility] [phone] Valinda Hoar [phone]      Patient denies SI/HI:  Patient reports   Safety Planning and Suicide Prevention discussed: Yes with patient on 08/29/12 before discharge  [name] 08/30/2012, 11:19 AM

## 2012-08-30 NOTE — [facility] Group Notes (Signed)
[facility] LCSW Aftercare Discharge Planning Group Note  08/29/12   Participation Quality:  Appropriate  Mood/Affect:  Flat  Depression Rating:  5  Anxiety Rating:  0  Thoughts of Suicide:  Off and on which is bseline for patient per self report Will you contract for safety?  "Certainly"  Current AVH:  No  Plan for Discharge/Comments:  Discharge to shelter in either [facility] or [place] and follow up with LME in that area  Transportation Means: Horticulturist, commercial and PART bus fare  Supports: [employer] Team, thus it will be ideal if patient can stay in [facility] Edward [name]

## 2012-08-30 NOTE — BHH Suicide Risk Assessment (Signed)
[facility] INPATIENT:  Family/Significant Other Suicide Prevention Education  Suicide Prevention Education:  Patient refuses to provide consent for CSW to contact anyone in support group (states only support is Nurse, children's) and is going to [employer] in [place], thus Clinical research associate provided suicide prevention education directly to patient; conversation included risk factors, warning signs and resources to contact for help. Mobile crisis services explained and patient states he has phone number for Mobile Crisis programmed in his cell phone. 08/29/12 Carney Bern, LCSWA

## 2012-09-03 NOTE — Progress Notes (Signed)
Patient Discharge Instructions:  After Visit Summary (AVS):   Faxed to:  09/03/12 Discharge Summary Note:   Faxed to:  09/03/12 Psychiatric Admission Assessment Note:   Faxed to:  09/03/12 Suicide Risk Assessment - Discharge Assessment:   Faxed to:  09/03/12 Faxed/Sent to the Next Level Care provider:  09/03/12 Faxed to [facility] @ [phone]  Jerelene Redden, 09/03/2012, 3:37 PM

## 2013-02-28 ENCOUNTER — Emergency Department (HOSPITAL_COMMUNITY)
Admission: EM | Admit: 2013-02-28 | Discharge: 2013-03-01 | Disposition: A | Discharge status: Other Acute Inpatient Hospital | Payer: Medicaid Other | Attending: Emergency Medicine | Admitting: Emergency Medicine

## 2013-02-28 ENCOUNTER — Encounter (HOSPITAL_COMMUNITY): Payer: Self-pay | Admitting: Emergency Medicine

## 2013-02-28 DIAGNOSIS — R45851 Suicidal ideations: Secondary | ICD-10-CM | POA: Insufficient documentation

## 2013-02-28 DIAGNOSIS — F332 Major depressive disorder, recurrent severe without psychotic features: Secondary | ICD-10-CM | POA: Insufficient documentation

## 2013-02-28 DIAGNOSIS — F17200 Nicotine dependence, unspecified, uncomplicated: Secondary | ICD-10-CM | POA: Insufficient documentation

## 2013-02-28 DIAGNOSIS — Z79899 Other long term (current) drug therapy: Secondary | ICD-10-CM | POA: Insufficient documentation

## 2013-02-28 DIAGNOSIS — E039 Hypothyroidism, unspecified: Secondary | ICD-10-CM | POA: Insufficient documentation

## 2013-02-28 DIAGNOSIS — F1010 Alcohol abuse, uncomplicated: Secondary | ICD-10-CM | POA: Insufficient documentation

## 2013-02-28 DIAGNOSIS — G709 Myoneural disorder, unspecified: Secondary | ICD-10-CM | POA: Insufficient documentation

## 2013-02-28 DIAGNOSIS — Z8619 Personal history of other infectious and parasitic diseases: Secondary | ICD-10-CM | POA: Insufficient documentation

## 2013-02-28 DIAGNOSIS — F411 Generalized anxiety disorder: Secondary | ICD-10-CM | POA: Insufficient documentation

## 2013-02-28 DIAGNOSIS — F4310 Post-traumatic stress disorder, unspecified: Secondary | ICD-10-CM | POA: Insufficient documentation

## 2013-02-28 DIAGNOSIS — F1410 Cocaine abuse, uncomplicated: Secondary | ICD-10-CM | POA: Insufficient documentation

## 2013-02-28 DIAGNOSIS — S83209A Unspecified tear of unspecified meniscus, current injury, unspecified knee, initial encounter: Secondary | ICD-10-CM

## 2013-02-28 HISTORY — DX: Unspecified tear of unspecified meniscus, current injury, unspecified knee, initial encounter: S83.209A

## 2013-02-28 LAB — CBC WITH DIFFERENTIAL/PLATELET
Basophils Relative: 0 % (ref 0–1)
Eosinophils Absolute: 0.1 10*3/uL (ref 0.0–0.7)
Eosinophils Relative: 1 % (ref 0–5)
Lymphs Abs: 1.4 10*3/uL (ref 0.7–4.0)
MCH: 30.2 pg (ref 26.0–34.0)
MCHC: 34.1 g/dL (ref 30.0–36.0)
MCV: 88.5 fL (ref 78.0–100.0)
Monocytes Relative: 8 % (ref 3–12)
Neutrophils Relative %: 71 % (ref 43–77)
Platelets: 194 10*3/uL (ref 150–400)

## 2013-02-28 LAB — BASIC METABOLIC PANEL
BUN: 30 mg/dL — ABNORMAL HIGH (ref 6–23)
Calcium: 9.8 mg/dL (ref 8.4–10.5)
Chloride: 100 mEq/L (ref 96–112)
GFR calc Af Amer: >90 mL/min (ref >90)
GFR calc non Af Amer: >90 mL/min (ref >90)
Glucose, Bld: 94 mg/dL (ref 70–99)
Potassium: 4.2 mEq/L (ref 3.5–5.1)
Sodium: 135 mEq/L (ref 135–145)

## 2013-02-28 LAB — RAPID URINE DRUG SCREEN, HOSP PERFORMED: Amphetamines: NOT DETECTED

## 2013-02-28 LAB — ETHANOL: Alcohol, Ethyl (B): <11 mg/dL (ref 0–11)

## 2013-02-28 MED ORDER — ZOLPIDEM TARTRATE 5 MG PO TABS
5.0000 mg | ORAL_TABLET | Freq: Every evening | ORAL | Status: DC | PRN
  Administered 2013-02-28: 5 mg via ORAL
  Filled 2013-02-28: qty 1

## 2013-02-28 MED ORDER — NICOTINE 21 MG/24HR TD PT24
21.0000 mg | MEDICATED_PATCH | Freq: Every day | TRANSDERMAL | Status: DC
  Administered 2013-03-01: 21 mg via TRANSDERMAL
  Filled 2013-02-28: qty 1

## 2013-02-28 MED ORDER — LORAZEPAM 1 MG PO TABS
1.0000 mg | ORAL_TABLET | Freq: Three times a day (TID) | ORAL | Status: DC | PRN
  Administered 2013-02-28 – 2013-03-01 (×2): 1 mg via ORAL
  Filled 2013-02-28 (×2): qty 1

## 2013-02-28 MED ORDER — IBUPROFEN 200 MG PO TABS: 600.0000 mg | ORAL_TABLET | Freq: Three times a day (TID) | ORAL | Status: DC | PRN

## 2013-02-28 MED ORDER — ALUM & MAG HYDROXIDE-SIMETH 200-200-20 MG/5ML PO SUSP: 30.0000 mL | ORAL | Status: DC | PRN

## 2013-02-28 MED ORDER — ACETAMINOPHEN 325 MG PO TABS: 650.0000 mg | ORAL_TABLET | ORAL | Status: DC | PRN

## 2013-02-28 MED ORDER — ONDANSETRON HCL 4 MG PO TABS: 4.0000 mg | ORAL_TABLET | Freq: Three times a day (TID) | ORAL | Status: DC | PRN

## 2013-02-28 NOTE — BH Assessment (Signed)
[facility] Assessment Progress Note  At 18:30 I spoke to EDP Pricilla Loveless, MD in anticipation of face-to face assessment; time to be determined.  Doylene Canning, MA  Triage Specialist  02/28/2013 @ 17:01

## 2013-02-28 NOTE — ED Notes (Signed)
Pt reports coming to [place] from [place] today on a bus. He reports having no family here and that he first came to [facility] from [place] two years ago. He refused to give writer reasons as to why he will not seek services at the [facility] hospital but reports they are good ones. Pt reports still being +SI with plan to take all the meds he can get his hands on because he just gave up. He contracts for safety. He reports +AH but say they don't talk to him they just suggest that he'd be better off dead. He admits to using ETOH sometimes but denies need to detox.

## 2013-02-28 NOTE — ED Notes (Addendum)
Triage Specialist in with pt now. He asked Clinical research associate about seizure disorder and but Clinical research associate can find no documentation of seizure d/o.

## 2013-02-28 NOTE — ED Provider Notes (Signed)
CSN: [id number]     Arrival date & time 02/28/13  1343 History   First MD Initiated Contact with Patient 02/28/13 1408     Chief Complaint  Patient presents with  . Medical Clearance   (Consider location/radiation/quality/duration/timing/severity/associated sxs/prior Treatment) The history is provided by the patient.   He was suicidal ideations with plans to overdose on his medications. History of suicide attempt in the past with overdose of pills. Patient denies any ingestions at this time. Does admit to alcohol and cocaine use. No command hallucinations. Denies any somatic complaints of headache, chest pain, abdominal pain. No treatment used prior to arrival. Patient states that he be sure he will harm himself. Past Medical History  Diagnosis Date  . Thyroid disease   . Neuromuscular disorder     pinched nerve  . Hypothyroidism   . Mental disorder   . Depression   . Anxiety and depression   . Major depression   . PTSD (post-traumatic stress disorder)   . Hepatitis     hep C   Past Surgical History  Procedure Laterality Date  . No past surgeries    . Testiclar cyst excision     No family history on file. History  Substance Use Topics  . Smoking status: Current Every Day Smoker -- 0.25 packs/day for 7 years    Types: Cigarettes  . Smokeless tobacco: Not on file  . Alcohol Use: 3.0 oz/week    5 Cans of beer per week     Comment: case beer daily    Review of Systems  All other systems reviewed and are negative.    Allergies  Review of patient's allergies indicates no known allergies.  Home Medications   Current Outpatient Rx  Name  Route  Sig  Dispense  Refill  . ARIPiprazole (ABILIFY) 10 MG tablet   Oral   Take 1 tablet (10 mg total) by mouth 2 (two) times daily. For psychosis and mood stabilization and depression.   60 tablet   0   . gabapentin (NEURONTIN) 600 MG tablet   Oral   Take 1 tablet (600 mg total) by mouth 3 (three) times daily. Anxiety and  neuropathic pain.   90 tablet   0   . GLUCOSAMINE-CHONDROITIN PO   Oral   Take 1 tablet by mouth every morning.         Marland Kitchen ibuprofen (ADVIL,MOTRIN) 200 MG tablet   Oral   Take 400 mg by mouth every 6 (six) hours as needed (For pain.).         Marland Kitchen levothyroxine (SYNTHROID, LEVOTHROID) 100 MCG tablet   Oral   Take 1 tablet (100 mcg total) by mouth every morning. For thyroid disease.   30 tablet   0   . naproxen (NAPROSYN) 500 MG tablet   Oral   Take 1 tablet (500 mg total) by mouth 2 (two) times daily as needed (pain). For pain   60 tablet   0   . PARoxetine (PAXIL) 30 MG tablet   Oral   Take 30 mg by mouth every morning.          BP 117/73  Pulse 57  Temp(Src) 98.6 F (37 C) (Oral)  Resp 18  SpO2 96% Physical Exam  Nursing note and vitals reviewed. Constitutional: He is oriented to person, place, and time. He appears well-developed and well-nourished.  Non-toxic appearance. No distress.  HENT:  Head: Normocephalic and atraumatic.  Eyes: Conjunctivae, EOM and lids are normal. Pupils are  equal, round, and reactive to light.  Neck: Normal range of motion. Neck supple. No tracheal deviation present. No mass present.  Cardiovascular: Normal rate, regular rhythm and normal heart sounds.  Exam reveals no gallop.   No murmur heard. Pulmonary/Chest: Effort normal and breath sounds normal. No stridor. No respiratory distress. He has no decreased breath sounds. He has no wheezes. He has no rhonchi. He has no rales.  Abdominal: Soft. Normal appearance and bowel sounds are normal. He exhibits no distension. There is no tenderness. There is no rebound and no CVA tenderness.  Musculoskeletal: Normal range of motion. He exhibits no edema and no tenderness.  Neurological: He is alert and oriented to person, place, and time. He has normal strength. No cranial nerve deficit or sensory deficit. GCS eye subscore is 4. GCS verbal subscore is 5. GCS motor subscore is 6.  Skin: Skin is warm  and dry. No abrasion and no rash noted.  Psychiatric: His speech is normal. His affect is blunt. He is withdrawn. Thought content is not delusional. He expresses suicidal ideation. He expresses no homicidal ideation. He expresses suicidal plans. He expresses no homicidal plans.    ED Course  Procedures (including critical care time) Labs Review Labs Reviewed  ETHANOL  URINE RAPID DRUG SCREEN (HOSP PERFORMED)  CBC WITH DIFFERENTIAL  BASIC METABOLIC PANEL   Imaging Review No results found.  EKG Interpretation   None       MDM  No diagnosis found. Patient to be medically cleared and then seen by the behavior health team    Toy Baker, MD 02/28/13 [phone]

## 2013-02-28 NOTE — ED Notes (Addendum)
Pt homeless, reports that he is having SI x2 weeks. Pt states that he does have a plan to "take all of my medications." Pt has had 2 attempts in the past. Pt has PTSD from combat in the 80's. Pt is A&O and in NAD. Pt has flat affect, but is calm and cooperative

## 2013-02-28 NOTE — ED Notes (Signed)
Per Triage Specialist rep that assessed pt he reported he left a shelter in [place] two weeks ago due to bed bugs. He mentioned nothing of the sort to Clinical research associate during assessment. In fact he stated he left [facility] today. Writer cannot find any previous documentation of any bugs being found in his belongings or pt reporting this to any staff. Writer will do skin assessment on pt now.

## 2013-02-28 NOTE — ED Notes (Signed)
Talked to EDP [place]ston about sleep med pt is requesting; he will put in new order.

## 2013-02-28 NOTE — ED Notes (Signed)
Instructed lab to draw dilantin level between 1939-2000.

## 2013-02-28 NOTE — BH Assessment (Signed)
[facility] Assessment Progress Note  Attempted placement at [facility]; per Claris Che, no beds available.  Spoke to [place] at [facility].  He reports that they have beds.  Information faxed; unable to verify receipt, left message on voice mail.  Doylene Canning, MA Triage Specialist 02/28/2013 @ 23:37

## 2013-02-28 NOTE — ED Notes (Signed)
Pt reports that the bugs were at [facility] in [place] and he left there two weeks ago and went to hotel and came to [place] today. He said he has one bite mark on his neck that's healing but there were several others on his neck. He denies being bitten anywhere else. He states he knows there's no bugs in his belongings because "i know my shit and i'd still be getting bit" Will call AC at [facility] to make aware and see what else if anything needs to be done.

## 2013-02-28 NOTE — Consult Note (Signed)
Disposition: Accept to [facility] pending bed availability. Continue to search for placement.   Agree with plan.  Jacqulyn Cane, M.D.  03/01/2013 11:19 AM

## 2013-02-28 NOTE — ED Notes (Signed)
Spoke to [facility] at [facility] and was told there's nothing else that needs to be done and it doesn't sound like pt has bedbugs at this time.

## 2013-02-28 NOTE — BH Assessment (Addendum)
Assessment Note  Edward [name] is a 52 y.o. single white male.  He presents unaccompanied at [facility] complaining of SI persisting for the past 2 weeks.  Pt cooperates with assessment, but prosody is flat, mostly consisting of single word replies to questions.    Stressors: Pt reports that he has been homeless for the past week after leaving the [facility] mission in [place] due to problems with bug bites at the facility.  He reports problems with sleep, and complains of pain in his right knee that he attributes to a torn meniscus, for which he wears a hinged knee brace.  He denies having any other immediate problems, but acknowledges little contact with family.  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  Lethality: Suicidality: Pt reports SI with plan to overdose.  He reports a history of two suicide attempts by overdose, one in 2011 and one in 2012.  He reports that he has not acted on these thought this time because of a promise that he made to his sister.  It should be noted that he has minimal interaction with family members.  He denies any history of self mutilation.  Pt endorses depressed mood with symptoms noted in the "risk to self" assessment below. Homicidality: Pt denies any recent homicidal thoughts or physical aggression.  He reports that "every once in a while" he will have HI toward people that greatly irritate him, but he does not act on these thoughts.  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  Pt denies having access to firearms.  Pt denies having any legal problems at this time.  Pt is calm and cooperative during assessment. Psychosis: Pt denies any recent problems with hallucinations.  He reports that in the past, when greatly stressed he has experienced AH with denigrating content, telling him that he would be better off dead, or that God hates him.  However, he has not experienced these in about 1  year.  Pt does not appear to be responding to internal stimuli and exhibits no delusional thought.  Pt's reality testing appears to be intact.  He reports a past history of flashbacks from combat induced PTSD, but these are no longer a problem.  However, he does have intrusive nightmares that exacerbate his sleep problems. Substance Abuse: Pt reports using several substances.  For the past week-and-a-half he has been using about $80 worth of crack cocaine, about 2 - 3 times a week, with most recent use of $80 worth on 02/26/2013.  Occasionally over the past year he has been smoking a joint of cannabis, with most recent use on 02/26/2013.  Over this same period he has also occasionally been drinking 4 - 5 quarts of beer, with most recent use on 02/26/2013.  Today pt does not appear to be intoxicated or in withdrawal.  Social Supports: When asked about social supports, pt replies, "Jesus."  When asked about his pledge to his sister not to attempt to take his life, he maintains that he has little communication with her.  Pt reports involvement with 12-Step meetings as recently as 2 weeks ago.  He denies having financial problems, citing disability benefits for economic support, but he remains homeless.  Treatment History: Pt reports that he has been hospitalized at a number of facilities in [place] in the past, as well as at [facility].  The EPIC record shows  a history of 4 admissions to [facility], in 08/2012, 06/2012, 03/2012 and 11/2011.  He acknowledges poor follow-up with outpatient referrals, but is willing to follow up with [facility] at this time.  He refuses to have any involvement with the [place] system without offering any explanation.  Today the pt is agreeable to being admitted to [facility] for safety.  Axis I: Mood Disorder NOS 296.90; Posttraumatic Stress Disorder 309.81; Cocaine Abuse 305.60 Axis II: Deferred 799.9 Axis III:  Past Medical History  Diagnosis Date  . Thyroid disease   . Hypothyroidism   . Mental  disorder   . Depression   . Anxiety and depression   . Major depression   . PTSD (post-traumatic stress disorder)   . Hepatitis     hep C  . Meniscus tear 02/28/2013    Right side  . Neuromuscular disorder     pinched nerve (on 02/28/13 pt denies having this problem)   Axis IV: housing problems, problems with access to health care services, problems with primary support group and problems related to non-compliance with treatment Axis V: GAF = 35  Past Medical History:  Past Medical History  Diagnosis Date  . Thyroid disease   . Hypothyroidism   . Mental disorder   . Depression   . Anxiety and depression   . Major depression   . PTSD (post-traumatic stress disorder)   . Hepatitis     hep C  . Meniscus tear 02/28/2013    Right side  . Neuromuscular disorder     pinched nerve (on 02/28/13 pt denies having this problem)    Past Surgical History  Procedure Laterality Date  . No past surgeries    . Testiclar cyst excision      Family History: No family history on file.  Social History:  reports that he has been smoking Cigarettes.  He has a 1.75 pack-year smoking history. He does not have any smokeless tobacco history on file. He reports that he drinks about 3.0 ounces of alcohol per week. He reports that he uses illicit drugs ("Crack" cocaine, Marijuana, and Cocaine).  Additional Social History:  Alcohol / Drug Use Pain Medications: Denies Prescriptions: Denies Over the Counter: Denies Longest period of sobriety (when/how long): 10 years: 1987 - 1997 Negative Consequences of Use: Financial;Legal Substance #1 Name of Substance 1: Cocaine (crack) 1 - Age of First Use: 52 y/o 1 - Amount (size/oz): $80 1 - Frequency: 2 - 3 days a week; "I would use it every day if I could." 1 - Duration: 1.5 weeks 1 - Last Use / Amount: $80 on 02/26/2013 Substance #2 Name of Substance 2: Marijuana 2 - Age of First Use: 52 y/o 2 - Amount (size/oz): 1 joint 2 - Frequency: occasionally 2 -  Duration: 1 year 2 - Last Use / Amount: 1 joint  on 02/26/2013 Substance #3 Name of Substance 3: Alcohol (beer) 3 - Age of First Use: 52 y/o 3 - Amount (size/oz): 4 - 5 quarts 3 - Frequency: occasionally 3 - Duration: 1 year 3 - Last Use / Amount: 4- 5 quarts on 02/26/2013  CIWA: CIWA-Ar BP: 115/71 mmHg Pulse Rate: 64 COWS:    Allergies: No Known Allergies  Home Medications:  (Not in a hospital admission)  OB/GYN Status:  No LMP for male patient.  General Assessment Data Location of Assessment: [facility] Assessment Services Is this a Tele or Face-to-Face Assessment?: Face-to-Face Is this an Initial Assessment or a Re-assessment for this encounter?: Initial Assessment Living  Arrangements: Other (Comment) (Homeless) Can pt return to current living arrangement?: Yes Admission Status: Voluntary Is patient capable of signing voluntary admission?: Yes Transfer from: Acute Hospital Referral Source: Other [place])  Medical Screening Exam [facility] Walk-in ONLY) Medical Exam completed: No Reason for MSE not completed: Other: (Medically cleared at [facility])  [facility] Crisis Care Plan Living Arrangements: Other (Comment) (Homeless) Name of Psychiatrist: None Name of Therapist: None  Education Status Is patient currently in school?: No  Risk to self Suicidal Ideation: Yes-Currently Present Suicidal Intent: Yes-Currently Present Is patient at risk for suicide?: Yes Suicidal Plan?: Yes-Currently Present Specify Current Suicidal Plan: Overdose Access to Means: Yes Specify Access to Suicidal Means: Medications What has been your use of drugs/alcohol within the last 12 months?: Crack cocaine, cannabis, alcohol Previous Attempts/Gestures: Yes How many times?: 2 (Overdose in 2011, 2012) Other Self Harm Risks: Did not OD today because of promise to sister, but little ongoing contact. Triggers for Past Attempts: Other (Comment) (Pt shrugs.) Intentional Self Injurious Behavior: None Family Suicide  History: No (Mother - depression) Recent stressful life event(s): Other (Comment) (Homeless x 1 wk after leaving rescue mission) Persecutory voices/beliefs?: No Depression: Yes Depression Symptoms: Despondent;Insomnia;Fatigue;Isolating;Guilt;Loss of interest in usual pleasures;Feeling worthless/self pity;Feeling angry/irritable (Hopelessness) Substance abuse history and/or treatment for substance abuse?: Yes (Crack cocaine, cannabis, alcohol) Suicide prevention information given to non-admitted patients: Not applicable  Risk to Others Homicidal Ideation: No ("Every once in a while;" none recently) Thoughts of Harm to Others: No Current Homicidal Intent: No Current Homicidal Plan: No Access to Homicidal Means: No Identified Victim: None History of harm to others?: Yes (Only in combat (pt is a veteran)) Assessment of Violence: None Noted (None outside of the context of combat.) Violent Behavior Description: Calm/cooperative Does patient have access to weapons?: No (No access to firearms) Criminal Charges Pending?: No Does patient have a court date: No  Psychosis Hallucinations: None noted (None x 1 year; has had AH when stressed.) Delusions: None noted  Mental Status Report Appear/Hygiene: Other (Comment) (Paper scrubs) Eye Contact: Poor Motor Activity: Unremarkable Speech: Soft;Other (Comment) (Somewhat flat prosody.) Level of Consciousness: Alert Mood: Depressed Affect: Blunted Anxiety Level: Minimal (Frequently feels nervous; no recent panic attacks) Thought Processes: Coherent;Relevant Judgement: Impaired Orientation: Person;Place;Time;Situation (Time: oriented except to date; day of week off by 1) Obsessive Compulsive Thoughts/Behaviors: None  Cognitive Functioning Concentration: Decreased Memory: Recent Intact;Remote Intact IQ: Average Insight: Fair Impulse Control: Good Appetite: Poor (Diminished for a couple of weeks) Weight Loss: 0 Weight Gain: 0 Sleep:  Decreased Total Hours of Sleep: 0 (None for 2 days; ongoing problem) Vegetative Symptoms: Not bathing;Decreased grooming  ADLScreening [facility] Assessment Services) Patient's cognitive ability adequate to safely complete daily activities?: Yes Patient able to express need for assistance with ADLs?: Yes Independently performs ADLs?: Yes (appropriate for developmental age)  Prior Inpatient Therapy Prior Inpatient Therapy: Yes Prior Therapy Dates: 08/2012; 06/2012; 03/2012; 11/2011: [facility] Prior Therapy Facilty/Provider(s): Many past admissions to facilities in [place] Reason for Treatment: Pt refuses treatment through the [place] system  Prior Outpatient Therapy Prior Outpatient Therapy: Yes Prior Therapy Dates: Pt denies, but record show Hx of treatment through [facility] Prior Therapy Facilty/Provider(s): Pt has attended 12-Step meetings as recently as 2 weeks ago. Reason for Treatment: Pt refuses treatment through the [place] system  ADL Screening (condition at time of admission) Patient's cognitive ability adequate to safely complete daily activities?: Yes Is the patient deaf or have difficulty hearing?: No Does the patient have difficulty seeing, even when wearing  glasses/contacts?: No Does the patient have difficulty concentrating, remembering, or making decisions?: No Patient able to express need for assistance with ADLs?: Yes Does the patient have difficulty dressing or bathing?: No Independently performs ADLs?: Yes (appropriate for developmental age) Does the patient have difficulty walking or climbing stairs?: Yes (Torn meniscus, right side, wears hinged knee brace) Weakness of Legs: Right Weakness of Arms/Hands: None  Home Assistive Devices/Equipment Home Assistive Devices/Equipment: Eyeglasses;Brace (specify type) (Hinged knee brace, right side)    Abuse/Neglect Assessment (Assessment to be complete while patient is alone) Physical Abuse: Denies Verbal Abuse: Denies Sexual Abuse:  Denies Exploitation of patient/patient's resources: Denies Self-Neglect: Denies Values / Beliefs Cultural Requests During Hospitalization: None Spiritual Requests During Hospitalization: None   Advance Directives (For Healthcare) Advance Directive: Patient does not have advance directive;Patient would not like information Pre-existing out of facility DNR order (yellow form or pink MOST form): No Nutrition Screen- [facility] Adult/[facility]/AP Patient's home diet: Regular  Additional Information 1:1 In Past 12 Months?: No CIRT Risk: No Elopement Risk: No Does patient have medical clearance?: Yes     Disposition:  Disposition Initial Assessment Completed for this Encounter: Yes Disposition of Patient: Inpatient treatment program Type of inpatient treatment program: Adult (Pending 500 hall bed; will seek outside placement for now.) Per Earl Lites, RN, Administrative Coordinator, pt has been reviewed with Alberteen Sam, NP, who agrees to accept him to [facility] pending availability of a bed on the 500 hall.  Currently no suitable beds are available.  In the meantime, placement is to be sought at alternative facilities.  At 22:11 I spoke to EDP Pricilla Loveless, MD who agrees to this plan.  On Site Evaluation by:   Reviewed with Physician:  Alberteen Sam, NP, per Earl Lites, RN, Administrative Coordinator  Doylene Canning, MA Triage Specialist Raphael Gibney 02/28/2013 10:14 PM

## 2013-03-01 ENCOUNTER — Inpatient Hospital Stay (HOSPITAL_COMMUNITY)
Admission: AD | Admit: 2013-03-01 | Discharge: 2013-03-07 | DRG: 885 | Disposition: A | Discharge status: Home / Self Care | Payer: Medicaid Other | Source: Intra-hospital | Attending: Psychiatry | Admitting: Psychiatry

## 2013-03-01 ENCOUNTER — Encounter (HOSPITAL_COMMUNITY): Payer: Self-pay

## 2013-03-01 DIAGNOSIS — Z79899 Other long term (current) drug therapy: Secondary | ICD-10-CM

## 2013-03-01 DIAGNOSIS — Z5900 Homelessness unspecified: Secondary | ICD-10-CM

## 2013-03-01 DIAGNOSIS — F1410 Cocaine abuse, uncomplicated: Secondary | ICD-10-CM | POA: Diagnosis present

## 2013-03-01 DIAGNOSIS — F332 Major depressive disorder, recurrent severe without psychotic features: Principal | ICD-10-CM | POA: Diagnosis present

## 2013-03-01 DIAGNOSIS — F1020 Alcohol dependence, uncomplicated: Secondary | ICD-10-CM | POA: Diagnosis present

## 2013-03-01 DIAGNOSIS — F329 Major depressive disorder, single episode, unspecified: Secondary | ICD-10-CM | POA: Diagnosis present

## 2013-03-01 DIAGNOSIS — F1994 Other psychoactive substance use, unspecified with psychoactive substance-induced mood disorder: Secondary | ICD-10-CM | POA: Diagnosis present

## 2013-03-01 DIAGNOSIS — E039 Hypothyroidism, unspecified: Secondary | ICD-10-CM | POA: Diagnosis present

## 2013-03-01 DIAGNOSIS — R45851 Suicidal ideations: Secondary | ICD-10-CM

## 2013-03-01 DIAGNOSIS — F4310 Post-traumatic stress disorder, unspecified: Secondary | ICD-10-CM | POA: Diagnosis present

## 2013-03-01 MED ORDER — LEVOTHYROXINE SODIUM 100 MCG PO TABS
100.0000 ug | ORAL_TABLET | Freq: Every day | ORAL | Status: DC
  Filled 2013-03-01: qty 1

## 2013-03-01 MED ORDER — LORAZEPAM 1 MG PO TABS
1.0000 mg | ORAL_TABLET | Freq: Three times a day (TID) | ORAL | Status: DC | PRN
  Administered 2013-03-01: 1 mg via ORAL
  Filled 2013-03-01: qty 1

## 2013-03-01 MED ORDER — IBUPROFEN 600 MG PO TABS
600.0000 mg | ORAL_TABLET | Freq: Two times a day (BID) | ORAL | Status: DC
  Administered 2013-03-01 – 2013-03-06 (×10): 600 mg via ORAL
  Filled 2013-03-01 (×16): qty 1

## 2013-03-01 MED ORDER — ZOLPIDEM TARTRATE 5 MG PO TABS
5.0000 mg | ORAL_TABLET | Freq: Every evening | ORAL | Status: DC | PRN
  Administered 2013-03-01 – 2013-03-02 (×2): 5 mg via ORAL
  Filled 2013-03-01 (×2): qty 1

## 2013-03-01 MED ORDER — ALUM & MAG HYDROXIDE-SIMETH 200-200-20 MG/5ML PO SUSP: 30.0000 mL | ORAL | Status: DC | PRN

## 2013-03-01 MED ORDER — MAGNESIUM HYDROXIDE 400 MG/5ML PO SUSP: 30.0000 mL | Freq: Every day | ORAL | Status: DC | PRN

## 2013-03-01 MED ORDER — ACETAMINOPHEN 325 MG PO TABS: 650.0000 mg | ORAL_TABLET | Freq: Four times a day (QID) | ORAL | Status: DC | PRN

## 2013-03-01 MED ORDER — ARIPIPRAZOLE 10 MG PO TABS
10.0000 mg | ORAL_TABLET | Freq: Two times a day (BID) | ORAL | Status: DC
Start: 2013-03-01 — End: 2013-03-07
  Administered 2013-03-01 – 2013-03-07 (×12): 10 mg via ORAL
  Filled 2013-03-01 (×16): qty 1

## 2013-03-01 MED ORDER — NICOTINE 21 MG/24HR TD PT24
21.0000 mg | MEDICATED_PATCH | Freq: Every day | TRANSDERMAL | Status: DC
  Administered 2013-03-02 – 2013-03-07 (×6): 21 mg via TRANSDERMAL
  Filled 2013-03-01 (×7): qty 1

## 2013-03-01 MED ORDER — ONDANSETRON HCL 4 MG PO TABS: 4.0000 mg | ORAL_TABLET | Freq: Three times a day (TID) | ORAL | Status: DC | PRN

## 2013-03-01 MED ORDER — PAROXETINE HCL 20 MG PO TABS
40.0000 mg | ORAL_TABLET | Freq: Every day | ORAL | Status: DC
  Administered 2013-03-01: 40 mg via ORAL
  Filled 2013-03-01: qty 2

## 2013-03-01 MED ORDER — LEVOTHYROXINE SODIUM 100 MCG PO TABS
100.0000 ug | ORAL_TABLET | Freq: Every day | ORAL | Status: DC
  Administered 2013-03-02 – 2013-03-07 (×6): 100 ug via ORAL
  Filled 2013-03-01 (×9): qty 1

## 2013-03-01 MED ORDER — PAROXETINE HCL 20 MG PO TABS
40.0000 mg | ORAL_TABLET | Freq: Every day | ORAL | Status: DC
  Administered 2013-03-02: 40 mg via ORAL
  Filled 2013-03-01 (×2): qty 2

## 2013-03-01 MED ORDER — GABAPENTIN 600 MG PO TABS
600.0000 mg | ORAL_TABLET | Freq: Three times a day (TID) | ORAL | Status: DC
  Administered 2013-03-01 – 2013-03-07 (×18): 600 mg via ORAL
  Filled 2013-03-01 (×21): qty 1

## 2013-03-01 NOTE — Progress Notes (Signed)
Underwriter faxed referrals to the following hospitals with bed availability: 1)[facility]-at request, faxed referral for d/c tomorrow 2)[facility] 3)[facility] and [facility] denied [facility] only  [facility]

## 2013-03-01 NOTE — Consult Note (Signed)
[facility] Face-to-Face Psychiatry Consult   Reason for Consult:  Suicidal ideation Referring Physician:  EDP Edward [name] is an 52 y.o. male.  Assessment: AXIS I:  Major Depression, Recurrent severe AXIS II:  Deferred AXIS III:   Past Medical History  Diagnosis Date  . Thyroid disease   . Hypothyroidism   . Mental disorder   . Depression   . Anxiety and depression   . Major depression   . PTSD (post-traumatic stress disorder)   . Hepatitis     hep C  . Meniscus tear 02/28/2013    Right side  . Neuromuscular disorder     pinched nerve (on 02/28/13 pt denies having this problem)   AXIS IV:  economic problems, housing problems, occupational problems, other psychosocial or environmental problems, problems related to social environment and problems with primary support group AXIS V:  41-50 serious symptoms  Plan:  Recommend psychiatric Inpatient admission when medically cleared.  Subjective:   Edward [name] is a 52 y.o. male patient admitted with Major depressive d/o, recurrent severe.  HPI: Caucasian male presents unaccompanied at [facility] complaining of SI persisting for the past 2 weeks.   He has been a patient in our inpatient unit twice this year and twice last year.  He was treated in September at [facility] for depression.   Patient is homeless and states he lost all of his medications. Pt  Is cooperates with assessment but made no eye contact with this Clinical research associate and Dr Gilmore Laroche.  His answers to every question was monotone; yes or no with flat affect. Pt reports that he has been homeless for the past week after leaving the rescue mission in [place] . He reports problems with sleep, and that he drinks alcohol and uses cocaine to treat his depression and PTSD.  He rates his depression as 10/10, he also states he drinks alcohol and uses cocaine to treat his depression and PTSD.  Patient reports previous suicide attempt by drug overdose and his plan for another suicide is by drug  over dose.  He denies HI/AVH but is unable to contract for safety and we have accepted him for admission.  We will move him to our [facility] unit as soon as bed is available.   HPI Elements:   Location:  [facility]. Quality:  MODERATE, VOICING SI. Severity:  MODERATE. Context:  HOMELESSNESS..  Past Psychiatric History: Past Medical History  Diagnosis Date  . Thyroid disease   . Hypothyroidism   . Mental disorder   . Depression   . Anxiety and depression   . Major depression   . PTSD (post-traumatic stress disorder)   . Hepatitis     hep C  . Meniscus tear 02/28/2013    Right side  . Neuromuscular disorder     pinched nerve (on 02/28/13 pt denies having this problem)    reports that he has been smoking Cigarettes.  He has a 1.75 pack-year smoking history. He does not have any smokeless tobacco history on file. He reports that he drinks about 3.0 ounces of alcohol per week. He reports that he uses illicit drugs ("Crack" cocaine, Marijuana, and Cocaine). No family history on file. Family History Substance Abuse: Yes, Describe: (Sisters: alcohol) Family Supports: No ("Jesus") Living Arrangements: Other (Comment) (Homeless) Can pt return to current living arrangement?: Yes Abuse/Neglect [facility]) Physical Abuse: Denies Verbal Abuse: Denies Sexual Abuse: Denies Allergies:  No Known Allergies  ACT Assessment Complete:  Yes:    Educational Status  Risk to Self: Risk to self Suicidal Ideation: Yes-Currently Present Suicidal Intent: Yes-Currently Present Is patient at risk for suicide?: Yes Suicidal Plan?: Yes-Currently Present Specify Current Suicidal Plan: Overdose Access to Means: Yes Specify Access to Suicidal Means: Medications What has been your use of drugs/alcohol within the last 12 months?: Crack cocaine, cannabis, alcohol Previous Attempts/Gestures: Yes How many times?: 2 (Overdose in 2011, 2012) Other Self Harm Risks: Did not OD today because of promise to sister, but little  ongoing contact. Triggers for Past Attempts: Other (Comment) (Pt shrugs.) Intentional Self Injurious Behavior: None Family Suicide History: No (Mother - depression) Recent stressful life event(s): Other (Comment) (Homeless x 1 wk after leaving rescue mission) Persecutory voices/beliefs?: No Depression: Yes Depression Symptoms: Despondent;Insomnia;Fatigue;Isolating;Guilt;Loss of interest in usual pleasures;Feeling worthless/self pity;Feeling angry/irritable (Hopelessness) Substance abuse history and/or treatment for substance abuse?: Yes (Crack cocaine, cannabis, alcohol) Suicide prevention information given to non-admitted patients: Not applicable  Risk to Others: Risk to Others Homicidal Ideation: No ("Every once in a while;" none recently) Thoughts of Harm to Others: No Current Homicidal Intent: No Current Homicidal Plan: No Access to Homicidal Means: No Identified Victim: None History of harm to others?: Yes (Only in combat (pt is a veteran)) Assessment of Violence: None Noted (None outside of the context of combat.) Violent Behavior Description: Calm/cooperative Does patient have access to weapons?: No (No access to firearms) Criminal Charges Pending?: No Does patient have a court date: No  Abuse: Abuse/Neglect Assessment (Assessment to be complete while patient is alone) Physical Abuse: Denies Verbal Abuse: Denies Sexual Abuse: Denies Exploitation of patient/patient's resources: Denies Self-Neglect: Denies  Prior Inpatient Therapy: Prior Inpatient Therapy Prior Inpatient Therapy: Yes Prior Therapy Dates: 08/2012; 06/2012; 03/2012; 11/2011: [facility] Prior Therapy Facilty/Provider(s): Many past admissions to facilities in [place] Reason for Treatment: Pt refuses treatment through the [place] system  Prior Outpatient Therapy: Prior Outpatient Therapy Prior Outpatient Therapy: Yes Prior Therapy Dates: Pt denies, but record show Hx of treatment through [facility] Prior Therapy  Facilty/Provider(s): Pt has attended 12-Step meetings as recently as 2 weeks ago. Reason for Treatment: Pt refuses treatment through the [place] system  Additional Information: Additional Information 1:1 In Past 12 Months?: No CIRT Risk: No Elopement Risk: No Does patient have medical clearance?: Yes                  Objective: Blood pressure 115/71, pulse 64, temperature 98.4 F (36.9 C), temperature source Oral, resp. rate 18, SpO2 94.00%.There is no weight on file to calculate BMI. Results for orders placed during the hospital encounter of 02/28/13 (from the past 72 hour(s))  ETHANOL     Status: None   Collection Time    02/28/13  2:20 PM      Result Value Range   Alcohol, Ethyl (B) <11  0 - 11 mg/dL   Comment:            LOWEST DETECTABLE LIMIT FOR     SERUM ALCOHOL IS 11 mg/dL     FOR MEDICAL PURPOSES ONLY  CBC WITH DIFFERENTIAL     Status: None   Collection Time    02/28/13  2:20 PM      Result Value Range   WBC 7.0  4.0 - 10.5 K/uL   RBC 4.94  4.22 - 5.81 MIL/uL   Hemoglobin 14.9  13.0 - 17.0 g/dL   HCT 16.1  09.6 - 04.5 %   MCV 88.5  78.0 - 100.0 fL   MCH 30.2  26.0 -  34.0 pg   MCHC 34.1  30.0 - 36.0 g/dL   RDW 40.9  81.1 - 91.4 %   Platelets 194  150 - 400 K/uL   Neutrophils Relative % 71  43 - 77 %   Neutro Abs 5.0  1.7 - 7.7 K/uL   Lymphocytes Relative 19  12 - 46 %   Lymphs Abs 1.4  0.7 - 4.0 K/uL   Monocytes Relative 8  3 - 12 %   Monocytes Absolute 0.6  0.1 - 1.0 K/uL   Eosinophils Relative 1  0 - 5 %   Eosinophils Absolute 0.1  0.0 - 0.7 K/uL   Basophils Relative 0  0 - 1 %   Basophils Absolute 0.0  0.0 - 0.1 K/uL  BASIC METABOLIC PANEL     Status: Abnormal   Collection Time    02/28/13  2:20 PM      Result Value Range   Sodium 135  135 - 145 mEq/L   Potassium 4.2  3.5 - 5.1 mEq/L   Chloride 100  96 - 112 mEq/L   CO2 24  19 - 32 mEq/L   Glucose, Bld 94  70 - 99 mg/dL   BUN 30 (*) 6 - 23 mg/dL   Creatinine, Ser 7.82  0.50 - 1.35 mg/dL    Calcium 9.8  8.4 - 95.6 mg/dL   GFR calc non Af Amer >90  >90 mL/min   GFR calc Af Amer >90  >90 mL/min   Comment: (NOTE)     The eGFR has been calculated using the CKD EPI equation.     This calculation has not been validated in all clinical situations.     eGFR's persistently <90 mL/min signify possible Chronic Kidney     Disease.  URINE RAPID DRUG SCREEN (HOSP PERFORMED)     Status: Abnormal   Collection Time    02/28/13  3:27 PM      Result Value Range   Opiates NONE DETECTED  NONE DETECTED   Cocaine POSITIVE (*) NONE DETECTED   Benzodiazepines NONE DETECTED  NONE DETECTED   Amphetamines NONE DETECTED  NONE DETECTED   Tetrahydrocannabinol POSITIVE (*) NONE DETECTED   Barbiturates NONE DETECTED  NONE DETECTED   Comment:            DRUG SCREEN FOR MEDICAL PURPOSES     ONLY.  IF CONFIRMATION IS NEEDED     FOR ANY PURPOSE, NOTIFY LAB     WITHIN 5 DAYS.                LOWEST DETECTABLE LIMITS     FOR URINE DRUG SCREEN     Drug Class       Cutoff (ng/mL)     Amphetamine      1000     Barbiturate      200     Benzodiazepine   200     Tricyclics       300     Opiates          300     Cocaine          300     THC              50   Labs are reviewed and are pertinent for UDS is positive for cocaine and Marijuana, the rest of the result is unremarkable..  Current Facility-Administered Medications  Medication Dose Route Frequency Provider Last Rate Last Dose  . acetaminophen (TYLENOL) tablet  650 mg  650 mg Oral Q4H PRN Toy Baker, MD      . alum & mag hydroxide-simeth (MAALOX/MYLANTA) 200-200-20 MG/5ML suspension 30 mL  30 mL Oral PRN Toy Baker, MD      . ibuprofen (ADVIL,MOTRIN) tablet 600 mg  600 mg Oral Q8H PRN Toy Baker, MD      . LORazepam (ATIVAN) tablet 1 mg  1 mg Oral Q8H PRN Toy Baker, MD   1 mg at 03/01/13 1104  . nicotine (NICODERM CQ - dosed in mg/24 hours) patch 21 mg  21 mg Transdermal Daily Toy Baker, MD   21 mg at 03/01/13 1104  .  ondansetron (ZOFRAN) tablet 4 mg  4 mg Oral Q8H PRN Toy Baker, MD      . zolpidem [facility]) tablet 5 mg  5 mg Oral QHS PRN Audree Camel, MD   5 mg at 02/28/13 2240   Current Outpatient Prescriptions  Medication Sig Dispense Refill  . ARIPiprazole (ABILIFY) 10 MG tablet Take 1 tablet (10 mg total) by mouth 2 (two) times daily. For psychosis and mood stabilization and depression.  60 tablet  0  . gabapentin (NEURONTIN) 600 MG tablet Take 1 tablet (600 mg total) by mouth 3 (three) times daily. Anxiety and neuropathic pain.  90 tablet  0  . GLUCOSAMINE-CHONDROITIN PO Take 1 tablet by mouth every morning.      Marland Kitchen ibuprofen (ADVIL,MOTRIN) 200 MG tablet Take 400 mg by mouth every 6 (six) hours as needed (For pain.).      Marland Kitchen levothyroxine (SYNTHROID, LEVOTHROID) 100 MCG tablet Take 1 tablet (100 mcg total) by mouth every morning. For thyroid disease.  30 tablet  0  . naproxen (NAPROSYN) 500 MG tablet Take 1 tablet (500 mg total) by mouth 2 (two) times daily as needed (pain). For pain  60 tablet  0  . PARoxetine (PAXIL) 30 MG tablet Take 30 mg by mouth every morning.        Psychiatric Specialty Exam:     Blood pressure 115/71, pulse 64, temperature 98.4 F (36.9 C), temperature source Oral, resp. rate 18, SpO2 94.00%.There is no weight on file to calculate BMI.  General Appearance: Casual  Eye Contact::  None  Speech:  Slow  Volume:  Decreased  Mood:  Depressed, Dysphoric, Hopeless and Worthless  Affect:  Blunt, Depressed and Flat  Thought Process:  Coherent  Orientation:  Full (Time, Place, and Person)  Thought Content:  suicidal  Suicidal Thoughts:  Yes.  with intent/plan  Homicidal Thoughts:  No  Memory:  Immediate;   Good Recent;   Good Remote;   Good  Judgement:  Poor  Insight:  Lacking  Psychomotor Activity:  Normal  Concentration:  Good  Recall:  NA  Akathisia:  NA  Handed:  Right  AIMS (if indicated):     Assets:  Desire for Improvement  Sleep:      Treatment Plan  Summary:  Consult and face to face interview with Dr Gilmore Laroche We will admit patient to our 500 hall unit when bed is available We will resume his home medications as soon as possible He is expected and encouraged to participate in our group and individual therapy. Daily contact with patient to assess and evaluate symptoms and progress in treatment Medication management  Earney Navy   PMHNP-BC 03/01/2013 12:27 PM I have personally seen the patient and agreed with the findings and involved in the treatment plan. Thresa Ross, MD

## 2013-03-01 NOTE — Progress Notes (Signed)
Adult Psychoeducational Group Note  Date:  03/01/2013 Time:  9:14 PM  Group Topic/Focus:  Wrap-Up Group:   The focus of this group is to help patients review their daily goal of treatment and discuss progress on daily workbooks.  Participation Level:  Did Not Attend  Participation Quality:    Affect:    Cognitive:   Insight: Did not attend  Engagement in Group:    Modes of Intervention:  Additional Comments:    Lorin Mercy 03/01/2013, 9:14 PM

## 2013-03-01 NOTE — Consult Note (Signed)
AC asked TTS to have pt signed Voluntary paper work for admission.  SW offered to assist and had pt sign paper work.  Bed to be determined and [facility] will let nurse know when pt can come.

## 2013-03-01 NOTE — ED Notes (Signed)
[facility] here to transport to [facility]. Ambulatory without difficulty. No complaints voiced. Belongings given to [employer] driver.

## 2013-03-01 NOTE — ED Notes (Signed)
[facility] notified of transport need.

## 2013-03-01 NOTE — Progress Notes (Addendum)
D) 52 year old voluntarily admitted Pt to the service of Dr. Javier Glazier. Pt states that he has had thoughts of overdosing on his medications for the last two weeks. Pt was living in [place] at a homeless shelter and the shelter got bed bugs. He had to leave and has not had a place to stay. 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 States that he does not want to go to the [place] for any help. Pt has a knee brace on his right knee due to a patella dislocation that occurred two years ago. Pt states, "I don't want anyone cutting on me. Pt admits to active Suicidal thoughts presently. Pt is morbidly depressed. With a flat affect. UDS was positive for cocaine and THC which he said he used the other day but doesn't use all the time A) Provided with a 1:1 and oriented to the unit. Pt offered dinner and given gatorade. Encouraged to take a bath, and then he requested that his clothing be washed. Verbal agreement made to contact staff should he feel as though he wanted to act on his suicidal thoughts. R) Agrees to the verbal contract. Taking a shower. Remains depressed with a flat affect and depressed mood.

## 2013-03-02 DIAGNOSIS — F332 Major depressive disorder, recurrent severe without psychotic features: Principal | ICD-10-CM

## 2013-03-02 DIAGNOSIS — F1910 Other psychoactive substance abuse, uncomplicated: Secondary | ICD-10-CM

## 2013-03-02 DIAGNOSIS — R45851 Suicidal ideations: Secondary | ICD-10-CM

## 2013-03-02 DIAGNOSIS — F4310 Post-traumatic stress disorder, unspecified: Secondary | ICD-10-CM

## 2013-03-02 DIAGNOSIS — F1994 Other psychoactive substance use, unspecified with psychoactive substance-induced mood disorder: Secondary | ICD-10-CM

## 2013-03-02 MED ORDER — CLONAZEPAM 0.5 MG PO TABS
0.5000 mg | ORAL_TABLET | Freq: Two times a day (BID) | ORAL | Status: DC | PRN
  Administered 2013-03-02 – 2013-03-03 (×3): 0.5 mg via ORAL
  Filled 2013-03-02 (×3): qty 1

## 2013-03-02 MED ORDER — MIRTAZAPINE 15 MG PO TABS
7.5000 mg | ORAL_TABLET | Freq: Every day | ORAL | Status: DC
  Administered 2013-03-02 – 2013-03-06 (×5): 7.5 mg via ORAL
  Filled 2013-03-02 (×6): qty 1

## 2013-03-02 MED ORDER — PAROXETINE HCL 20 MG PO TABS
60.0000 mg | ORAL_TABLET | Freq: Every day | ORAL | Status: DC
  Administered 2013-03-03 – 2013-03-07 (×5): 60 mg via ORAL
  Filled 2013-03-02 (×6): qty 2

## 2013-03-02 NOTE — Progress Notes (Signed)
Patient came to medication window and requested his hs medications. Patient reported that his ativan had been discontinued and was not happy about this. Patient did attend group this evening and participated. Patient reports passive si and verbally contracts for safety. Writer encouraged patient to see how the klonopin works for him even though he is used to [employer]. Patient was receptive. Safety maintained with 15 min checks. Patient compliant with meds and POC.

## 2013-03-02 NOTE — [facility] Suicide Risk Assessment (Signed)
Suicide Risk Assessment  Admission Assessment     Nursing information obtained from:    Demographic factors:    Current Mental Status:    Loss Factors:    Historical Factors:    Risk Reduction Factors:     CLINICAL FACTORS:   Severe Anxiety and/or Agitation Panic Attacks Depression:   Anhedonia Comorbid alcohol abuse/dependence Hopelessness Impulsivity Insomnia Recent sense of peace/wellbeing Severe Alcohol/Substance Abuse/Dependencies More than one psychiatric diagnosis Unstable or Poor Therapeutic Relationship Previous Psychiatric Diagnoses and Treatments Medical Diagnoses and Treatments/Surgeries  COGNITIVE FEATURES THAT CONTRIBUTE TO RISK:  Closed-mindedness Loss of executive function Polarized thinking Thought constriction (tunnel vision)    SUICIDE RISK:   Moderate:  Frequent suicidal ideation with limited intensity, and duration, some specificity in terms of plans, no associated intent, good self-control, limited dysphoria/symptomatology, some risk factors present, and identifiable protective factors, including available and accessible social support.  PLAN OF CARE: Admitted from [facility] for substance induced mood disorder, PTSD and polysubstance abuse. 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   I certify that inpatient services furnished can reasonably be expected to improve the patient's condition.  Selicia Windom,JANARDHAHA R. 03/02/2013, 10:19 AM

## 2013-03-02 NOTE — Progress Notes (Signed)
Adult Psychoeducational Group Note  Date:  03/02/2013 Time:  8:00pm  Group Topic/Focus:  Wrap-Up Group:   The focus of this group is to help patients review their daily goal of treatment and discuss progress on daily workbooks.  Participation Level:  Active  Participation Quality:  Appropriate and Attentive  Affect:  Appropriate  Cognitive:  Alert and Appropriate  Insight: Appropriate  Engagement in Group:  Engaged  Modes of Intervention:  Discussion and Education  Additional Comments:  Wrap up group we discussed 15 minute checks,enviromental checks, falls precautions and each patient checked in on how their day went. Pt stated he is feeling depressed. He states he feels anxious and nervousness all the time.   Shelly Bombard D 03/02/2013, 10:20 PM

## 2013-03-02 NOTE — Progress Notes (Signed)
Adult Psychoeducational Group Note  Date:  03/02/2013 Time:  3:49 PM  Group Topic/Focus:  Therapeutic Activity  Participation Level:  Active  Participation Quality:  Appropriate  Affect:  Appropriate  Cognitive:  Appropriate  Insight: Appropriate  Engagement in Group:  Engaged  Modes of Intervention:  Activity  Additional Comments:  Pts played "Signs of Stress Bingo". Each pt was given a card with different signs and symptoms of stress on them (Loss of appetite, weight loss or gain etc) . Wining pts where rewarded with a piece of candy.   Tora Perches N 03/02/2013, 3:49 PM
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
## 2013-03-02 NOTE — Progress Notes (Signed)
Writer entered patients room and observed him lying in bed and he appeared to be asleep but was aroused when his name was called. When patient turned over to face writer he wiped what appeared to be tears from his face. Patient was informed of medications due since he had not been long admitted to the floor. Patient requested ativan along with scheduled meds. Patient has flat affect and sad. Writer allowed patient to continue to rest. Safety maintained on unit with 15 min checks

## 2013-03-02 NOTE — Progress Notes (Signed)
Psychoeducational Group Note  Date:  03/02/2013 Time:  1015  Group Topic/Focus:  Making Healthy Choices:   The focus of this group is to help patients identify negative/unhealthy choices they were using prior to admission and identify positive/healthier coping strategies to replace them upon discharge.  Participation Level:  Active  Participation Quality:  Appropriate  Affect:  Appropriate  Cognitive:  Oriented  Insight:  Improving  Engagement in Group:  Engaged  Additional Comments:    Sparrow Sanzo A 03/02/2013 

## 2013-03-02 NOTE — [facility] Group Notes (Signed)
[facility] Group Notes: (Clinical Social Work)   03/02/2013      Type of Therapy:  Group Therapy   Participation Level:  Did Not Attend    Ambrose Mantle, LCSW 03/02/2013, 4:12 PM

## 2013-03-02 NOTE — Progress Notes (Signed)
Edward [name] is depressed, disengaged and irritable, when he doesn't get his way. He got very agitated when the MD DC'Edward his order for ativan and spent 3 hours pacing back and forth until he could convince MD to order klonopin, which he did.    A He was assisted to shave and then his dirty clothes were washed and dried. He is scheduled to be started on remeron 7.5 mg tonight, to aide in sleep. He will not complete his self inventory. He denies active SI.   R Safety is in place. Pt is not engaged in his recovery and poc maintaiend.

## 2013-03-03 MED ORDER — ZOLPIDEM TARTRATE 5 MG PO TABS
5.0000 mg | ORAL_TABLET | Freq: Every evening | ORAL | Status: DC | PRN
  Administered 2013-03-04: 5 mg via ORAL
  Filled 2013-03-03: qty 1

## 2013-03-03 MED ORDER — ZOLPIDEM TARTRATE 5 MG PO TABS: 5.0000 mg | ORAL_TABLET | Freq: Every evening | ORAL | Status: DC | PRN

## 2013-03-03 MED ORDER — HYDROXYZINE HCL 50 MG PO TABS
50.0000 mg | ORAL_TABLET | Freq: Once | ORAL | Status: DC
  Filled 2013-03-03: qty 1

## 2013-03-03 MED ORDER — ENSURE COMPLETE PO LIQD
237.0000 mL | Freq: Two times a day (BID) | ORAL | Status: DC
  Administered 2013-03-03 – 2013-03-06 (×7): 237 mL via ORAL

## 2013-03-03 MED ORDER — ADULT MULTIVITAMIN W/MINERALS CH
1.0000 | ORAL_TABLET | Freq: Every day | ORAL | Status: DC
  Administered 2013-03-03 – 2013-03-07 (×5): 1 via ORAL
  Filled 2013-03-03 (×7): qty 1

## 2013-03-03 MED ORDER — HALOPERIDOL LACTATE 5 MG/ML IJ SOLN
5.0000 mg | Freq: Once | INTRAMUSCULAR | Status: AC
  Administered 2013-03-03: 5 mg via INTRAMUSCULAR
  Filled 2013-03-03 (×2): qty 1

## 2013-03-03 NOTE — [facility] Suicide Risk Assessment (Signed)
[facility] Adult Inpatient Family/Significant Other Suicide Prevention Education  Suicide Prevention Education:   Patient Refusal for Family/Significant Other Suicide Prevention Education: The patient has refused to provide written consent for family/significant other to be provided Family/Significant Other Suicide Prevention Education during admission and/or prior to discharge.  Physician notified.  CSW provided suicide prevention information with patient.    The suicide prevention education provided includes the following:  Suicide risk factors  Suicide prevention and interventions  National Suicide Hotline telephone number  [facility] assessment telephone number  [facility] Emergency Assistance 911  [facility] and/or Residential Mobile Crisis Unit telephone number   Edward [name], [place] 03/03/2013 2:31 PM

## 2013-03-03 NOTE — Tx Team (Signed)
Interdisciplinary Treatment Plan Update (Adult)  Date: 03/03/2013  Time Reviewed:  9:45 AM  Progress in Treatment: Attending groups: Yes Participating in groups:  Yes Taking medication as prescribed:  Yes Tolerating medication:  Yes Family/Significant othe contact made: CSW assessing  Patient understands diagnosis:  Yes Discussing patient identified problems/goals with staff:  Yes Medical problems stabilized or resolved:  Yes Denies suicidal/homicidal ideation: Yes Issues/concerns per patient self-inventory:  Yes Other:  New problem(s) identified: N/A  Discharge Plan or Barriers: CSW assessing for appropriate referrals.  Reason for Continuation of Hospitalization: Anxiety Depression Medication Stabilization  Comments: N/A  Estimated length of stay: 3-5 days  For review of initial/current patient goals, please see plan of care.  Attendees: Patient:    Family:     Physician:  Dr. Johnalagadda 03/03/2013 9:51 AM   Nursing:   Beverly Knight, RN 03/03/2013 9:51 AM   Clinical Social Worker:  Ivyanna Sibert Horton, LCSW 03/03/2013 9:51 AM   Other: Neil Mashburn, PA 03/03/2013 9:51 AM   Other:  Maseta Dorley, MA care coordination 03/03/2013 9:51 AM   Other:  Quylle Hodnett, LCSW 03/03/2013 9:51 AM   Other:  Christa Dopson, RN 03/03/2013 9:51 AM   Other: Carol Davis, RN 03/03/2013 9:51 AM   Other:    Other:    Other:    Other:      Scribe for Treatment Team:   Horton, Jonnae Fonseca Nicole, 03/03/2013 , 9:51 AM   

## 2013-03-03 NOTE — Progress Notes (Signed)
Recreation Therapy Notes  Date: 11.10.2014 Time: 3:00pm Location: [address] Dayroom  Group Topic: Communication, Team Building, Problem Solving  Goal Area(s) Addresses:  Patient will effectively work with peer towards shared goal.  Patient will identify skill used to make activity successful.  Patient will identify how skills used during activity can be used to reach post d/c goals.   Behavioral Response: Engaged, Attentive, Appropriate  Intervention: Problem Solving Activitiy  Activity: Life Boat. Patients were given a scenario about being on a sinking yacht. Patients were informed the yacht included 15 guest, 8 of which could be placed on the life boat, along with all group members. Individuals on guest list were of varying socioeconomic classes such as a Education officer, museum, Materials engineer, Midwife, Tree surgeon.   Education: Customer service manager, Discharge Planning   Education Outcome: Acknowledges understanding  Clinical Observations/Feedback: Patient arrived late to group session, upon arrival patient actively engaged in group session. Patient actively engaged in group activity, voicing his opinion and debating with peers appropriately. Patient contributed to group discussion, defining wellness for group, while patient did not verbally relate his definition of wellness to group skills he appeared to intently listen as LRT and group members discussion concepts relating to wellness.    Marykay Lex Shavon Zenz, LRT/CTRS  Jearl Klinefelter 03/03/2013 3:55 PM

## 2013-03-03 NOTE — Progress Notes (Signed)
[facility] MD Progress Note  03/03/2013 5:36 PM Tait Balistreri  MRN:  [id number]  Subjective:  Met with patient 1:1 today. He says that "I just want to drink and drug until I"m dead. I want to smoke a rock of crack big enough to cause me to have a heart attack and die."  Objective: Patient is invested in making sure that his depression is severe and clear to this Clinical research associate. He has a constricted affect. He ambulates easily and is much more animated physically than his reported level of depression would suggest. His eye contact is good and his speech is spontaneous and goal directed. He is sarcastic in his comments.   Diagnosis:   DSM5:  Schizophrenia Disorders:  Obsessive-Compulsive Disorders:  Trauma-Stressor Disorders: Posttraumatic Stress Disorder (309.81)  Substance/Addictive Disorders: Alcohol Related Disorder - Severe (303.90) and Cannabis Use Disorder - Moderate 9304.30)  Depressive Disorders: Major Depressive Disorder - Severe (296.23)  AXIS I: Major Depression, Recurrent severe, Substance Induced Mood Disorder and Polysubstance abuse and PTSD  AXIS II: Deferred  AXIS III:  Past Medical History   Diagnosis  Date   .  Thyroid disease    .  Hypothyroidism    .  Mental disorder    .  Depression    .  Anxiety and depression    .  Major depression    .  PTSD (post-traumatic stress disorder)    .  Hepatitis      hep C   .  Meniscus tear  02/28/2013     Right side   .  Neuromuscular disorder      pinched nerve (on 02/28/13 pt denies having this problem)    AXIS IV: economic problems, housing problems, occupational problems, other psychosocial or environmental problems, problems related to social environment and problems with primary support group  AXIS V: 41-50 serious symptoms   ADL's:  Intact  Sleep: Fair  Appetite:  Good  Suicidal Ideation:  Yes, but can contract for safety. Homicidal Ideation:  denies AEB (as evidenced by):  Psychiatric Specialty Exam: Review of Systems   Constitutional: Negative.  Negative for fever, chills, weight loss, malaise/fatigue and diaphoresis.  HENT: Negative for congestion and sore throat.   Eyes: Negative for blurred vision, double vision and photophobia.  Respiratory: Negative for cough, shortness of breath and wheezing.   Cardiovascular: Negative for chest pain, palpitations and PND.  Gastrointestinal: Negative for heartburn, nausea, vomiting, abdominal pain, diarrhea and constipation.  Musculoskeletal: Negative for falls and myalgias. Joint pain: knee pain.  Neurological: Negative for dizziness, tingling, tremors, sensory change, speech change, focal weakness, seizures, loss of consciousness, weakness and headaches.  Endo/Heme/Allergies: Negative for polydipsia. Does not bruise/bleed easily.  Psychiatric/Behavioral: Negative for depression, suicidal ideas, hallucinations, memory loss and substance abuse. The patient is not nervous/anxious and does not have insomnia.     Blood pressure 91/60, pulse 69, temperature 98 F (36.7 C), temperature source Oral, resp. rate 16, height 5\' 10"  (1.778 m), weight 81.647 kg (180 lb).Body mass index is 25.83 kg/(m^2).  General Appearance: Fairly Groomed  Patent attorney::  Good  Speech:  Clear and Coherent  Volume:  Normal  Mood:  Dysphoric  Affect:  Constricted  Thought Process:  Goal Directed  Orientation:  Full (Time, Place, and Person)  Thought Content:  NA  Suicidal Thoughts:  Yes.  without intent/plan  Homicidal Thoughts:  No  Memory:  Immediate;   Fair  Judgement:  Poor  Insight:  Lacking  Psychomotor Activity:  Normal  Concentration:  Fair  Recall:  Fair  Akathisia:  No  Handed:  Right  AIMS (if indicated):     Assets:  Physical Health  Sleep:  Number of Hours: 6   Current Medications: Current Facility-Administered Medications  Medication Dose Route Frequency Provider Last Rate Last Dose  . acetaminophen (TYLENOL) tablet 650 mg  650 mg Oral Q6H PRN Earney Navy, NP       . alum & mag hydroxide-simeth (MAALOX/MYLANTA) 200-200-20 MG/5ML suspension 30 mL  30 mL Oral Q4H PRN Earney Navy, NP      . ARIPiprazole (ABILIFY) tablet 10 mg  10 mg Oral BID Earney Navy, NP   10 mg at 03/03/13 0857  . clonazePAM (KLONOPIN) tablet 0.5 mg  0.5 mg Oral BID PRN Nehemiah Settle, MD   0.5 mg at 03/03/13 1723  . feeding supplement (ENSURE COMPLETE) (ENSURE COMPLETE) liquid 237 mL  237 mL Oral BID BM Lavena Bullion, RD   237 mL at 03/03/13 1548  . gabapentin (NEURONTIN) tablet 600 mg  600 mg Oral TID Earney Navy, NP   600 mg at 03/03/13 1720  . ibuprofen (ADVIL,MOTRIN) tablet 600 mg  600 mg Oral BID Kristeen Mans, NP   600 mg at 03/03/13 0857  . levothyroxine (SYNTHROID, LEVOTHROID) tablet 100 mcg  100 mcg Oral QAC breakfast Earney Navy, NP   100 mcg at 03/03/13 1610  . magnesium hydroxide (MILK OF MAGNESIA) suspension 30 mL  30 mL Oral Daily PRN Earney Navy, NP      . mirtazapine (REMERON) tablet 7.5 mg  7.5 mg Oral QHS Nehemiah Settle, MD   7.5 mg at 03/02/13 2107  . multivitamin with minerals tablet 1 tablet  1 tablet Oral Daily Lavena Bullion, RD   1 tablet at 03/03/13 1548  . nicotine (NICODERM CQ - dosed in mg/24 hours) patch 21 mg  21 mg Transdermal Daily Earney Navy, NP   21 mg at 03/03/13 0855  . PARoxetine (PAXIL) tablet 60 mg  60 mg Oral Daily Nehemiah Settle, MD   60 mg at 03/03/13 0857  . zolpidem (AMBIEN) tablet 5 mg  5 mg Oral QHS PRN Earney Navy, NP   5 mg at 03/02/13 2109    Lab Results: No results found for this or any previous visit (from the past 48 hour(s)).  Physical Findings: AIMS: Facial and Oral Movements Muscles of Facial Expression: None, normal Lips and Perioral Area: None, normal Jaw: None, normal Tongue: None, normal,Extremity Movements Upper (arms, wrists, hands, fingers): None, normal Lower (legs, knees, ankles, toes): None, normal, Trunk Movements Neck, shoulders,  hips: None, normal, Overall Severity Severity of abnormal movements (highest score from questions above): None, normal Incapacitation due to abnormal movements: None, normal Patient's awareness of abnormal movements (rate only patient's report): No Awareness, Dental Status Current problems with teeth and/or dentures?: No Does patient usually wear dentures?: No  CIWA:    COWS:     Treatment Plan Summary: Daily contact with patient to assess and evaluate symptoms and progress in treatment Medication management  Plan: 1. Continue crisis management and stabilization. 2. Medication management to reduce current symptoms to base line and improve patient's overall level of functioning 3. Treat health problems as indicated. 4. Develop treatment plan to decrease risk of relapse upon discharge and the need for readmission. 5. Psycho-social education regarding relapse prevention and self care. 6. Health care follow up as needed for medical problems.  7. Continue home medications where appropriate. 8. D/C ambien and will use Remeron as that has worked in the past.  9. Will also avoid Benzodiazepines as he has a history of substance abuse.  10. Will recommend Mobic 7.5 mg po qd for knee pain. 11. Recommend ELOS: 2-3 days.  Medical Decision Making Problem Points:  Established problem, stable/improving (1) Data Points:  Review and summation of old records (2)  I certify that inpatient services furnished can reasonably be expected to improve the patient's condition.   Rona Ravens. [place] RPAC 5:58 PM 03/03/2013  Reviewed the information documented and agree with the treatment plan.  Lemario Chaikin,JANARDHAHA R. 03/04/2013 4:27 PM

## 2013-03-03 NOTE — Progress Notes (Signed)
Nutrition Brief Note  Patient identified on the Malnutrition Screening Tool (MST) Report.  Wt Readings from Last 10 Encounters:  03/01/13 180 lb (81.647 kg)  08/23/12 184 lb (83.462 kg)  07/02/12 182 lb 8 oz (82.781 kg)  03/24/12 174 lb (78.926 kg)  11/27/11 172 lb (78.019 kg)   Body mass index is 25.83 kg/(m^2). Patient meets criteria for overweight based on current BMI.   Discussed intake PTA with patient and compared to intake presently.  Discussed changes in intake, if any, and encouraged adequate intake of meals and snacks. Pt admitted with recurrent severe depression. Has been homeless for the past week since leaving rescue mission in [place]. Over the past 1.5 weeks he was using crack cocaine, cannabis, and drinking 4-5 quarts of beer. Met with pt who reports not eating for 5 days PTA. Said he knew where to get food but just didn't want to eat. Reports relatively stable weight. Has been eating > 50% of meals since admission. Requests multivitamin and Ensure.   Current diet order is regular and pt is also offered choice of unit snacks mid-morning and mid-afternoon.  Pt is eating as desired.   Labs and medications reviewed. BUN slightly elevated.   Nutrition Dx: Inadequate oral intake related to depression, drug/alcohol abuse per pt report.   Interventions:   Discussed the importance of nutrition and encouraged intake of food and beverages.     Supplements: Ensure Complete BID   Multivitamin 1 tablet PO daily   No additional nutrition interventions warranted at this time. If nutrition issues arise, please consult RD.   Edward Hedger MS, RD, LDN [phone] Pager [phone] After Hours Pager

## 2013-03-03 NOTE — [facility] Group Notes (Signed)
[facility] LCSW Aftercare Discharge Planning Group Note   03/03/2013  8:45 AM  Participation Quality:  Did Not Attend  Reyes Ivan, LCSW 03/03/2013 9:35 AM

## 2013-03-03 NOTE — [facility] Group Notes (Signed)
[facility] LCSW Group Therapy  03/03/2013  1:15 PM   Type of Therapy:  Group Therapy  Participation Level:  Active  Participation Quality:  Appropriate and Attentive  Affect:  Appropriate, Flat and Depressed  Cognitive:  Alert and Appropriate  Insight:  Developing/Improving and Engaged  Engagement in Therapy:  Developing/Improving and Engaged  Modes of Intervention:  Clarification, Confrontation, Discussion, Education, Exploration, Limit-setting, Orientation, Problem-solving, Rapport Building, Dance movement psychotherapist, Socialization and Support  Summary of Progress/Problems: Pt identified obstacles faced currently and processed barriers involved in overcoming these obstacles. Pt identified steps necessary for overcoming these obstacles and explored motivation (internal and external) for facing these difficulties head on. Pt further identified one area of concern in their lives and chose a goal to focus on for today.  Pt shared, to encourage another peer, that he has been sober for 10 years in the past with AA meetings but continued to relapse until he discovered [facility], another recovery program.  Pt states that he is here for SI and his biggest obstacle is finding a reason to live.  Pt states that he promised his sister he would never hurt himself again but continues to be depressed, hopeless and suicidal.  Pt states that he also suffers from PTSD and worries constantly.  Pt actively participated and was engaged in group discussion.     Reyes Ivan, LCSW 03/03/2013 3:03 PM

## 2013-03-03 NOTE — Progress Notes (Signed)
Adult Psychoeducational Group Note  Date:  03/03/2013 Time:  9:05 PM  Group Topic/Focus:  Wrap-Up Group:   The focus of this group is to help patients review their daily goal of treatment and discuss progress on daily workbooks.  Participation Level:  Active  Participation Quality:  Resistant  Affect:  Flat and Resistant  Cognitive:  Appropriate  Insight: Appropriate  Engagement in Group:  Developing/Improving and Resistant  Modes of Intervention:  Support  Additional Comments:  Pt states that he does not feel better and that he is still worried about getting into [place] and his housing situation. He states however that "that they are still making progress" and that he is hopeful that he will feel better since his meds have been increased. Pt was given support.   Veatrice Eckstein 03/03/2013, 9:05 PM

## 2013-03-03 NOTE — Progress Notes (Signed)
Patient ID: Edward [name], male   DOB: May 04, 1960, 52 y.o.   MRN: [id number]  D: Patient presents with depressed mood and affect. Patient states that he has SI but contracts for safety. Patient denies HI and A/V hallucinations. Patient complains of pain 8/10. Patient was given scheduled medications per physician orders for pain. Patient also complained of anxiety and requested PRN Klonopin.  A: Writer gave patient PRN Klonopin per physician orders. Writer offered support and encouragement to patient to come and speak with Clinical research associate if patient ever had any questions or concerns.   R: Patient states that he has no questions or concerns at this time. Patient's anxiety was relieved. Q15 minute safety checks are maintained and patient is safe at this time.

## 2013-03-03 NOTE — B[facility] Counselor (Signed)
Adult Comprehensive Assessment  Patient ID: Edward [name], male   DOB: Apr 29, 1960, 52 y.o.   MRN: [id number]  Information Source: Information source: Patient  Current Stressors:  Educational / Learning stressors: N/A Employment / Job issues: on disability Family Relationships: strained relationship with family Surveyor, quantity / Lack of resources (include bankruptcy): on fixed income, used disability check on hotel room Housing / Lack of housing: homeless Physical health (include injuries & life threatening diseases): Hepetitis C, hypothroidism Social relationships: girlfriend uses Substance abuse: relapsed on alcohol, marijuana and cocaine Bereavement / Loss: N/A  Living/Environment/Situation:  Living Arrangements: Other (Comment) Living conditions (as described by patient or guardian): Pt is homeless.  Pt was staying at [facility] for 60 days and left due to bed bugs, was staying in a hotel in [place] and moved to [place] a few days.  How long has patient lived in current situation?: 1 year What is atmosphere in current home: Temporary  Family History:  Marital status: Divorced Divorced, when?: 1995 Long term relationship, how long?: pt with girlfriend of 7 years What types of issues is patient dealing with in the relationship?: didn't get along Additional relationship information: N/A Does patient have children?: Yes How many children?: 1 How is patient's relationship with their children?: Pt has an adult daughter - distant relationship with her due to his mental illness  Childhood History:  By whom was/is the patient raised?: Mother;Father Additional childhood history information: Pt states that he was raised by both parents, father on the weekends.  Pt states that his childhood was chaotic and hectic due to his mother's alcohol use.  Description of patient's relationship with caregiver when they were a child: Pt states that he got along great with mother and  idolized him.  Strained relationship with mother due to her alcohol use.  Patient's description of current relationship with people who raised him/her: Father passed away when pt was 7 years old, mother passed away from cancer.   Does patient have siblings?: Yes Number of Siblings: 3 Description of patient's current relationship with siblings: 2 sisters passed away, 1 sister still living - distant relationship with her Did patient suffer any verbal/emotional/physical/sexual abuse as a child?: No Did patient suffer from severe childhood neglect?: Yes Patient description of severe childhood neglect: mother's alcohol abuse Has patient ever been sexually abused/assaulted/raped as an adolescent or adult?: No Was the patient ever a victim of a crime or a disaster?: No Witnessed domestic violence?: Yes Has patient been effected by domestic violence as an adult?: No Description of domestic violence: witnessed friends Education officer, environmental:  Highest grade of school patient has completed: GED Currently a Consulting civil engineer?: No Learning disability?: No  Employment/Work Situation:   Employment situation: On disability Why is patient on disability: PTSD How long has patient been on disability: 5 years Patient's job has been impacted by current illness: No What is the longest time patient has a held a job?: 5 years 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 Has patient ever been in the [employer]?: Yes (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) Has patient ever served in combat?: Yes Patient description of combat service: Psychologist, forensic Resources:   Surveyor, quantity resources: [employer];Medicaid Does patient have a representative payee or guardian?: No  Alcohol/Substance Abuse:   What has been your use of drugs/alcohol within the last 12 months?: Crack cocaine - "a couple hundred dollars worth", marijuana - 1 joint occasionally, alcohol - "beer until calm  down" If attempted suicide,  did drugs/alcohol play a role in this?: No Alcohol/Substance Abuse Treatment Hx: Past Tx, Inpatient If yes, describe treatment: [facility] meetings, [facility] - 3 times in the last 2 years, [facility] - 2 years ago Has alcohol/substance abuse ever caused legal problems?: No (DUI's in the past)  Social Support System:   Patient's Community Support System: Fair Museum/gallery exhibitions officer System: Pt denies his sister is supportive Type of faith/religion: Christian How does patient's faith help to cope with current illness?: prayer, Hotel manager:   Leisure and Hobbies: motorcycles  Strengths/Needs:   What things does the patient do well?: great shooter In what areas does patient struggle / problems for patient: Depression, anxiety, SI  Discharge Plan:   Does patient have access to transportation?: Yes Will patient be returning to same living situation after discharge?: Yes Currently receiving community mental health services: No If no, would patient like referral for services when discharged?: Yes (What county?) [facility] [place]) Does patient have financial barriers related to discharge medications?: No  Summary/Recommendations:     Patient is a 53 year old Caucasian Male with a diagnosis of PTSD, Mood Disorder NOS and Cocaine Use.  Patient is currently homeless in [place].  Pt states that he was having suicidal ideation again and felt his medication wasn't working so came here for help.  Patient will benefit from crisis stabilization, medication evaluation, group therapy and psycho education in addition to case management for discharge planning.    Horton, Salome Arnt. 03/03/2013

## 2013-03-04 LAB — HEPATIC FUNCTION PANEL
ALT: 114 U/L — ABNORMAL HIGH (ref 0–53)
Albumin: 3.5 g/dL (ref 3.5–5.2)
Alkaline Phosphatase: 75 U/L (ref 39–117)
Total Bilirubin: 0.2 mg/dL — ABNORMAL LOW (ref 0.3–1.2)
Total Protein: 7.3 g/dL (ref 6.0–8.3)

## 2013-03-04 MED ORDER — CLONAZEPAM 0.5 MG PO TABS
0.5000 mg | ORAL_TABLET | Freq: Three times a day (TID) | ORAL | Status: DC | PRN
  Administered 2013-03-04 – 2013-03-07 (×6): 0.5 mg via ORAL
  Filled 2013-03-04 (×6): qty 1

## 2013-03-04 NOTE — Progress Notes (Signed)
Adult Psychoeducational Group Note  Date:  03/04/2013 Time:  11:00am Group Topic/Focus:  Recovery Goals:   The focus of this group is to identify appropriate goals for recovery and establish a plan to achieve them.  Participation Level:  Did Not Attend  Participation Quality:    Affect:    Cognitive:    Insight:   Engagement in Group:    Modes of Intervention:    Additional Comments:  Pt did not attend.  Shelly Bombard D 03/04/2013, 1:07 PM

## 2013-03-04 NOTE — Progress Notes (Signed)
Pt agitated over the discontinuation of his Ambien and Klonopin. He reports not getting clarification on why. Pt informed that his Ambien was discontinued in the PA's rational to see the effectiveness of Remeron alone. Pt not pleased with explanation. Pt encouraged to listen to his Dr's rationale on his medication regimen. Pt reports not caring about his thoughts. He reported that Klonopin was a "miracle drug" for him and that it controlled his anxiety well. He reporting that he will need a mediator in the room, when he discusses these med changes with the Dr tomorrow. Pt is highly upset and is expressing HI Towards his assigned DR. He is verbalizing the desire to violent lash out at his provider. A shot of 5 mg of Haldol was given for this pt's agitation. Pt pleased with intervention. Pt's agitation was tremendously reduced. Pt went to bed shortly afterwards. Pt was given 5 mg of Ambien for sleep. A: Writer administered scheduled and prn medications to pt. Continued support and availability as needed was extended to this pt. Staff continue to monitor pt with q31min checks. Staff aware of pt's agitation.  R: No adverse drug reactions noted. Pt receptive to treatment. Pt remains safe at this time.

## 2013-03-04 NOTE — Progress Notes (Signed)
oritnThe focus of this group is to educate the patient on the purpose and policies of crisis stabilization and provide a format to answer questions about their admission.  The group details unit policies and expectations of patients while admitted.Patient did not attend, he stayed in bed.Did not sleep well last night.

## 2013-03-04 NOTE — Progress Notes (Signed)
Patient ID: Edward [name], male   DOB: [birth year], 52 y.o.   MRN: [id number] [facility] MD Progress Note  03/04/2013 5:39 PM Edward [name]  MRN:  [id number]  Subjective: Met with patient today and did discuss his acting out last pm. He is aware of the changes made in his medication and why. I have apologized for not speaking with him regarding the changes and he has accepted my apology.    Objective: He is calm cooperative and very different today. He is making better eye contact. His speech is clear and he is not constricted today. He is very positive about his continued recovery from substance abuse. Diagnosis:   DSM5:  Schizophrenia Disorders:  Obsessive-Compulsive Disorders:  Trauma-Stressor Disorders: Posttraumatic Stress Disorder (309.81)  Substance/Addictive Disorders: Alcohol Related Disorder - Severe (303.90) and Cannabis Use Disorder - Moderate 9304.30)  Depressive Disorders: Major Depressive Disorder - Severe (296.23)  AXIS I: Major Depression, Recurrent severe, Substance Induced Mood Disorder and Polysubstance abuse and PTSD  AXIS II: Deferred  AXIS III:  Past Medical History   Diagnosis  Date   .  Thyroid disease    .  Hypothyroidism    .  Mental disorder    .  Depression    .  Anxiety and depression    .  Major depression    .  PTSD (post-traumatic stress disorder)    .  Hepatitis      hep C   .  Meniscus tear  02/28/2013     Right side   .  Neuromuscular disorder      pinched nerve (on 02/28/13 pt denies having this problem)    AXIS IV: economic problems, housing problems, occupational problems, other psychosocial or environmental problems, problems related to social environment and problems with primary support group  AXIS V: 41-50 serious symptoms   ADL's:  Intact  Sleep: 5. 25  Appetite:  Good  Suicidal Ideation:  Yes, but can contract for safety. Homicidal Ideation:  denies AEB (as evidenced by):  Psychiatric Specialty Exam: Review of Systems  Constitutional:  Negative.  Negative for fever, chills, weight loss, malaise/fatigue and diaphoresis.  HENT: Negative for congestion and sore throat.   Eyes: Negative for blurred vision, double vision and photophobia.  Respiratory: Negative for cough, shortness of breath and wheezing.   Cardiovascular: Negative for chest pain, palpitations and PND.  Gastrointestinal: Negative for heartburn, nausea, vomiting, abdominal pain, diarrhea and constipation.  Musculoskeletal: Negative for falls and myalgias. Joint pain: knee pain.  Neurological: Negative for dizziness, tingling, tremors, sensory change, speech change, focal weakness, seizures, loss of consciousness, weakness and headaches.  Endo/Heme/Allergies: Negative for polydipsia. Does not bruise/bleed easily.  Psychiatric/Behavioral: Negative for depression, suicidal ideas, hallucinations, memory loss and substance abuse. The patient is not nervous/anxious and does not have insomnia.     Blood pressure 110/70, pulse 67, temperature 97.4 F (36.3 C), temperature source Oral, resp. rate 16, height 5\' 10"  (1.778 m), weight 81.647 kg (180 lb).Body mass index is 25.83 kg/(m^2).  General Appearance: Fairly Groomed  Patent attorney::  Good  Speech:  Clear and Coherent  Volume:  Normal  Mood:  sad  Affect:  calm  Thought Process:  Goal Directed  Orientation:  Full (Time, Place, and Person)  Thought Content:  NA  Suicidal Thoughts:  Yes.  without intent/plan  Homicidal Thoughts:  No  Memory:  Immediate;   Fair  Judgement:  Poor  Insight:  Lacking  Psychomotor Activity:  Normal  Concentration:  Fair  Recall:  Fair  Akathisia:  No  Handed:  Right  AIMS (if indicated):     Assets:  Physical Health  Sleep:  Number of Hours: 5.25   Current Medications: Current Facility-Administered Medications  Medication Dose Route Frequency Provider Last Rate Last Dose  . alum & mag hydroxide-simeth (MAALOX/MYLANTA) 200-200-20 MG/5ML suspension 30 mL  30 mL Oral Q4H PRN  Earney Navy, NP      . ARIPiprazole (ABILIFY) tablet 10 mg  10 mg Oral BID Earney Navy, NP   10 mg at 03/04/13 0806  . clonazePAM (KLONOPIN) tablet 0.5 mg  0.5 mg Oral TID PRN Verne Spurr, PA-C      . feeding supplement (ENSURE COMPLETE) (ENSURE COMPLETE) liquid 237 mL  237 mL Oral BID BM Lavena Bullion, RD   237 mL at 03/04/13 1458  . gabapentin (NEURONTIN) tablet 600 mg  600 mg Oral TID Earney Navy, NP   600 mg at 03/04/13 1647  . ibuprofen (ADVIL,MOTRIN) tablet 600 mg  600 mg Oral BID Kristeen Mans, NP   600 mg at 03/04/13 0806  . levothyroxine (SYNTHROID, LEVOTHROID) tablet 100 mcg  100 mcg Oral QAC breakfast Earney Navy, NP   100 mcg at 03/04/13 0651  . magnesium hydroxide (MILK OF MAGNESIA) suspension 30 mL  30 mL Oral Daily PRN Earney Navy, NP      . mirtazapine (REMERON) tablet 7.5 mg  7.5 mg Oral QHS Nehemiah Settle, MD   7.5 mg at 03/03/13 2104  . multivitamin with minerals tablet 1 tablet  1 tablet Oral Daily Lavena Bullion, RD   1 tablet at 03/04/13 0806  . nicotine (NICODERM CQ - dosed in mg/24 hours) patch 21 mg  21 mg Transdermal Daily Earney Navy, NP   21 mg at 03/04/13 0653  . PARoxetine (PAXIL) tablet 60 mg  60 mg Oral Daily Nehemiah Settle, MD   60 mg at 03/04/13 1610    Lab Results: No results found for this or any previous visit (from the past 48 hour(s)).  Physical Findings: AIMS: Facial and Oral Movements Muscles of Facial Expression: None, normal Lips and Perioral Area: None, normal Jaw: None, normal Tongue: None, normal,Extremity Movements Upper (arms, wrists, hands, fingers): None, normal Lower (legs, knees, ankles, toes): None, normal, Trunk Movements Neck, shoulders, hips: None, normal, Overall Severity Severity of abnormal movements (highest score from questions above): None, normal Incapacitation due to abnormal movements: None, normal Patient's awareness of abnormal movements (rate only  patient's report): No Awareness, Dental Status Current problems with teeth and/or dentures?: No Does patient usually wear dentures?: No  CIWA:    COWS:     Treatment Plan Summary: Daily contact with patient to assess and evaluate symptoms and progress in treatment Medication management  Plan: 1. Continue crisis management and stabilization. 2. Medication management to reduce current symptoms to base line and improve patient's overall level of functioning 3. Treat health problems as indicated. 4. Develop treatment plan to decrease risk of relapse upon discharge and the need for readmission. 5. Psycho-social education regarding relapse prevention and self care. 6. Health care follow up as needed for medical problems. 7. Continue home medications where appropriate. 8. D/C ambien and will use Remeron as that has worked in the past.  9. Patient education done regarding the use of Benzodiazepines, Ambien and Klonopin. Due to his history of Hep C untreated, he is educated about the use of these medications and his liver.  He is given the choice of using either Ambien at hs OR Klonopin during the day for irritability and anxiety for a short course.  He chose the klonopin 0.5 po TID prn anxiety. 10. Patient education done regarding the use of 2 antipsychotics and side effects and he elected to continue using Abilify with his Paxil. 11. Will order a hepatic panel to see where he is currently. 12. Will follow.    Medical Decision Making Problem Points:  Established problem, stable/improving (1) Data Points:  Review and summation of old records (2)  I certify that inpatient services furnished can reasonably be expected to improve the patient's condition.   Rona Ravens. Mashburn RPAC 5:39 PM 03/04/2013 Agree with assessment and plan Madie Reno A. Azzam Mehra,M.D.

## 2013-03-04 NOTE — Progress Notes (Signed)
D: Pt mood is depressed and anxious. He is focused on his medications. His mood has improved tonight and he is pleasant and cooperative.  A: Support given. Verbalization encouraged. Medications given as prescribed. Pt encouraged to come to nurse with any concerns.  R: Pt is receptive. No complaints of pain or discomfort at this time. Q15 min safety checks maintained. Will continue to monitor pt.

## 2013-03-04 NOTE — Progress Notes (Addendum)
Recreation Therapy Notes  Date: 11.11.2014 Time: 2:45pm Location: [address] Dayroom  Group Topic: Software engineer Activities (AAA)  Behavioral Response: Engaged, Attentive, Appropriate  Affect: Euthymic  Clinical Observations/Feedback: Dog Team: [employer]. Patient interacted appropriately with peer, dog team, LRT and MHT.   Marykay Lex Meka Lewan, LRT/CTRS  Loron Weimer L 03/04/2013 5:14 PM

## 2013-03-04 NOTE — [facility] Group Notes (Signed)
[facility] LCSW Group Therapy  03/04/2013 1:15 PM   Type of Therapy:  Group Therapy  Participation Level:  Did Not Attend  Ivylynn Hoppes Horton, LCSW 03/04/2013 2:12 PM   

## 2013-03-04 NOTE — Progress Notes (Signed)
D: Patient admits to on and off thoughts of SI; also admits to some HI towards one of the psychiatrist on the 500 hall and reports someone needs to be in the room with him when speaking with him; Nurse Practitioner Mashburn made aware   A: Monitored q 15 minutes; patient encouraged to attend groups; patient educated about medications; patient given medications per physician orders; patient encouraged to express feelings and/or concerns  R: Patient is appropriate to circumstances; patient is reporting that he is feeling of anxiety; patient  interaction with staff and peers has been appropriate; patient is taking medications as prescribed and tolerating medications; patient is not attending any groups because he reports " I feel rundown"

## 2013-03-05 DIAGNOSIS — F1220 Cannabis dependence, uncomplicated: Secondary | ICD-10-CM

## 2013-03-05 DIAGNOSIS — F1020 Alcohol dependence, uncomplicated: Secondary | ICD-10-CM

## 2013-03-05 NOTE — Progress Notes (Signed)
Adult Psychoeducational Group Note  Date:  03/04/13 Time:  8:00pm  Group Topic/Focus:  Wrap-Up Group:   The focus of this group is to help patients review their daily goal of treatment and discuss progress on daily workbooks.  Participation Level:  Active  Participation Quality:  Appropriate and Sharing  Affect:  Appropriate  Cognitive:  Appropriate  Insight: Appropriate  Engagement in Group:  Engaged  Modes of Intervention:  Discussion, Education, Socialization and Support  Additional Comments:  Pt stated that he is in the hospital for PTSD and suicidal ideations. Pt stated that he is a honest and loyal person when asked to share two positive character traits.   Teirra Carapia 03/05/2013, 12:10 AM

## 2013-03-05 NOTE — Tx Team (Signed)
Interdisciplinary Treatment Plan Update (Adult)  Date: 03/05/2013  Time Reviewed:  9:45 AM  Progress in Treatment: Attending groups: Yes Participating in groups:  Yes Taking medication as prescribed:  Yes Tolerating medication:  Yes Family/Significant othe contact made: No, pt refused Patient understands diagnosis:  Yes Discussing patient identified problems/goals with staff:  Yes Medical problems stabilized or resolved:  Yes Denies suicidal/homicidal ideation: Yes Issues/concerns per patient self-inventory:  Yes Other:  New problem(s) identified: N/A  Discharge Plan or Barriers: Pt has follow up scheduled at [facility] for medication management and therapy.    Reason for Continuation of Hospitalization: Anxiety Depression Medication Stabilization  Comments: N/A  Estimated length of stay: 2-3 days  For review of initial/current patient goals, please see plan of care.  Attendees: Patient:     Family:     Physician:  Dr. Javier Glazier 03/05/2013 10:11 AM   Nursing:   Harold Barban, RN 03/05/2013 10:11 AM   Clinical Social Worker:  Reyes Ivan, LCSW 03/05/2013 10:11 AM   Other: Verne Spurr, PA 03/05/2013 10:11 AM   Other:  Frankey Shown, MA care coordination 03/05/2013 10:11 AM   Other:  Juline Patch, LCSW 03/05/2013 10:11 AM   Other:  Burnetta Sabin, RN 03/05/2013 10:11 AM   Other: Marzetta Board, RN 03/05/2013 10:11 AM   Other: Onnie Boer, RN case manager 03/05/2013 10:11 AM   Other:    Other:    Other:    Other:     Scribe for Treatment Team:   Carmina Miller, 03/05/2013 10:11 AM

## 2013-03-05 NOTE — Progress Notes (Signed)
D: Patient denies HI and A/V hallucinations and admits to some thoughts of SI but contracts; patient reports sleep is poor because he woke up with a headache; reports appetite is improving ; reports energy level is low ; reports ability to pay attention is poor; rates depression as 10/10; rates hopelessness 10/10;  A: Monitored q 15 minutes; patient encouraged to attend groups; patient educated about medications; patient given medications per physician orders; patient encouraged to express feelings and/or concerns  R: Patient is flat, sad, and depressed; patient's interaction with staff and peers is appropriate but very minimal; patient was able to set goal to talk with staff 1:1 when having feelings of SI; patient is taking medications as prescribed and tolerating medications; patient is not attending any groups

## 2013-03-05 NOTE — [facility] Group Notes (Signed)
[facility] LCSW Group Therapy  03/05/2013  1:15 PM   Type of Therapy:  Group Therapy  Participation Level:  Did Not Attend  Reyes Ivan, LCSW 03/05/2013 2:07 PM

## 2013-03-05 NOTE — Progress Notes (Signed)
  Adult Psychoeducational Group Note  Date:  03/05/2013 Time:  10:00am Group Topic/Focus:  Therapeutic Activity  Participation Level:  Minimal  Participation Quality:  Appropriate and Attentive  Affect:  Appropriate  Cognitive:  Alert and Appropriate  Insight: Appropriate  Engagement in Group:  Engaged  Modes of Intervention:  Discussion and Education  Additional Comments:  Pt attended and participated in group. Discussion was on naming a happy time in your life. The happiest time in his life was when his daughter was born.  Shelly Bombard D 03/05/2013, 1:24 PM

## 2013-03-05 NOTE — Progress Notes (Signed)
Patient ID: Edward [name], male   DOB: 03-12-1961, 52 y.o.   MRN: [id number] [facility] MD Progress Note  03/05/2013 4:16 PM Edward [name]  MRN:  [id number]  Subjective: Patient was seen lying down in his bed, calm and cooperative. Patient has been compliant with his medication management but refused to attend therapeutic groups. Patient stated that has been waiting to be discharged and may need to go to a shelter because he does not have a funds until the first of the next month. Patient has been compliant with his medication without adverse effects.  Objective: He is calm cooperative and very different today. He is making better eye contact. His speech is clear and he is not constricted today. He is very positive about his continued recovery from substance abuse.  Diagnosis:   DSM5:  Schizophrenia Disorders:  Obsessive-Compulsive Disorders:  Trauma-Stressor Disorders: Posttraumatic Stress Disorder (309.81)  Substance/Addictive Disorders: Alcohol Related Disorder - Severe (303.90) and Cannabis Use Disorder - Moderate 9304.30)  Depressive Disorders: Major Depressive Disorder - Severe (296.23)  AXIS I: Major Depression, Recurrent severe, Substance Induced Mood Disorder and Polysubstance abuse and PTSD  AXIS II: Deferred  AXIS III:  Past Medical History   Diagnosis  Date   .  Thyroid disease    .  Hypothyroidism    .  Mental disorder    .  Depression    .  Anxiety and depression    .  Major depression    .  PTSD (post-traumatic stress disorder)    .  Hepatitis      hep C   .  Meniscus tear  02/28/2013     Right side   .  Neuromuscular disorder      pinched nerve (on 02/28/13 pt denies having this problem)    AXIS IV: economic problems, housing problems, occupational problems, other psychosocial or environmental problems, problems related to social environment and problems with primary support group  AXIS V: 41-50 serious symptoms   ADL's:  Intact  Sleep: 5. 25  Appetite:  Good  Suicidal  Ideation:  Yes, but can contract for safety. Homicidal Ideation:  denies AEB (as evidenced by):  Psychiatric Specialty Exam: Review of Systems  Constitutional: Negative.  Negative for fever, chills, weight loss, malaise/fatigue and diaphoresis.  HENT: Negative for congestion and sore throat.   Eyes: Negative for blurred vision, double vision and photophobia.  Respiratory: Negative for cough, shortness of breath and wheezing.   Cardiovascular: Negative for chest pain, palpitations and PND.  Gastrointestinal: Negative for heartburn, nausea, vomiting, abdominal pain, diarrhea and constipation.  Musculoskeletal: Negative for falls and myalgias. Joint pain: knee pain.  Neurological: Negative for dizziness, tingling, tremors, sensory change, speech change, focal weakness, seizures, loss of consciousness, weakness and headaches.  Endo/Heme/Allergies: Negative for polydipsia. Does not bruise/bleed easily.  Psychiatric/Behavioral: Negative for depression, suicidal ideas, hallucinations, memory loss and substance abuse. The patient is not nervous/anxious and does not have insomnia.     Blood pressure 124/83, pulse 67, temperature 98.3 F (36.8 C), temperature source Oral, resp. rate 16, height 5\' 10"  (1.778 m), weight 81.647 kg (180 lb).Body mass index is 25.83 kg/(m^2).  General Appearance: Fairly Groomed  Patent attorney::  Good  Speech:  Clear and Coherent  Volume:  Normal  Mood:  sad  Affect:  calm  Thought Process:  Goal Directed  Orientation:  Full (Time, Place, and Person)  Thought Content:  NA  Suicidal Thoughts:  Yes.  without intent/plan  Homicidal Thoughts:  No  Memory:  Immediate;   Fair  Judgement:  Poor  Insight:  Lacking  Psychomotor Activity:  Normal  Concentration:  Fair  Recall:  Fair  Akathisia:  No  Handed:  Right  AIMS (if indicated):     Assets:  Physical Health  Sleep:  Number of Hours: 6.25   Current Medications: Current Facility-Administered Medications   Medication Dose Route Frequency Provider Last Rate Last Dose  . alum & mag hydroxide-simeth (MAALOX/MYLANTA) 200-200-20 MG/5ML suspension 30 mL  30 mL Oral Q4H PRN Earney Navy, NP      . ARIPiprazole (ABILIFY) tablet 10 mg  10 mg Oral BID Earney Navy, NP   10 mg at 03/05/13 0801  . clonazePAM (KLONOPIN) tablet 0.5 mg  0.5 mg Oral TID PRN Verne Spurr, PA-C   0.5 mg at 03/05/13 0803  . feeding supplement (ENSURE COMPLETE) (ENSURE COMPLETE) liquid 237 mL  237 mL Oral BID BM Lavena Bullion, RD   237 mL at 03/04/13 2112  . gabapentin (NEURONTIN) tablet 600 mg  600 mg Oral TID Earney Navy, NP   600 mg at 03/05/13 1155  . ibuprofen (ADVIL,MOTRIN) tablet 600 mg  600 mg Oral BID Kristeen Mans, NP   600 mg at 03/05/13 0801  . levothyroxine (SYNTHROID, LEVOTHROID) tablet 100 mcg  100 mcg Oral QAC breakfast Earney Navy, NP   100 mcg at 03/05/13 1191  . magnesium hydroxide (MILK OF MAGNESIA) suspension 30 mL  30 mL Oral Daily PRN Earney Navy, NP      . mirtazapine (REMERON) tablet 7.5 mg  7.5 mg Oral QHS Nehemiah Settle, MD   7.5 mg at 03/04/13 2111  . multivitamin with minerals tablet 1 tablet  1 tablet Oral Daily Lavena Bullion, RD   1 tablet at 03/05/13 0803  . nicotine (NICODERM CQ - dosed in mg/24 hours) patch 21 mg  21 mg Transdermal Daily Earney Navy, NP   21 mg at 03/05/13 0626  . PARoxetine (PAXIL) tablet 60 mg  60 mg Oral Daily Nehemiah Settle, MD   60 mg at 03/05/13 0800    Lab Results:  Results for orders placed during the hospital encounter of 03/01/13 (from the past 48 hour(s))  HEPATIC FUNCTION PANEL     Status: Abnormal   Collection Time    03/04/13  7:47 PM      Result Value Range   Total Protein 7.3  6.0 - 8.3 g/dL   Albumin 3.5  3.5 - 5.2 g/dL   AST 66 (*) 0 - 37 U/L   ALT 114 (*) 0 - 53 U/L   Alkaline Phosphatase 75  39 - 117 U/L   Total Bilirubin 0.2 (*) 0.3 - 1.2 mg/dL   Bilirubin, Direct <4.7  0.0 - 0.3 mg/dL    Indirect Bilirubin NOT CALCULATED  0.3 - 0.9 mg/dL   Comment: Performed at [facility]    Physical Findings: AIMS: Facial and Oral Movements Muscles of Facial Expression: None, normal Lips and Perioral Area: None, normal Jaw: None, normal Tongue: None, normal,Extremity Movements Upper (arms, wrists, hands, fingers): None, normal Lower (legs, knees, ankles, toes): None, normal, Trunk Movements Neck, shoulders, hips: None, normal, Overall Severity Severity of abnormal movements (highest score from questions above): None, normal Incapacitation due to abnormal movements: None, normal Patient's awareness of abnormal movements (rate only patient's report): No Awareness, Dental Status Current problems with teeth and/or dentures?: No Does patient usually wear dentures?: No  CIWA:    COWS:     Treatment Plan Summary: Daily contact with patient to assess and evaluate symptoms and progress in treatment Medication management  Plan: 1. Continue crisis management and stabilization. 2. Medication management to reduce current symptoms to base line and improve patient's overall level of functioning 3. Treat health problems as indicated. 4. Develop treatment plan to decrease risk of relapse upon discharge and the need for readmission. 5. Psycho-social education regarding relapse prevention and self care. 6. Health care follow up as needed for medical problems. 7. Continue home medications where appropriate. 8. Patient education done regarding the use of Benzodiazepines, Ambien and Klonopin.  9. Continue using Abilify with his Paxil. 10. Will order a hepatic panel to see where he is currently.   Medical Decision Making Problem Points:  Established problem, stable/improving (1) Data Points:  Review and summation of old records (2)  I certify that inpatient services furnished can reasonably be expected to improve the patient's condition.    Roshni Burbano,JANARDHAHA  R. 03/05/2013 4:19 PM

## 2013-03-05 NOTE — Progress Notes (Signed)
Adult Psychoeducational Group Note  Date:  03/05/2013 Time:  9:27 PM  Group Topic/Focus:  Wrap-Up Group:   The focus of this group is to help patients review their daily goal of treatment and discuss progress on daily workbooks.  Participation Level:  Active  Participation Quality:  Appropriate  Affect:  Appropriate  Cognitive:  Appropriate  Insight: Appropriate  Engagement in Group:  Lacking  Modes of Intervention:  Support  Additional Comments:  Pt states that this is the only group that he has been in today and that being able to watch his peers interact with their families has given him hope and got him thinking that he needs to "get his mind right". Pt was given support and to try to be more active in the future and attend more groups.   [name] 03/05/2013, 9:27 PM

## 2013-03-05 NOTE — [facility] Group Notes (Signed)
[facility] LCSW Aftercare Discharge Planning Group Note   03/05/2013  8:45 AM  Participation Quality:  Did Not Attend  Edward Ivan, LCSW 03/05/2013 10:08 AM

## 2013-03-05 NOTE — Progress Notes (Signed)
Recreation Therapy Notes  Date: 11.12.2014 Time: 3:00pm Location: [address] Dayroom    Group Topic: Communication, Team Building, Problem Solving  Goal Area(s) Addresses:  Patient will effectively work with peer towards shared goal.  Patient will identify skill used to make activity successful.  Patient will identify how skills used during activity can be used to reach post d/c goals.   Behavioral Response: Observation  Intervention: Problem Solving Activity  Activity: Landing Pad. In teams patients were given 12 plastic drinking straws and a length of masking tape. Using the materials provided patients were asked to build a landing pad to catch a golf ball dropped from approximately 6 feet in the air.   Education: Special educational needs teacher, Team Work, Building control surveyor, Journalist, newspaper.   Education Outcome: Acknowledges understanding   Clinical Observations/Feedback: Patient arrived late to group session, upon arrival patient assumed role of observer. Patient contributed to group discussion, identifying communication as a skill used during group session, patient made no additional contributions to group session, but appeared to actively listen as he maintained appropriate eye contact with speaker.   Marykay Lex Tesha Archambeau, LRT/CTRS  Edward [name] L 03/05/2013 5:11 PM

## 2013-03-05 NOTE — Progress Notes (Signed)
Adult Psychoeducational Group Note  Date:  03/05/2013 Time:  2:20 PM  Group Topic/Focus:  Crisis Planning:   The purpose of this group is to help patients create a crisis plan for use upon discharge or in the future, as needed.  Participation Level:  Minimal  Participation Quality:  Appropriate  Affect:  Anxious, Blunted and Irritable  Cognitive:  Alert, Appropriate and Oriented  Insight: Good  Engagement in Group:  Limited  Modes of Intervention:  Discussion and Education  Additional Comments:  Pt attended morning Psychoeducational skills group with peers. Goal of group was to outline the aspects of an effective crisis plan. Pt was irritable during group, but did attend the full time. 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  Orma Render 03/05/2013, 2:20 PM

## 2013-03-06 MED ORDER — CELECOXIB 100 MG PO CAPS
200.0000 mg | ORAL_CAPSULE | Freq: Every day | ORAL | Status: DC
  Administered 2013-03-06 – 2013-03-07 (×2): 200 mg via ORAL
  Filled 2013-03-06: qty 1
  Filled 2013-03-06: qty 2
  Filled 2013-03-06 (×2): qty 1
  Filled 2013-03-06: qty 2

## 2013-03-06 NOTE — Progress Notes (Signed)
The focus of this group is to educate the patient on the purpose and policies of crisis stabilization and provide a format to answer questions about their admission.  The group details unit policies and expectations of patients while admitted.  Patient attended 0900 nurse education orientation group this morning.  Patient actively participated, appropriate affect, alert, appropriate insight and engagement.  Today patient will work on goals for discharge.  

## 2013-03-06 NOTE — Progress Notes (Signed)
Adult Psychoeducational Group Note  Date:  03/06/2013 Time:  8:00PM Group Topic/Focus:  Wrap-Up Group:   The focus of this group is to help patients review their daily goal of treatment and discuss progress on daily workbooks.  Participation Level:  Active  Participation Quality:  Appropriate and Attentive  Affect:  Appropriate  Cognitive:  Alert and Appropriate  Insight: Appropriate  Engagement in Group:  Engaged  Modes of Intervention:  Socialization  Additional Comments:  Pt. Was appropriate and attentive during tonight's group. Pt attended Karaoke.   Bing Plume D 03/06/2013, 8:26 PM

## 2013-03-06 NOTE — Progress Notes (Signed)
Patient ID: Edward [name], male   DOB: [birth year], 52 y.o.   MRN: [id number] [facility] MD Progress Note  03/06/2013 12:21 PM Edward [name]  MRN:  [id number]  Subjective: [name] states he is depressed and guilty ridden today. He states his depression is the worst it has ever been. He can not say why he feels so depressed. He did speak with the [facility] nurse and he is not sure he can get into the shelter tomorrow. He states he will need to leave in the mid morning. When asked if he had given any thought to leaving the hospital since his depression is so severe.  He states he will have to rethink how he feels and hopes it changes over night. Objective:  Diagnosis:   DSM5:  Schizophrenia Disorders:  Obsessive-Compulsive Disorders:  Trauma-Stressor Disorders: Posttraumatic Stress Disorder (309.81)  Substance/Addictive Disorders: Alcohol Related Disorder - Severe (303.90) and Cannabis Use Disorder - Moderate 9304.30)  Depressive Disorders: Major Depressive Disorder - Severe (296.23)  AXIS I: Major Depression, Recurrent severe, Substance Induced Mood Disorder and Polysubstance abuse and PTSD  AXIS II: Deferred  AXIS III:  Past Medical History   Diagnosis  Date   .  Thyroid disease    .  Hypothyroidism    .  Mental disorder    .  Depression    .  Anxiety and depression    .  Major depression    .  PTSD (post-traumatic stress disorder)    .  Hepatitis      hep C   .  Meniscus tear  02/28/2013     Right side   .  Neuromuscular disorder      pinched nerve (on 02/28/13 pt denies having this problem)    AXIS IV: economic problems, housing problems, occupational problems, other psychosocial or environmental problems, problems related to social environment and problems with primary support group  AXIS V: 41-50 serious symptoms   ADL's:  Intact  Sleep: 5. 25  Appetite:  Good  Suicidal Ideation:  Yes, but can contract for safety. Homicidal Ideation:  denies AEB (as evidenced by):  Psychiatric  Specialty Exam: Review of Systems  Constitutional: Negative.  Negative for fever, chills, weight loss, malaise/fatigue and diaphoresis.  HENT: Negative for congestion and sore throat.   Eyes: Negative for blurred vision, double vision and photophobia.  Respiratory: Negative for cough, shortness of breath and wheezing.   Cardiovascular: Negative for chest pain, palpitations and PND.  Gastrointestinal: Negative for heartburn, nausea, vomiting, abdominal pain, diarrhea and constipation.  Musculoskeletal: Negative for falls and myalgias. Joint pain: knee pain.  Neurological: Negative for dizziness, tingling, tremors, sensory change, speech change, focal weakness, seizures, loss of consciousness, weakness and headaches.  Endo/Heme/Allergies: Negative for polydipsia. Does not bruise/bleed easily.  Psychiatric/Behavioral: Negative for depression, suicidal ideas, hallucinations, memory loss and substance abuse. The patient is not nervous/anxious and does not have insomnia.     Blood pressure 88/53, pulse 60, temperature 97.5 F (36.4 C), temperature source Oral, resp. rate 16, height 5\' 10"  (1.778 m), weight 81.647 kg (180 lb).Body mass index is 25.83 kg/(m^2).  General Appearance: Fairly Groomed  Patent attorney::  Good  Speech:  Clear and Coherent  Volume:  Normal  Mood:  sad  Affect:  calm  Thought Process:  Goal Directed  Orientation:  Full (Time, Place, and Person)  Thought Content:  NA  Suicidal Thoughts:  Yes.  without intent/plan  Homicidal Thoughts:  No  Memory:  Immediate;   Fair  Judgement:  Poor  Insight:  Lacking  Psychomotor Activity:  Normal  Concentration:  Fair  Recall:  Fair  Akathisia:  No  Handed:  Right  AIMS (if indicated):     Assets:  Physical Health  Sleep:  Number of Hours: 5.25   Current Medications: Current Facility-Administered Medications  Medication Dose Route Frequency Provider Last Rate Last Dose  . alum & mag hydroxide-simeth (MAALOX/MYLANTA) 200-200-20  MG/5ML suspension 30 mL  30 mL Oral Q4H PRN Earney Navy, NP      . ARIPiprazole (ABILIFY) tablet 10 mg  10 mg Oral BID Earney Navy, NP   10 mg at 03/06/13 0810  . clonazePAM (KLONOPIN) tablet 0.5 mg  0.5 mg Oral TID PRN Verne Spurr, PA-C   0.5 mg at 03/06/13 1100  . feeding supplement (ENSURE COMPLETE) (ENSURE COMPLETE) liquid 237 mL  237 mL Oral BID BM Lavena Bullion, RD   237 mL at 03/05/13 1948  . gabapentin (NEURONTIN) tablet 600 mg  600 mg Oral TID Earney Navy, NP   600 mg at 03/06/13 1151  . ibuprofen (ADVIL,MOTRIN) tablet 600 mg  600 mg Oral BID Kristeen Mans, NP   600 mg at 03/06/13 1610  . levothyroxine (SYNTHROID, LEVOTHROID) tablet 100 mcg  100 mcg Oral QAC breakfast Earney Navy, NP   100 mcg at 03/06/13 [phone]  . magnesium hydroxide (MILK OF MAGNESIA) suspension 30 mL  30 mL Oral Daily PRN Earney Navy, NP      . mirtazapine (REMERON) tablet 7.5 mg  7.5 mg Oral QHS Nehemiah Settle, MD   7.5 mg at 03/05/13 2119  . multivitamin with minerals tablet 1 tablet  1 tablet Oral Daily Lavena Bullion, RD   1 tablet at 03/06/13 [phone]  . nicotine (NICODERM CQ - dosed in mg/24 hours) patch 21 mg  21 mg Transdermal Daily Earney Navy, NP   21 mg at 03/06/13 0639  . PARoxetine (PAXIL) tablet 60 mg  60 mg Oral Daily Nehemiah Settle, MD   60 mg at 03/06/13 8119    Lab Results:  Results for orders placed during the hospital encounter of 03/01/13 (from the past 48 hour(s))  HEPATIC FUNCTION PANEL     Status: Abnormal   Collection Time    03/04/13  7:47 PM      Result Value Range   Total Protein 7.3  6.0 - 8.3 g/dL   Albumin 3.5  3.5 - 5.2 g/dL   AST 66 (*) 0 - 37 U/L   ALT 114 (*) 0 - 53 U/L   Alkaline Phosphatase 75  39 - 117 U/L   Total Bilirubin 0.2 (*) 0.3 - 1.2 mg/dL   Bilirubin, Direct <1.4  0.0 - 0.3 mg/dL   Indirect Bilirubin NOT CALCULATED  0.3 - 0.9 mg/dL   Comment: Performed at [facility]    Physical  Findings: AIMS: Facial and Oral Movements Muscles of Facial Expression: None, normal Lips and Perioral Area: None, normal Jaw: None, normal Tongue: None, normal,Extremity Movements Upper (arms, wrists, hands, fingers): None, normal Lower (legs, knees, ankles, toes): None, normal, Trunk Movements Neck, shoulders, hips: None, normal, Overall Severity Severity of abnormal movements (highest score from questions above): None, normal Incapacitation due to abnormal movements: None, normal Patient's awareness of abnormal movements (rate only patient's report): No Awareness, Dental Status Current problems with teeth and/or dentures?: No Does patient usually wear dentures?: No  CIWA:    COWS:  Treatment Plan Summary: Daily contact with patient to assess and evaluate symptoms and progress in treatment Medication management  Plan: 1. Continue crisis management and stabilization. 2. Medication management to reduce current symptoms to base line and improve patient's overall level of functioning 3. Treat health problems as indicated. 4. Develop treatment plan to decrease risk of relapse upon discharge and the need for readmission. 5. Psycho-social education regarding relapse prevention and self care. 6. Health care follow up as needed for medical problems. 7. Continue home medications where appropriate. 8. Patient education done regarding the use of Benzodiazepines, Ambien and Klonopin.  9. Continue using Abilify with his Paxil 10. Patient is informed that his hepatic panel shows improvement. 11. Will order Celebrex for his knee pain now..   Medical Decision Making Problem Points:  Established problem, stable/improving (1) Data Points:  Review and summation of old records (2)  I certify that inpatient services furnished can reasonably be expected to improve the patient's condition.    MASHBURN,NEIL 03/06/2013 12:21 PM  Reviewed the information documented and agree with the  treatment plan.  Jakki Doughty,JANARDHAHA R. 03/08/2013 11:12 AM

## 2013-03-06 NOTE — Progress Notes (Signed)
D: Pt mood is depressed. He rates his depression 7/10. States that he just doesn't feel like doing anything and that he missed most of the groups today. He did attend tonight's group. Pt has been pleasant and cooperative. Pt reports passive SI but verbally contracts for safety. A: Support given. Verbalization encouraged. Medications given as prescribed. Pt encouraged to come to nurse with any concerns.  R: Pt is receptive. No complaints of pain or discomfort at this time. Q15 min safety checks maintained. Will continue to monitor pt.

## 2013-03-06 NOTE — Progress Notes (Signed)
Adult Psychoeducational Group Note  Date:  03/06/2013 Time:  1:40 PM  Group Topic/Focus:  Self Esteem Action Plan:   The focus of this group is to help patients create a plan to continue to build self-esteem after discharge.  Participation Level:  None  Participation Quality:  Attentive  Affect:  Depressed and Flat  Cognitive:  Alert  Insight: None  Engagement in Group:  None and Resistant  Modes of Intervention:  Activity, Discussion, Exploration, Socialization and Support  Additional Comments:  Pt came to group and was asked to share on several occassions but pt declined to answer.Pt left group to get medication, but did return to group.  Pt completed a self-esteem action plan and discussed several activities and strategies to help develop a positive self-esteem. Pt was encouraged throughout the group to share the answers to the questions and get feedback and support from the group. Pt discussed positive things about themselves and made positive goals for the future.   Cathlean Cower 03/06/2013, 1:40 PM

## 2013-03-06 NOTE — Progress Notes (Signed)
Recreation Therapy Notes  Date: 11.13.2014 Time: 2:30pm Location: [address] Dayroom  Group Topic: Software engineer Activities (AAA)  Behavioral Response: Did not attend.   Marykay Lex Kitana Gage, LRT/CTRS  Jearl Klinefelter 03/06/2013 4:46 PM

## 2013-03-06 NOTE — Progress Notes (Signed)
D: Pt passive SI, but contracts for safety. Pt denies HI/AVH. Pt is pleasant and cooperative. Pt had ok day, pt states he's ready to D/C tomorrow.  A: Pt was offered support and encouragement. Pt was given scheduled medications. Pt was encourage to attend groups. Q 15 minute checks were done for safety.   R:Pt attends groups and interacts well with peers and staff. Pt is taking medication. Pt has no complaints.Pt receptive to treatment and safety maintained on unit.

## 2013-03-06 NOTE — Progress Notes (Signed)
D:  Patient's self inventory sheet, patient has poor sleep, improving appetite, low energy level, poor attention span.  Rated depression and hopeless #10.  Denied withdrawals.  SI, contracts for safety.  Has felt pain in past 24 hours, pain #10.  Has high anxiety.  No discharge plans.  No problems taking medications after discharge. A:  Medications administered per MD orders.  Emotional support and encouragement given patient. R:  Denied HI.  Denied A/V hallucinations.  SI, contracts for safety.  Will continue to monitor patient for safety with 15 minute checks.  Safety maintained.

## 2013-03-06 NOTE — [facility] Group Notes (Signed)
[facility] LCSW Group Therapy  03/06/2013  1:15 PM   Type of Therapy:  Group Therapy  Participation Level:  Active  Participation Quality:  Appropriate and Attentive  Affect:  Appropriate and Calm  Cognitive:  Alert and Appropriate  Insight:  Developing/Improving and Engaged  Engagement in Therapy:  Developing/Improving and Engaged  Modes of Intervention:  Clarification, Confrontation, Discussion, Education, Exploration, Limit-setting, Orientation, Problem-solving, Rapport Building, Dance movement psychotherapist, Socialization and Support  Summary of Progress/Problems: The topic for group was balance in life.  Today's group focused on defining balance in one's own words, identifying things that can knock one off balance, and exploring healthy ways to maintain balance in life. Group members were asked to provide an example of a time when they felt off balance, describe how they handled that situation,and process healthier ways to regain balance in the future. Group members were asked to share the most important tool for maintaining balance that they learned while at [facility] and how they plan to apply this method after discharge.  Pt was very talkative in group, talking about what his life was like before.  Pt states that his life was balanced in 1990, when he had a good job, a family and things were going well.  Pt states that he lost balance when he got PTSD.  Pt was able to process how this happened and the impact it has had on his life.  Pt discussed a plan to move to [place] eventually, as he reports there are more resources and social service benefits there.  Pt states that he has already looked up [facility] meeting in [place] and is happy they are offered on Fridays and Sundays, his two hardest days.  Pt actively participated and was engaged in group discussion.    Kaily Wragg Horton, LCSW 03/06/2013 2:00 pm

## 2013-03-07 MED ORDER — CELECOXIB 200 MG PO CAPS: 200.0000 mg | ORAL_CAPSULE | Freq: Every day | ORAL | Status: DC

## 2013-03-07 MED ORDER — PAROXETINE HCL 30 MG PO TABS: ORAL_TABLET | ORAL | Status: DC

## 2013-03-07 MED ORDER — CLONAZEPAM 0.5 MG PO TABS: 0.5000 mg | ORAL_TABLET | Freq: Three times a day (TID) | ORAL | Status: DC | PRN

## 2013-03-07 MED ORDER — MIRTAZAPINE 7.5 MG PO TABS: 7.5000 mg | ORAL_TABLET | Freq: Every day | ORAL | Status: DC

## 2013-03-07 MED ORDER — ARIPIPRAZOLE 10 MG PO TABS: 10.0000 mg | ORAL_TABLET | Freq: Two times a day (BID) | ORAL | Status: DC

## 2013-03-07 MED ORDER — GABAPENTIN 600 MG PO TABS: 600.0000 mg | ORAL_TABLET | Freq: Three times a day (TID) | ORAL | Status: DC

## 2013-03-07 MED ORDER — GLUCOSAMINE-CHONDROITIN PO CAPS: 1.0000 | ORAL_CAPSULE | Freq: Every morning | ORAL | Status: DC

## 2013-03-07 NOTE — Progress Notes (Signed)
Patient ID: Edward [name], male   DOB: 1960/07/26, 52 y.o.   MRN: [id number] Patient discharged per physician order; patient denies SI/HI and A/V hallucinations; patient received samples, prescriptions, copy of AVS, and 2 bus passes after it was reviewed; patient had no other questions or concerns at this time; patient verbalized and signed that he received all belongings; patient left the unit ambulatory

## 2013-03-07 NOTE — [facility] Group Notes (Signed)
[facility] LCSW Aftercare Discharge Planning Group Note   03/07/2013 8:45 AM  Participation Quality:  Alert and Appropriate   Mood/Affect:  Appropriate, Flat and Depressed  Depression Rating:  "high"  Anxiety Rating:  "high"  Thoughts of Suicide:  Pt endorses SI, denies HI  Will you contract for safety?   Yes  Current AVH:  Pt denies  Plan for Discharge/Comments:  Pt attended discharge planning group and actively participated in group.  CSW provided pt with today's workbook.  Pt states that he is doing okay today.  Pt states that he is still very depressed, anxious and suicidal, but then states he's ready to d/c.  Pt will work on getting into the shelter in [place].  Pt will follow up at [facility] for medication management and therapy.  No further needs voiced by pt at this time.    Transportation Means: Pt reports access to transportation - provided pt with bus passes  Supports: No supports mentioned at this time  Reyes Ivan, LCSW 03/07/2013 9:53 AM

## 2013-03-07 NOTE — Discharge Summary (Signed)
Physician Discharge Summary Note  Patient:  Edward [name] is an 52 y.o., male MRN:  [id number] DOB:  05-18-1960 Patient phone:  [phone] (home)  Patient address:   [address] [place] [address],   Date of Admission:  03/01/2013 Date of Discharge:  03/07/2013  Reason for Admission:  Worsening depression with suicidal ideation  Discharge Diagnoses: Active Problems:   Alcohol dependence   Cocaine abuse   Substance induced mood disorder   Major depression   PTSD (post-traumatic stress disorder)  ROS  DSM5: Schizophrenia Disorders:  Obsessive-Compulsive Disorders:  Trauma-Stressor Disorders: Posttraumatic Stress Disorder (309.81)  Substance/Addictive Disorders: Alcohol Related Disorder - Severe (303.90) and Cannabis Use Disorder - Moderate 9304.30)  Depressive Disorders: Major Depressive Disorder - Severe (296.23)  AXIS I: Major Depression, Recurrent severe, Substance Induced Mood Disorder and Polysubstance abuse and PTSD  AXIS II: Deferred  AXIS III:  Past Medical History   Diagnosis  Date   .  Thyroid disease    .  Hypothyroidism    .  Mental disorder    .  Depression    .  Anxiety and depression    .  Major depression    .  PTSD (post-traumatic stress disorder)    .  Hepatitis      hep C   .  Meniscus tear  02/28/2013     Right side   .  Neuromuscular disorder      pinched nerve (on 02/28/13 pt denies having this problem)    AXIS IV: economic problems, housing problems, occupational problems, other psychosocial or environmental problems, problems related to social environment and problems with primary support group  AXIS V: 41-50 serious symptoms   Level of Care:  OP  Hospital Course:  Chancellor Vanderloop is a 52 year old SWM with a history of PTSD who presented to the [facility] reporting initially knee pain, and then suicidal ideations for the past two weeks. He was evaluated and felt to be in need of acute psychiatric hospitalization and was accepted to [facility] for his  fifth admission in the past 13 months.  His labs in the ED showed him to be positive for cocaine and THC. His BAL was <11.       Upon arrival at the unit Yasser was evaluated and his symptoms were identified. Medication management was initiated. He was encouraged to participate in unit programming on problem solving and coping skills. He was a minimal participant in group and only logged 2-3 out of 15 or so offered during his admission.        He was evaluated each day by a clinical provider to assess his response to treatment and progress. He continued to endorse SI each day at the maximum level. However, on observation Ranger was seen laughing and smiling and flirting with male patients. This was felt to be in direct contrast to his stated level of depression.  He was offered further treatment at residential facilities but he declined the need for any further treatment for his substance abuse problems.       The day prior to his discharge it was noted that he continued to report the maximum score in terms of his depression and suicidal ideation despite significant aggressive medication management.       He stated that he would need to be discharged early so he could claim his place at a local half way house. When it was pointed out that it would be worrisome to discharge a patient  with his level of depression and suicidal ideation, he responded that "it'll likely clear up over night."        On the day of discharge as he predicted, Juanjose reported that he was not suicidal or homicidal and was ready for discharge.        It was felt that Enrrique was poorly motivated to remain sober and would likely relapse upon discharge. He was discharged as planned with the goal of following up as noted below. If he should need readmission in the future it is recommended that he receive a higher level of care. Placement should be sought elsewhere. Consults:  None  Significant Diagnostic Studies:  labs: CBC, CMP, UA, UDS,  BAL  Discharge Vitals:   Blood pressure 108/71, pulse 68, temperature 97.7 F (36.5 C), temperature source Oral, resp. rate 18, height 5\' 10"  (1.778 m), weight 81.647 kg (180 lb). Body mass index is 25.83 kg/(m^2). Lab Results:   Results for orders placed during the hospital encounter of 03/01/13 (from the past 72 hour(s))  HEPATIC FUNCTION PANEL     Status: Abnormal   Collection Time    03/04/13  7:47 PM      Result Value Range   Total Protein 7.3  6.0 - 8.3 g/dL   Albumin 3.5  3.5 - 5.2 g/dL   AST 66 (*) 0 - 37 U/L   ALT 114 (*) 0 - 53 U/L   Alkaline Phosphatase 75  39 - 117 U/L   Total Bilirubin 0.2 (*) 0.3 - 1.2 mg/dL   Bilirubin, Direct <4.7  0.0 - 0.3 mg/dL   Indirect Bilirubin NOT CALCULATED  0.3 - 0.9 mg/dL   Comment: Performed at [facility]    Physical Findings: AIMS: Facial and Oral Movements Muscles of Facial Expression: None, normal Lips and Perioral Area: None, normal Jaw: None, normal Tongue: None, normal,Extremity Movements Upper (arms, wrists, hands, fingers): None, normal Lower (legs, knees, ankles, toes): None, normal, Trunk Movements Neck, shoulders, hips: None, normal, Overall Severity Severity of abnormal movements (highest score from questions above): None, normal Incapacitation due to abnormal movements: None, normal Patient's awareness of abnormal movements (rate only patient's report): No Awareness, Dental Status Current problems with teeth and/or dentures?: No Does patient usually wear dentures?: No  CIWA:  CIWA-Ar Total: 1 COWS:  COWS Total Score: 1  Psychiatric Specialty Exam: See Psychiatric Specialty Exam and Suicide Risk Assessment completed by Attending Physician prior to discharge.  Discharge destination:  Home  Is patient on multiple antipsychotic therapies at discharge:  No   Has Patient had three or more failed trials of antipsychotic monotherapy by history:  No  Recommended Plan for Multiple Antipsychotic  Therapies: NA  Discharge Orders   Future Orders Complete By Expires   Diet - low sodium heart healthy  As directed    Discharge instructions  As directed    Comments:     Take all of your medications as directed. Be sure to keep all of your follow up appointments.  If you are unable to keep your follow up appointment, call your Doctor's office to let them know, and reschedule.  Make sure that you have enough medication to last until your appointment. Be sure to get plenty of rest. Going to bed at the same time each night will help. Try to avoid sleeping during the day.  Increase your activity as tolerated. Regular exercise will help you to sleep better and improve your mental health. Eating a heart healthy  diet is recommended. Try to avoid salty or fried foods. Be sure to avoid all alcohol and illegal drugs.   Increase activity slowly  As directed        Medication List    STOP taking these medications       ibuprofen 200 MG tablet  Commonly known as:  ADVIL,MOTRIN     naproxen 500 MG tablet  Commonly known as:  NAPROSYN      TAKE these medications     Indication   ARIPiprazole 10 MG tablet  Commonly known as:  ABILIFY  Take 1 tablet (10 mg total) by mouth 2 (two) times daily. For psychosis and mood stabilization and depression.   Indication:  Manic-Depression     celecoxib 200 MG capsule  Commonly known as:  CELEBREX  Take 1 capsule (200 mg total) by mouth daily. For joint pain.   Indication:  Joint Damage causing Pain and Loss of Function     clonazePAM 0.5 MG tablet  Commonly known as:  KLONOPIN  Take 1 tablet (0.5 mg total) by mouth 3 (three) times daily as needed (for agitation).   Indication:  Manic-Depression     gabapentin 600 MG tablet  Commonly known as:  NEURONTIN  Take 1 tablet (600 mg total) by mouth 3 (three) times daily. Anxiety and neuropathic pain.   Indication:  Agitation, Neuropathic Pain     Glucosamine-Chondroitin Caps  Take 1 capsule by mouth  every morning. Take one a day for joint pain.      levothyroxine 100 MCG tablet  Commonly known as:  SYNTHROID, LEVOTHROID  Take 1 tablet (100 mcg total) by mouth every morning. For thyroid disease.   Indication:  Underactive Thyroid     mirtazapine 7.5 MG tablet  Commonly known as:  REMERON  Take 1 tablet (7.5 mg total) by mouth at bedtime.   Indication:  Trouble Sleeping     PARoxetine 30 MG tablet  Commonly known as:  PAXIL  Take two tablets (60mg ) by mouth each morning for anxiety and depression.   Indication:  Major Depressive Disorder           Follow-up Information   Follow up with [facility] On 03/11/2013. (Walk in on this date for hospital discharge appointment. Walk in clinic is Monday - Friday 8 am - 3 pm. They will then schedule you for medication management and therapy.)    Contact information:   [address] [place] [address] Phone: [phone] Fax: [phone]      Follow-up recommendations:   Activities: Resume activity as tolerated. Diet: Heart healthy low sodium diet Tests: Follow up testing will be determined by your out patient provider. Comments:    Total Discharge Time:  Greater than 30 minutes.  Signed: MASHBURN,NEIL 03/07/2013, 10:02 AM  Patient seen face-to-face interview for psychiatric evaluation, suicide risk assessment, discussed this with the treatment team and formulated treatment plan.Reviewed the information documented and agree with the discharge treatment plan.  Nekhi Liwanag,JANARDHAHA R. 03/17/2013 6:16 PM

## 2013-03-07 NOTE — [facility] Suicide Risk Assessment (Signed)
Suicide Risk Assessment  Discharge Assessment     Demographic Factors:  Male, Adolescent or young adult, Caucasian, Low socioeconomic status, Living alone and Unemployed  Mental Status Per Nursing Assessment::   On Admission:     Current Mental Status by Physician: Mental Status Examination: Patient appeared as per his stated age, casually dressed, and fairly groomed, and maintaining good eye contact. Patient has good mood and his affect was constricted. He has normal rate, rhythm, and volume of speech. His thought process is linear and goal directed. Patient has denied suicidal, homicidal ideations, intentions or plans. Patient has no evidence of auditory or visual hallucinations, delusions, and paranoia. Patient has fair insight judgment and impulse control.  Loss Factors: Financial problems/change in socioeconomic status  Historical Factors: Prior suicide attempts and Impulsivity  Risk Reduction Factors:   NA  Continued Clinical Symptoms:  Depression:   Recent sense of peace/wellbeing Alcohol/Substance Abuse/Dependencies Personality Disorders:   Comorbid alcohol abuse/dependence Previous Psychiatric Diagnoses and Treatments Medical Diagnoses and Treatments/Surgeries  Cognitive Features That Contribute To Risk:  Polarized thinking    Suicide Risk:  Minimal: No identifiable suicidal ideation.  Patients presenting with no risk factors but with morbid ruminations; may be classified as minimal risk based on the severity of the depressive symptoms  Discharge Diagnoses:   AXIS I:  Major Depression, Recurrent severe, Post Traumatic Stress Disorder, Substance Induced Mood Disorder and Alcohol dependence and cocaine abuse AXIS II:  Deferred AXIS III:   Past Medical History  Diagnosis Date  . Thyroid disease   . Hypothyroidism   . Mental disorder   . Depression   . Anxiety and depression   . Major depression   . PTSD (post-traumatic stress disorder)   . Hepatitis     hep C   . Meniscus tear 02/28/2013    Right side  . Neuromuscular disorder     pinched nerve (on 02/28/13 pt denies having this problem)   AXIS IV:  economic problems, housing problems, occupational problems, other psychosocial or environmental problems, problems related to social environment and problems with primary support group AXIS V:  61-70 mild symptoms  Plan Of Care/Follow-up recommendations:  Activity:  as tolerated Diet:  regular  Is patient on multiple antipsychotic therapies at discharge:  No   Has Patient had three or more failed trials of antipsychotic monotherapy by history:  No  Recommended Plan for Multiple Antipsychotic Therapies: NA  Jaleea Alesi,JANARDHAHA R., MD 03/07/2013, 11:35 AM

## 2013-03-07 NOTE — Tx Team (Signed)
Interdisciplinary Treatment Plan Update (Adult)  Date: 03/07/2013  Time Reviewed:  9:45 AM  Progress in Treatment: Attending groups: Yes Participating in groups:  Yes Taking medication as prescribed:  Yes Tolerating medication:  Yes Family/Significant othe contact made: No, pt refused Patient understands diagnosis:  Yes Discussing patient identified problems/goals with staff:  Yes Medical problems stabilized or resolved:  Yes Denies suicidal/homicidal ideation: Yes Issues/concerns per patient self-inventory:  Yes Other:  New problem(s) identified: N/A  Discharge Plan or Barriers: Pt will follow up at [facility] for medication management and therapy.    Reason for Continuation of Hospitalization: Stable to d/c today  Comments: N/A  Estimated length of stay: D/C today  For review of initial/current patient goals, please see plan of care.  Attendees: Patient:  Edward [name] 03/07/2013 10:17 AM   Family:     Physician:  Dr. Javier Glazier 03/07/2013 10:17 AM   Nursing:   Harold Barban, RN 03/07/2013 10:17 AM   Clinical Social Worker:  Reyes Ivan, LCSW 03/07/2013 10:17 AM   Other: Verne Spurr, PA 03/07/2013 10:17 AM   Other:  Frankey Shown, MA care coordination 03/07/2013 10:17 AM   Other:  Juline Patch, LCSW 03/07/2013 10:17 AM   Other:  Burnetta Sabin, RN 03/07/2013 10:17 AM   Other:    Other:    Other:    Other:    Other:      Scribe for Treatment Team:   Carmina Miller, 03/07/2013 , 10:17 AM

## 2013-03-07 NOTE — Progress Notes (Signed)
[facility] Adult Case Management Discharge Plan :  Will you be returning to the same living situation after discharge: Yes,  pt was homeless prior to admission and will continue to be homeless at discharge.  Pt is familiar with resources in the community and will work on getting into [employer] today.  At discharge, do you have transportation home?:Yes,  provided pt with bus passes Do you have the ability to pay for your medications:Yes,  access to meds  Release of information consent forms completed and in the chart;  Patient's signature needed at discharge.  Patient to Follow up at: Follow-up Information   Follow up with [facility] On 03/11/2013. (Walk in on this date for hospital discharge appointment. Walk in clinic is Monday - Friday 8 am - 3 pm. They will then schedule you for medication management and therapy.)    Contact information:   [address], [place] [address] Phone: [phone] Fax: [phone]      Patient denies SI/HI:   Yes,  denies SI/HI    Safety Planning and Suicide Prevention discussed:  Yes,  discussed with pt, pt refused consent to provide with family or friend.  See suicide prevention education note.   Carmina Miller 03/07/2013, 10:18 AM

## 2013-03-09 ENCOUNTER — Emergency Department (HOSPITAL_COMMUNITY)
Admission: EM | Admit: 2013-03-09 | Discharge: 2013-03-09 | Disposition: A | Discharge status: Home / Self Care | Payer: Medicaid Other | Attending: Emergency Medicine | Admitting: Emergency Medicine

## 2013-03-09 ENCOUNTER — Emergency Department (HOSPITAL_COMMUNITY): Payer: Medicaid Other

## 2013-03-09 ENCOUNTER — Encounter (HOSPITAL_COMMUNITY): Payer: Self-pay | Admitting: Emergency Medicine

## 2013-03-09 DIAGNOSIS — Y939 Activity, unspecified: Secondary | ICD-10-CM | POA: Insufficient documentation

## 2013-03-09 DIAGNOSIS — Z8719 Personal history of other diseases of the digestive system: Secondary | ICD-10-CM | POA: Insufficient documentation

## 2013-03-09 DIAGNOSIS — F17200 Nicotine dependence, unspecified, uncomplicated: Secondary | ICD-10-CM | POA: Insufficient documentation

## 2013-03-09 DIAGNOSIS — M25561 Pain in right knee: Secondary | ICD-10-CM

## 2013-03-09 DIAGNOSIS — X500XXA Overexertion from strenuous movement or load, initial encounter: Secondary | ICD-10-CM | POA: Insufficient documentation

## 2013-03-09 DIAGNOSIS — S8990XA Unspecified injury of unspecified lower leg, initial encounter: Secondary | ICD-10-CM | POA: Insufficient documentation

## 2013-03-09 DIAGNOSIS — Z791 Long term (current) use of non-steroidal anti-inflammatories (NSAID): Secondary | ICD-10-CM | POA: Insufficient documentation

## 2013-03-09 DIAGNOSIS — Z8669 Personal history of other diseases of the nervous system and sense organs: Secondary | ICD-10-CM | POA: Insufficient documentation

## 2013-03-09 DIAGNOSIS — E039 Hypothyroidism, unspecified: Secondary | ICD-10-CM | POA: Insufficient documentation

## 2013-03-09 DIAGNOSIS — F411 Generalized anxiety disorder: Secondary | ICD-10-CM | POA: Insufficient documentation

## 2013-03-09 DIAGNOSIS — Y929 Unspecified place or not applicable: Secondary | ICD-10-CM | POA: Insufficient documentation

## 2013-03-09 DIAGNOSIS — F4310 Post-traumatic stress disorder, unspecified: Secondary | ICD-10-CM | POA: Insufficient documentation

## 2013-03-09 DIAGNOSIS — Z79899 Other long term (current) drug therapy: Secondary | ICD-10-CM | POA: Insufficient documentation

## 2013-03-09 DIAGNOSIS — F329 Major depressive disorder, single episode, unspecified: Secondary | ICD-10-CM | POA: Insufficient documentation

## 2013-03-09 MED ORDER — IBUPROFEN 800 MG PO TABS: 800.0000 mg | ORAL_TABLET | Freq: Three times a day (TID) | ORAL | Status: DC

## 2013-03-09 MED ORDER — KETOROLAC TROMETHAMINE 60 MG/2ML IM SOLN
60.0000 mg | Freq: Once | INTRAMUSCULAR | Status: AC
  Administered 2013-03-09: 60 mg via INTRAMUSCULAR
  Filled 2013-03-09: qty 2

## 2013-03-09 NOTE — ED Notes (Signed)
Pt reports that he tore the meniscus in his right knee awhile back. States that he tripped yesterday and twisted the same knee. Reports increased pain today and brace is not helping.

## 2013-03-09 NOTE — ED Provider Notes (Signed)
CSN: [id number]     Arrival date & time 03/09/13  1054 History  This chart was scribed for non-physician practitioner, Irish Elders, NP working with Bonnita Levan. Bernette Mayers, MD by Greggory Stallion, ED scribe. This patient was seen in room TR07C/TR07C and the patient's care was started at 12:07 PM.   Chief Complaint  Patient presents with  . Knee Pain   The history is provided by the patient. No language interpreter was used.   HPI Comments: Edward [name] is a 52 y.o. male who presents to the Emergency Department complaining of sudden onset, worsening right knee pain that started yesterday. Pt states he tripped and twisted his knee. straightening his knee worsens the pain. He states he has torn the meniscus in his right knee in the past and has torn it 3 times in the last year. Pt originally tore it when he stepped in a pothole. He has taken ibuprofen with little relief.  Past Medical History  Diagnosis Date  . Thyroid disease   . Hypothyroidism   . Mental disorder   . Depression   . Anxiety and depression   . Major depression   . PTSD (post-traumatic stress disorder)   . Hepatitis     hep C  . Meniscus tear 02/28/2013    Right side  . Neuromuscular disorder     pinched nerve (on 02/28/13 pt denies having this problem)   Past Surgical History  Procedure Laterality Date  . No past surgeries    . Testiclar cyst excision     History reviewed. No pertinent family history. History  Substance Use Topics  . Smoking status: Current Every Day Smoker -- 0.25 packs/day for 7 years    Types: Cigarettes  . Smokeless tobacco: Not on file  . Alcohol Use: 3.0 oz/week    5 Cans of beer per week     Comment: case beer daily    Review of Systems  Musculoskeletal: Positive for arthralgias.  All other systems reviewed and are negative.    Allergies  Benadryl; Tylenol; and Vistaril  Home Medications   Current Outpatient Rx  Name  Route  Sig  Dispense  Refill  . ARIPiprazole (ABILIFY) 10 MG  tablet   Oral   Take 1 tablet (10 mg total) by mouth 2 (two) times daily. For psychosis and mood stabilization and depression.   60 tablet   0   . celecoxib (CELEBREX) 200 MG capsule   Oral   Take 1 capsule (200 mg total) by mouth daily. For joint pain.   30 capsule   0   . clonazePAM (KLONOPIN) 0.5 MG tablet   Oral   Take 1 tablet (0.5 mg total) by mouth 3 (three) times daily as needed (for agitation).   15 tablet   0   . gabapentin (NEURONTIN) 600 MG tablet   Oral   Take 1 tablet (600 mg total) by mouth 3 (three) times daily. Anxiety and neuropathic pain.   90 tablet   0   . Glucosamine-Chondroit-Vit C-Mn (GLUCOSAMINE-CHONDROITIN) CAPS   Oral   Take 1 capsule by mouth every morning. Take one a day for joint pain.   30 capsule   0   . ibuprofen (ADVIL,MOTRIN) 800 MG tablet   Oral   Take 800 mg by mouth every 8 (eight) hours as needed.         Marland Kitchen levothyroxine (SYNTHROID, LEVOTHROID) 100 MCG tablet   Oral   Take 1 tablet (100 mcg total) by mouth every  morning. For thyroid disease.   30 tablet   0   . mirtazapine (REMERON) 7.5 MG tablet   Oral   Take 1 tablet (7.5 mg total) by mouth at bedtime.   30 tablet   0   . Multiple Vitamins-Minerals (CENTRUM PO)   Oral   Take 1 tablet by mouth daily as needed (Nutritional supplementation).         Marland Kitchen PARoxetine (PAXIL) 20 MG tablet   Oral   Take 40 mg by mouth daily.          BP 125/79  Pulse 63  Temp(Src) 98.2 F (36.8 C) (Oral)  Resp 18  Ht 5\' 10"  (1.778 m)  Wt 180 lb (81.647 kg)  BMI 25.83 kg/m2  SpO2 98%  Physical Exam  Nursing note and vitals reviewed. Constitutional: He is oriented to person, place, and time. He appears well-developed and well-nourished. No distress.  HENT:  Head: Normocephalic and atraumatic.  Eyes: EOM are normal.  Neck: Neck supple. No tracheal deviation present.  Cardiovascular: Normal rate.   Pulmonary/Chest: Effort normal. No respiratory distress.  Musculoskeletal:   Limited ROM of right knee due to pain. No obvious swelling. Tender to palpation medially.   Neurological: He is alert and oriented to person, place, and time.  Skin: Skin is warm and dry.  Psychiatric: He has a normal mood and affect. His behavior is normal.    ED Course  Procedures (including critical care time)  DIAGNOSTIC STUDIES: Oxygen Saturation is 98% on RA, normal by my interpretation.    COORDINATION OF CARE: 12:09 PM-Discussed treatment plan which includes ibuprofen with pt at bedside and pt agreed to plan.   Labs Review Labs Reviewed - No data to display Imaging Review Dg Knee Complete 4 Views Right  03/09/2013   CLINICAL DATA:  Medial knee pain.  EXAM: RIGHT KNEE - COMPLETE 4+ VIEW  COMPARISON:  04/29/2012  FINDINGS: There is a large osteochondral lesion of the medial femoral condyle measuring at least 2.5 x 4.1 cm. The cortex is disrupted, which is new. There is a small knee effusion. There is a calcified loose body anterior aspect of the joint.  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   Electronically Signed   By: Geanie Cooley M.D.   On: 03/09/2013 12:00    EKG Interpretation   None       MDM   1. Knee pain, right     Knee pain with old injury. Knee x-ray with new changes; osteochondral lesion with cortical disruption and a loose body. Pt tolerating ambulation, primarily wanted something to ease the pain. He reports that he is in rehab and will only take ibuprofen due to previous problems with addiction. Referred to Ortho and pt placed in knee immobilizer. No numbness or tingling. Limited ROM due to pain level. No obvious edema or effusion.    I personally performed the services described in this documentation, which was scribed in my presence. The recorded information has been reviewed and is accurate.   Irish Elders, NP 03/09/13 [phone]

## 2013-03-09 NOTE — Progress Notes (Signed)
   CARE MANAGEMENT ED NOTE 03/09/2013  Patient:  SHAKIR, PETROSINO   Account Number:  [id number]  Date Initiated:  03/09/2013  Documentation initiated by:  Fransico Michael  Subjective/Objective Assessment:   presented to ED with c/o knee pain     Subjective/Objective Assessment Detail:     Action/Plan:   needs assistance with OTC   Action/Plan Detail:   Anticipated DC Date:  03/09/2013     Status Recommendation to Physician:   Result of Recommendation:      DC Planning Services  CM consult  Medication Assistance    Choice offered to / List presented to:            Status of service:  Completed, signed off  ED Comments:   ED Comments Detail:  03/09/13-1241-J.Kyah Buesing,RN,BSN [phone]      Received call from Chris,RN regarding patient need for assistance obtaining over the counter medications. [facility] notified nurse that there are no assistance programs for otc assistance. No other needs identified at this time.

## 2013-03-11 NOTE — ED Provider Notes (Signed)
Medical screening examination/treatment/procedure(s) were performed by non-physician practitioner and as supervising physician I was immediately available for consultation/collaboration.  EKG Interpretation   None         Charles B. Bernette Mayers, MD 03/11/13 0700

## 2013-03-12 NOTE — Progress Notes (Signed)
Patient Discharge Instructions:  After Visit Summary (AVS):   Faxed to:  03/12/13 Psychiatric Admission Assessment Note:   Faxed to:  03/12/13 Suicide Risk Assessment - Discharge Assessment:   Faxed to:  03/12/13 Faxed/Sent to the Next Level Care provider:  03/12/13 Faxed to [facility] @ [phone]  Jerelene Redden, 03/12/2013, 4:28 PM

## 2013-03-17 ENCOUNTER — Emergency Department (HOSPITAL_COMMUNITY)
Admission: EM | Admit: 2013-03-17 | Discharge: 2013-03-17 | Disposition: A | Discharge status: Home / Self Care | Payer: Medicaid Other | Attending: Emergency Medicine | Admitting: Emergency Medicine

## 2013-03-17 ENCOUNTER — Encounter (HOSPITAL_COMMUNITY): Payer: Self-pay | Admitting: Emergency Medicine

## 2013-03-17 DIAGNOSIS — R45851 Suicidal ideations: Secondary | ICD-10-CM | POA: Insufficient documentation

## 2013-03-17 DIAGNOSIS — Z79899 Other long term (current) drug therapy: Secondary | ICD-10-CM | POA: Insufficient documentation

## 2013-03-17 DIAGNOSIS — F4310 Post-traumatic stress disorder, unspecified: Secondary | ICD-10-CM | POA: Insufficient documentation

## 2013-03-17 DIAGNOSIS — F17200 Nicotine dependence, unspecified, uncomplicated: Secondary | ICD-10-CM | POA: Insufficient documentation

## 2013-03-17 DIAGNOSIS — F1210 Cannabis abuse, uncomplicated: Secondary | ICD-10-CM | POA: Insufficient documentation

## 2013-03-17 DIAGNOSIS — S8990XA Unspecified injury of unspecified lower leg, initial encounter: Secondary | ICD-10-CM | POA: Insufficient documentation

## 2013-03-17 DIAGNOSIS — IMO0002 Reserved for concepts with insufficient information to code with codable children: Secondary | ICD-10-CM | POA: Insufficient documentation

## 2013-03-17 DIAGNOSIS — E039 Hypothyroidism, unspecified: Secondary | ICD-10-CM | POA: Insufficient documentation

## 2013-03-17 DIAGNOSIS — Z8619 Personal history of other infectious and parasitic diseases: Secondary | ICD-10-CM | POA: Insufficient documentation

## 2013-03-17 DIAGNOSIS — F411 Generalized anxiety disorder: Secondary | ICD-10-CM | POA: Insufficient documentation

## 2013-03-17 DIAGNOSIS — F1410 Cocaine abuse, uncomplicated: Secondary | ICD-10-CM | POA: Insufficient documentation

## 2013-03-17 DIAGNOSIS — G709 Myoneural disorder, unspecified: Secondary | ICD-10-CM | POA: Insufficient documentation

## 2013-03-17 DIAGNOSIS — R450 Nervousness: Secondary | ICD-10-CM | POA: Insufficient documentation

## 2013-03-17 DIAGNOSIS — M549 Dorsalgia, unspecified: Secondary | ICD-10-CM

## 2013-03-17 DIAGNOSIS — F329 Major depressive disorder, single episode, unspecified: Secondary | ICD-10-CM | POA: Insufficient documentation

## 2013-03-17 DIAGNOSIS — F1010 Alcohol abuse, uncomplicated: Secondary | ICD-10-CM | POA: Insufficient documentation

## 2013-03-17 LAB — URINALYSIS, ROUTINE W REFLEX MICROSCOPIC
Bilirubin Urine: NEGATIVE
Ketones, ur: NEGATIVE mg/dL
Leukocytes, UA: NEGATIVE
Nitrite: NEGATIVE
Protein, ur: 30 mg/dL — AB
Urobilinogen, UA: 0.2 mg/dL (ref 0.0–1.0)
pH: 5.5 (ref 5.0–8.0)

## 2013-03-17 LAB — COMPREHENSIVE METABOLIC PANEL
ALT: 268 U/L — ABNORMAL HIGH (ref 0–53)
AST: 203 U/L — ABNORMAL HIGH (ref 0–37)
Alkaline Phosphatase: 84 U/L (ref 39–117)
CO2: 24 mEq/L (ref 19–32)
Chloride: 103 mEq/L (ref 96–112)
GFR calc Af Amer: >90 mL/min (ref >90)
Glucose, Bld: 92 mg/dL (ref 70–99)
Potassium: 4.5 mEq/L (ref 3.5–5.1)
Sodium: 138 mEq/L (ref 135–145)
Total Bilirubin: 0.4 mg/dL (ref 0.3–1.2)
Total Protein: 8.5 g/dL — ABNORMAL HIGH (ref 6.0–8.3)

## 2013-03-17 LAB — RAPID URINE DRUG SCREEN, HOSP PERFORMED
Barbiturates: NOT DETECTED
Benzodiazepines: NOT DETECTED
Tetrahydrocannabinol: POSITIVE — AB

## 2013-03-17 LAB — CBC
Hemoglobin: 14.3 g/dL (ref 13.0–17.0)
MCHC: 35.3 g/dL (ref 30.0–36.0)
RBC: 4.54 MIL/uL (ref 4.22–5.81)

## 2013-03-17 LAB — URINE MICROSCOPIC-ADD ON

## 2013-03-17 LAB — ETHANOL: Alcohol, Ethyl (B): 151 mg/dL — ABNORMAL HIGH (ref 0–11)

## 2013-03-17 MED ORDER — KETOROLAC TROMETHAMINE 30 MG/ML IJ SOLN
30.0000 mg | Freq: Once | INTRAMUSCULAR | Status: AC
  Administered 2013-03-17: 30 mg via INTRAMUSCULAR
  Filled 2013-03-17: qty 1

## 2013-03-17 MED ORDER — CLONAZEPAM 0.5 MG PO TABS: 0.5000 mg | ORAL_TABLET | Freq: Three times a day (TID) | ORAL | Status: DC | PRN

## 2013-03-17 MED ORDER — CELECOXIB 200 MG PO CAPS
200.0000 mg | ORAL_CAPSULE | Freq: Every day | ORAL | Status: DC
  Administered 2013-03-17: 200 mg via ORAL
  Filled 2013-03-17: qty 1

## 2013-03-17 MED ORDER — PAROXETINE HCL 20 MG PO TABS
40.0000 mg | ORAL_TABLET | Freq: Every day | ORAL | Status: DC
  Administered 2013-03-17: 40 mg via ORAL
  Filled 2013-03-17: qty 2

## 2013-03-17 MED ORDER — LEVOTHYROXINE SODIUM 100 MCG PO TABS
100.0000 ug | ORAL_TABLET | Freq: Every morning | ORAL | Status: DC
  Administered 2013-03-17: 100 ug via ORAL
  Filled 2013-03-17: qty 1

## 2013-03-17 MED ORDER — ARIPIPRAZOLE 10 MG PO TABS
10.0000 mg | ORAL_TABLET | Freq: Two times a day (BID) | ORAL | Status: DC
  Administered 2013-03-17 (×2): 10 mg via ORAL
  Filled 2013-03-17 (×3): qty 1

## 2013-03-17 MED ORDER — MIRTAZAPINE 7.5 MG PO TABS
7.5000 mg | ORAL_TABLET | Freq: Every day | ORAL | Status: DC
  Filled 2013-03-17 (×2): qty 1

## 2013-03-17 MED ORDER — IBUPROFEN 800 MG PO TABS
800.0000 mg | ORAL_TABLET | Freq: Four times a day (QID) | ORAL | Status: DC | PRN
  Administered 2013-03-17: 800 mg via ORAL
  Filled 2013-03-17 (×2): qty 1

## 2013-03-17 NOTE — ED Notes (Signed)
Sitter at bedside.

## 2013-03-17 NOTE — BH Assessment (Signed)
TA scheduled for 0830. Writer contacted EDP-Dr. Jeraldine Loots for clinicals prior to seeing this patient.

## 2013-03-17 NOTE — BH Assessment (Signed)
Discharge per Verne Spurr, NP who ran this patient by Dr. Elsie Saas. Per Lloyd Huger patient is chronic and does not meet criteria for inpatient hospitalization. She recommends outpatient follow up. Writer discussed this with EDP-Dr. Hyacinth Meeker and also with patient's nurse.   Writer asked [facility] to assist in faxing outpatient referrals to this patient's nurse. This information will be provided to patient prior to his discharge from [facility].

## 2013-03-17 NOTE — ED Notes (Signed)
Notified AC and Charge for sitter need for SI.
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
## 2013-03-17 NOTE — Consult Note (Signed)
[id number]   May 24, 1960    [name]  This patient was presented for possible admission to [facility] due to suicidal ideation and polysubstance abuse.  The chart was reviewed as well as the TTS assessment completed by our TTS worker. Jaquin is well known to the [facility] staff and has just been discharged from his 5th admission in the past 13 months at [facility].  He is declined at [facility] as he has shown poor motivation for recovery, non-compliance with follow up planning, poor participation in unit programming during his admission, and continued med seeking behaviors. He was also felt to be malingering as well. This is discussed with Dr. Elsie Saas who recommends that the patient be discharged from the ED with recommendations for out patient follow up in the community.  Rona Ravens.[facility] 03/17/2013 4:37 PM  Case discussed with physician extender and patient was recently released from the behavioral health [facility]. Reviewed the information documented and agree with the treatment plan.  Ranen Doolin,JANARDHAHA R. 03/17/2013 6:09 PM

## 2013-03-17 NOTE — Clinical Note (Incomplete)
Discharge home per Verne Spurr, NP. Writer has called EDP to discuss this recommended disposition. EDP asked this writer to call back
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
## 2013-03-17 NOTE — ED Notes (Signed)
Sitter was sent from house coverage and is at bedside

## 2013-03-17 NOTE — ED Notes (Signed)
Patient given bus pass and tech walked with patient to ED lobby for discharge.
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
## 2013-03-17 NOTE — ED Notes (Signed)
MD at bedside. 

## 2013-03-17 NOTE — ED Notes (Signed)
Pt states that he is going to go home and overdose. Act team is aware. Pt states he does not want to be discharged.

## 2013-03-17 NOTE — ED Notes (Signed)
Pt informed that he will be reassessed.

## 2013-03-17 NOTE — ED Notes (Signed)
Security at bedside. Pt wanded and cleared. 

## 2013-03-24 ENCOUNTER — Encounter (HOSPITAL_COMMUNITY): Payer: Self-pay | Admitting: Emergency Medicine

## 2013-03-24 ENCOUNTER — Emergency Department (HOSPITAL_COMMUNITY)
Admission: EM | Admit: 2013-03-24 | Discharge: 2013-03-24 | Disposition: A | Discharge status: Home / Self Care | Payer: Medicaid Other | Attending: Emergency Medicine | Admitting: Emergency Medicine

## 2013-03-24 ENCOUNTER — Emergency Department (HOSPITAL_COMMUNITY): Payer: Medicaid Other

## 2013-03-24 DIAGNOSIS — Y929 Unspecified place or not applicable: Secondary | ICD-10-CM | POA: Insufficient documentation

## 2013-03-24 DIAGNOSIS — X500XXA Overexertion from strenuous movement or load, initial encounter: Secondary | ICD-10-CM | POA: Insufficient documentation

## 2013-03-24 DIAGNOSIS — F17200 Nicotine dependence, unspecified, uncomplicated: Secondary | ICD-10-CM | POA: Insufficient documentation

## 2013-03-24 DIAGNOSIS — F489 Nonpsychotic mental disorder, unspecified: Secondary | ICD-10-CM | POA: Insufficient documentation

## 2013-03-24 DIAGNOSIS — F329 Major depressive disorder, single episode, unspecified: Secondary | ICD-10-CM | POA: Insufficient documentation

## 2013-03-24 DIAGNOSIS — E039 Hypothyroidism, unspecified: Secondary | ICD-10-CM | POA: Insufficient documentation

## 2013-03-24 DIAGNOSIS — Z888 Allergy status to other drugs, medicaments and biological substances status: Secondary | ICD-10-CM | POA: Insufficient documentation

## 2013-03-24 DIAGNOSIS — Z8719 Personal history of other diseases of the digestive system: Secondary | ICD-10-CM | POA: Insufficient documentation

## 2013-03-24 DIAGNOSIS — Y9301 Activity, walking, marching and hiking: Secondary | ICD-10-CM | POA: Insufficient documentation

## 2013-03-24 DIAGNOSIS — S8991XA Unspecified injury of right lower leg, initial encounter: Secondary | ICD-10-CM

## 2013-03-24 DIAGNOSIS — F341 Dysthymic disorder: Secondary | ICD-10-CM | POA: Insufficient documentation

## 2013-03-24 DIAGNOSIS — F4310 Post-traumatic stress disorder, unspecified: Secondary | ICD-10-CM | POA: Insufficient documentation

## 2013-03-24 DIAGNOSIS — S8990XA Unspecified injury of unspecified lower leg, initial encounter: Secondary | ICD-10-CM | POA: Insufficient documentation

## 2013-03-24 DIAGNOSIS — Z8739 Personal history of other diseases of the musculoskeletal system and connective tissue: Secondary | ICD-10-CM | POA: Insufficient documentation

## 2013-03-24 DIAGNOSIS — Z79899 Other long term (current) drug therapy: Secondary | ICD-10-CM | POA: Insufficient documentation

## 2013-03-24 MED ORDER — OXYCODONE-ACETAMINOPHEN 5-325 MG PO TABS
1.0000 | ORAL_TABLET | Freq: Once | ORAL | Status: AC
  Administered 2013-03-24: 1 via ORAL
  Filled 2013-03-24: qty 1

## 2013-03-24 MED ORDER — NAPROXEN 500 MG PO TABS: 500.0000 mg | ORAL_TABLET | Freq: Two times a day (BID) | ORAL | Status: DC

## 2013-03-24 NOTE — ED Notes (Signed)
Pt. arrived with [facility] from a homeless shelter reports right knee pain after accidentally stepping on a pot hole last Sunday , ambulatory .

## 2013-03-24 NOTE — ED Provider Notes (Signed)
CSN: [id number]     Arrival date & time 03/24/13  0308 History   First MD Initiated Contact with Patient 03/24/13 0355     Chief Complaint  Patient presents with  . Knee Pain   (Consider location/radiation/quality/duration/timing/severity/associated sxs/prior Treatment) HPI Patient reports he was walking yesterday and stepped into a hole twisting his knee.  He reports pain in his right knee over the past 24 hours.  He is a long-standing history of pain in his right knee and used to follow with an orthopedic surgeon in [place].  He does not have an orthopedic surgeon here.  His pain is mild in severity.  His pain is worse with ambulation and range of motion of his right knee.  No fevers or chills.  No redness.  No other complaints.   Past Medical History  Diagnosis Date  . Thyroid disease   . Hypothyroidism   . Mental disorder   . Depression   . Anxiety and depression   . Major depression   . PTSD (post-traumatic stress disorder)   . Hepatitis     hep C  . Meniscus tear 02/28/2013    Right side  . Neuromuscular disorder     pinched nerve (on 02/28/13 pt denies having this problem)   Past Surgical History  Procedure Laterality Date  . No past surgeries    . Testiclar cyst excision     No family history on file. History  Substance Use Topics  . Smoking status: Current Every Day Smoker -- 0.25 packs/day for 7 years    Types: Cigarettes  . Smokeless tobacco: Not on file  . Alcohol Use: 3.0 oz/week    5 Cans of beer per week     Comment: case beer daily    Review of Systems  All other systems reviewed and are negative.    Allergies  Benadryl; Motrin; Tylenol; and Vistaril  Home Medications   Current Outpatient Rx  Name  Route  Sig  Dispense  Refill  . ARIPiprazole (ABILIFY) 10 MG tablet   Oral   Take 1 tablet (10 mg total) by mouth 2 (two) times daily. For psychosis and mood stabilization and depression.   60 tablet   0   . celecoxib (CELEBREX) 200 MG capsule  Oral   Take 1 capsule (200 mg total) by mouth daily. For joint pain.   30 capsule   0   . gabapentin (NEURONTIN) 600 MG tablet   Oral   Take 1 tablet (600 mg total) by mouth 3 (three) times daily. Anxiety and neuropathic pain.   90 tablet   0   . ibuprofen (ADVIL,MOTRIN) 800 MG tablet   Oral   Take 1 tablet (800 mg total) by mouth 3 (three) times daily.   21 tablet   0   . levothyroxine (SYNTHROID, LEVOTHROID) 100 MCG tablet   Oral   Take 1 tablet (100 mcg total) by mouth every morning. For thyroid disease.   30 tablet   0   . mirtazapine (REMERON) 7.5 MG tablet   Oral   Take 7.5 mg by mouth at bedtime.          Marland Kitchen PARoxetine (PAXIL) 20 MG tablet   Oral   Take 40 mg by mouth daily.         . naproxen (NAPROSYN) 500 MG tablet   Oral   Take 1 tablet (500 mg total) by mouth 2 (two) times daily.   20 tablet   0  BP 118/86  Pulse 77  Temp(Src) 98.1 F (36.7 C) (Oral)  Resp 18  Ht 5\' 10"  (1.778 m)  Wt 185 lb 4.8 oz (84.052 kg)  BMI 26.59 kg/m2  SpO2 96% Physical Exam  Nursing note and vitals reviewed. Constitutional: He is oriented to person, place, and time. He appears well-developed and well-nourished.  HENT:  Head: Normocephalic.  Eyes: EOM are normal.  Neck: Normal range of motion.  Pulmonary/Chest: Effort normal.  Abdominal: He exhibits no distension.  Musculoskeletal: Normal range of motion.  Mild tenderness to palpation of his right medial knee jointline.  No deformity.  Normal PT and DP pulses in his right foot.  No significant right knee joint effusion  Neurological: He is alert and oriented to person, place, and time.  Psychiatric: He has a normal mood and affect.    ED Course  Procedures (including critical care time) Labs Review Labs Reviewed - No data to display Imaging Review Dg Knee Complete 4 Views Right  03/24/2013   *RADIOLOGY REPORT*  Clinical Data: Knee pain  RIGHT KNEE - COMPLETE 4+ VIEW  Comparison: 03/09/2013  Findings:  Advanced medial compartment degenerative changes with osteophyte formation and osteochondral defect.  Mild lateral compartment DJD.  Small joint effusion.  IMPRESSION: Advanced medial compartment DJD with osteophyte formation and osteochondral lesion, similar to recent prior.  Small joint effusion.   Original Report Authenticated By: Jearld Lesch, M.D.  I personally reviewed the imaging tests through PACS system I reviewed available ER/hospitalization records through the EMR   EKG Interpretation   None       MDM   1. Right knee injury, initial encounter    Osteoarthritis right knee.  Discharge home with orthopedic followup.    Lyanne Co, MD 03/24/13 [phone]

## 2013-03-27 ENCOUNTER — Inpatient Hospital Stay (HOSPITAL_COMMUNITY)
Admission: AD | Admit: 2013-03-27 | Discharge: 2013-04-02 | DRG: 897 | Disposition: A | Discharge status: Home / Self Care | Payer: Medicaid Other | Source: Intra-hospital | Attending: Psychiatry | Admitting: Psychiatry

## 2013-03-27 ENCOUNTER — Encounter (HOSPITAL_COMMUNITY): Payer: Self-pay | Admitting: Emergency Medicine

## 2013-03-27 ENCOUNTER — Encounter (HOSPITAL_COMMUNITY): Payer: Self-pay

## 2013-03-27 ENCOUNTER — Emergency Department (HOSPITAL_COMMUNITY)
Admission: EM | Admit: 2013-03-27 | Discharge: 2013-03-27 | Disposition: A | Discharge status: Other Acute Inpatient Hospital | Payer: Medicaid Other | Attending: Emergency Medicine | Admitting: Emergency Medicine

## 2013-03-27 DIAGNOSIS — F1010 Alcohol abuse, uncomplicated: Secondary | ICD-10-CM | POA: Diagnosis present

## 2013-03-27 DIAGNOSIS — F4310 Post-traumatic stress disorder, unspecified: Secondary | ICD-10-CM | POA: Diagnosis present

## 2013-03-27 DIAGNOSIS — F1120 Opioid dependence, uncomplicated: Secondary | ICD-10-CM | POA: Diagnosis present

## 2013-03-27 DIAGNOSIS — Z8619 Personal history of other infectious and parasitic diseases: Secondary | ICD-10-CM | POA: Insufficient documentation

## 2013-03-27 DIAGNOSIS — Z9289 Personal history of other medical treatment: Secondary | ICD-10-CM

## 2013-03-27 DIAGNOSIS — F1994 Other psychoactive substance use, unspecified with psychoactive substance-induced mood disorder: Secondary | ICD-10-CM | POA: Insufficient documentation

## 2013-03-27 DIAGNOSIS — Z79899 Other long term (current) drug therapy: Secondary | ICD-10-CM

## 2013-03-27 DIAGNOSIS — Z87828 Personal history of other (healed) physical injury and trauma: Secondary | ICD-10-CM | POA: Insufficient documentation

## 2013-03-27 DIAGNOSIS — R45851 Suicidal ideations: Secondary | ICD-10-CM

## 2013-03-27 DIAGNOSIS — F1910 Other psychoactive substance abuse, uncomplicated: Secondary | ICD-10-CM

## 2013-03-27 DIAGNOSIS — F17200 Nicotine dependence, unspecified, uncomplicated: Secondary | ICD-10-CM | POA: Insufficient documentation

## 2013-03-27 DIAGNOSIS — B1920 Unspecified viral hepatitis C without hepatic coma: Secondary | ICD-10-CM | POA: Diagnosis present

## 2013-03-27 DIAGNOSIS — E039 Hypothyroidism, unspecified: Secondary | ICD-10-CM | POA: Diagnosis present

## 2013-03-27 DIAGNOSIS — Z8669 Personal history of other diseases of the nervous system and sense organs: Secondary | ICD-10-CM | POA: Insufficient documentation

## 2013-03-27 DIAGNOSIS — F1110 Opioid abuse, uncomplicated: Secondary | ICD-10-CM | POA: Insufficient documentation

## 2013-03-27 DIAGNOSIS — F1410 Cocaine abuse, uncomplicated: Secondary | ICD-10-CM | POA: Insufficient documentation

## 2013-03-27 DIAGNOSIS — F1020 Alcohol dependence, uncomplicated: Principal | ICD-10-CM | POA: Diagnosis present

## 2013-03-27 DIAGNOSIS — F329 Major depressive disorder, single episode, unspecified: Secondary | ICD-10-CM | POA: Diagnosis present

## 2013-03-27 LAB — ETHANOL: Alcohol, Ethyl (B): 93 mg/dL — ABNORMAL HIGH (ref 0–11)

## 2013-03-27 LAB — COMPREHENSIVE METABOLIC PANEL
ALT: 256 U/L — ABNORMAL HIGH (ref 0–53)
AST: 196 U/L — ABNORMAL HIGH (ref 0–37)
Alkaline Phosphatase: 85 U/L (ref 39–117)
CO2: 25 mEq/L (ref 19–32)
Chloride: 96 mEq/L (ref 96–112)
Creatinine, Ser: 0.76 mg/dL (ref 0.50–1.35)
GFR calc Af Amer: >90 mL/min (ref >90)
GFR calc non Af Amer: >90 mL/min (ref >90)
Glucose, Bld: 87 mg/dL (ref 70–99)
Sodium: 132 mEq/L — ABNORMAL LOW (ref 135–145)
Total Bilirubin: 0.6 mg/dL (ref 0.3–1.2)

## 2013-03-27 LAB — SALICYLATE LEVEL: Salicylate Lvl: <2 mg/dL — ABNORMAL LOW (ref 2.8–20.0)

## 2013-03-27 LAB — CBC
Hemoglobin: 12.6 g/dL — ABNORMAL LOW (ref 13.0–17.0)
MCV: 87.4 fL (ref 78.0–100.0)
Platelets: 150 10*3/uL (ref 150–400)
RBC: 4.14 MIL/uL — ABNORMAL LOW (ref 4.22–5.81)
RDW: 13.4 % (ref 11.5–15.5)
WBC: 5.9 10*3/uL (ref 4.0–10.5)

## 2013-03-27 LAB — RAPID URINE DRUG SCREEN, HOSP PERFORMED
Amphetamines: NOT DETECTED
Barbiturates: NOT DETECTED
Cocaine: POSITIVE — AB
Tetrahydrocannabinol: POSITIVE — AB

## 2013-03-27 LAB — ACETAMINOPHEN LEVEL: Acetaminophen (Tylenol), Serum: <15 ug/mL (ref 10–30)

## 2013-03-27 MED ORDER — CHLORDIAZEPOXIDE HCL 25 MG PO CAPS
25.0000 mg | ORAL_CAPSULE | Freq: Once | ORAL | Status: AC
  Administered 2013-03-27: 25 mg via ORAL
  Filled 2013-03-27: qty 1

## 2013-03-27 MED ORDER — DICYCLOMINE HCL 20 MG PO TABS: 20.0000 mg | ORAL_TABLET | Freq: Four times a day (QID) | ORAL | Status: DC | PRN

## 2013-03-27 MED ORDER — THIAMINE HCL 100 MG/ML IJ SOLN: 100.0000 mg | Freq: Once | INTRAMUSCULAR | Status: DC

## 2013-03-27 MED ORDER — CLONIDINE HCL 0.1 MG PO TABS
0.1000 mg | ORAL_TABLET | Freq: Four times a day (QID) | ORAL | Status: AC
  Administered 2013-03-28 – 2013-03-29 (×6): 0.1 mg via ORAL
  Filled 2013-03-27 (×13): qty 1

## 2013-03-27 MED ORDER — LORAZEPAM 1 MG PO TABS
1.0000 mg | ORAL_TABLET | Freq: Three times a day (TID) | ORAL | Status: DC | PRN
  Administered 2013-03-27: 1 mg via ORAL
  Filled 2013-03-27: qty 1

## 2013-03-27 MED ORDER — IBUPROFEN 200 MG PO TABS: 600.0000 mg | ORAL_TABLET | Freq: Three times a day (TID) | ORAL | Status: DC | PRN

## 2013-03-27 MED ORDER — CHLORDIAZEPOXIDE HCL 25 MG PO CAPS: 25.0000 mg | ORAL_CAPSULE | Freq: Three times a day (TID) | ORAL | Status: DC

## 2013-03-27 MED ORDER — LOPERAMIDE HCL 2 MG PO CAPS: 2.0000 mg | ORAL_CAPSULE | ORAL | Status: AC | PRN

## 2013-03-27 MED ORDER — CHLORDIAZEPOXIDE HCL 25 MG PO CAPS
25.0000 mg | ORAL_CAPSULE | Freq: Four times a day (QID) | ORAL | Status: DC
  Administered 2013-03-27: 25 mg via ORAL
  Filled 2013-03-27: qty 1

## 2013-03-27 MED ORDER — CHLORDIAZEPOXIDE HCL 25 MG PO CAPS
25.0000 mg | ORAL_CAPSULE | ORAL | Status: DC
  Filled 2013-03-27 (×2): qty 1

## 2013-03-27 MED ORDER — GABAPENTIN 600 MG PO TABS
600.0000 mg | ORAL_TABLET | Freq: Three times a day (TID) | ORAL | Status: DC
  Administered 2013-03-27: 600 mg via ORAL
  Filled 2013-03-27 (×2): qty 1

## 2013-03-27 MED ORDER — ALUM & MAG HYDROXIDE-SIMETH 200-200-20 MG/5ML PO SUSP
30.0000 mL | ORAL | Status: DC | PRN
  Administered 2013-03-31: 30 mL via ORAL

## 2013-03-27 MED ORDER — CHLORDIAZEPOXIDE HCL 25 MG PO CAPS: 25.0000 mg | ORAL_CAPSULE | Freq: Every day | ORAL | Status: DC

## 2013-03-27 MED ORDER — ONDANSETRON HCL 4 MG PO TABS: 4.0000 mg | ORAL_TABLET | Freq: Three times a day (TID) | ORAL | Status: DC | PRN

## 2013-03-27 MED ORDER — ADULT MULTIVITAMIN W/MINERALS CH: 1.0000 | ORAL_TABLET | Freq: Every day | ORAL | Status: DC

## 2013-03-27 MED ORDER — CLONIDINE HCL 0.1 MG PO TABS
0.1000 mg | ORAL_TABLET | Freq: Every day | ORAL | Status: DC
  Filled 2013-03-27: qty 1

## 2013-03-27 MED ORDER — CLONIDINE HCL 0.1 MG PO TABS: 0.1000 mg | ORAL_TABLET | Freq: Every day | ORAL | Status: DC

## 2013-03-27 MED ORDER — LEVOTHYROXINE SODIUM 100 MCG PO TABS
100.0000 ug | ORAL_TABLET | Freq: Every day | ORAL | Status: DC
  Administered 2013-03-27: 100 ug via ORAL
  Filled 2013-03-27 (×2): qty 1

## 2013-03-27 MED ORDER — VITAMIN B-1 100 MG PO TABS
100.0000 mg | ORAL_TABLET | Freq: Every day | ORAL | Status: DC
  Administered 2013-03-28 – 2013-04-02 (×6): 100 mg via ORAL
  Filled 2013-03-27 (×8): qty 1

## 2013-03-27 MED ORDER — NICOTINE 21 MG/24HR TD PT24
21.0000 mg | MEDICATED_PATCH | Freq: Every day | TRANSDERMAL | Status: DC
  Administered 2013-03-27: 21 mg via TRANSDERMAL
  Filled 2013-03-27: qty 1

## 2013-03-27 MED ORDER — CHLORDIAZEPOXIDE HCL 25 MG PO CAPS: 25.0000 mg | ORAL_CAPSULE | ORAL | Status: DC

## 2013-03-27 MED ORDER — CHLORDIAZEPOXIDE HCL 25 MG PO CAPS
50.0000 mg | ORAL_CAPSULE | Freq: Once | ORAL | Status: DC
  Filled 2013-03-27: qty 2

## 2013-03-27 MED ORDER — MAGNESIUM HYDROXIDE 400 MG/5ML PO SUSP: 30.0000 mL | Freq: Every day | ORAL | Status: DC | PRN

## 2013-03-27 MED ORDER — LEVOTHYROXINE SODIUM 100 MCG PO TABS
100.0000 ug | ORAL_TABLET | Freq: Every day | ORAL | Status: DC
  Administered 2013-03-28 – 2013-04-02 (×6): 100 ug via ORAL
  Filled 2013-03-27 (×8): qty 1

## 2013-03-27 MED ORDER — CLONIDINE HCL 0.1 MG PO TABS
0.1000 mg | ORAL_TABLET | ORAL | Status: DC
  Filled 2013-03-27 (×4): qty 1

## 2013-03-27 MED ORDER — ZOLPIDEM TARTRATE 5 MG PO TABS: 5.0000 mg | ORAL_TABLET | Freq: Every evening | ORAL | Status: DC | PRN

## 2013-03-27 MED ORDER — CHLORDIAZEPOXIDE HCL 25 MG PO CAPS
25.0000 mg | ORAL_CAPSULE | Freq: Four times a day (QID) | ORAL | Status: AC
  Administered 2013-03-28 – 2013-03-29 (×5): 25 mg via ORAL
  Filled 2013-03-27 (×3): qty 1

## 2013-03-27 MED ORDER — CLONIDINE HCL 0.1 MG PO TABS
0.1000 mg | ORAL_TABLET | Freq: Four times a day (QID) | ORAL | Status: DC
  Administered 2013-03-27: 0.1 mg via ORAL
  Filled 2013-03-27: qty 1

## 2013-03-27 MED ORDER — DICYCLOMINE HCL 20 MG PO TABS
20.0000 mg | ORAL_TABLET | Freq: Four times a day (QID) | ORAL | Status: AC | PRN
  Filled 2013-03-27: qty 1

## 2013-03-27 MED ORDER — METHOCARBAMOL 500 MG PO TABS: 500.0000 mg | ORAL_TABLET | Freq: Three times a day (TID) | ORAL | Status: DC | PRN

## 2013-03-27 MED ORDER — LOPERAMIDE HCL 2 MG PO CAPS: 2.0000 mg | ORAL_CAPSULE | ORAL | Status: DC | PRN

## 2013-03-27 MED ORDER — VITAMIN B-1 100 MG PO TABS: 100.0000 mg | ORAL_TABLET | Freq: Every day | ORAL | Status: DC

## 2013-03-27 MED ORDER — CHLORDIAZEPOXIDE HCL 25 MG PO CAPS
25.0000 mg | ORAL_CAPSULE | Freq: Three times a day (TID) | ORAL | Status: AC
  Administered 2013-03-29 (×2): 25 mg via ORAL
  Filled 2013-03-27 (×3): qty 1

## 2013-03-27 MED ORDER — CHLORDIAZEPOXIDE HCL 25 MG PO CAPS
25.0000 mg | ORAL_CAPSULE | Freq: Four times a day (QID) | ORAL | Status: AC | PRN
  Administered 2013-03-29 – 2013-03-30 (×4): 25 mg via ORAL
  Filled 2013-03-27 (×4): qty 1

## 2013-03-27 MED ORDER — PAROXETINE HCL 20 MG PO TABS
40.0000 mg | ORAL_TABLET | Freq: Every day | ORAL | Status: DC
  Administered 2013-03-27: 40 mg via ORAL
  Filled 2013-03-27: qty 2

## 2013-03-27 MED ORDER — METHOCARBAMOL 500 MG PO TABS
500.0000 mg | ORAL_TABLET | Freq: Three times a day (TID) | ORAL | Status: AC | PRN
  Administered 2013-03-28 – 2013-04-01 (×2): 500 mg via ORAL
  Filled 2013-03-27 (×2): qty 1

## 2013-03-27 MED ORDER — ONDANSETRON 4 MG PO TBDP
4.0000 mg | ORAL_TABLET | Freq: Four times a day (QID) | ORAL | Status: AC | PRN
  Filled 2013-03-27: qty 1

## 2013-03-27 MED ORDER — CLONIDINE HCL 0.1 MG PO TABS: 0.1000 mg | ORAL_TABLET | ORAL | Status: DC

## 2013-03-27 MED ORDER — MAGNESIUM HYDROXIDE 400 MG/5ML PO SUSP
30.0000 mL | Freq: Every day | ORAL | Status: DC | PRN
  Filled 2013-03-27: qty 30

## 2013-03-27 MED ORDER — ONDANSETRON 4 MG PO TBDP: 4.0000 mg | ORAL_TABLET | Freq: Four times a day (QID) | ORAL | Status: AC | PRN

## 2013-03-27 MED ORDER — ADULT MULTIVITAMIN W/MINERALS CH
1.0000 | ORAL_TABLET | Freq: Every day | ORAL | Status: DC
  Administered 2013-03-28 – 2013-04-02 (×6): 1 via ORAL
  Filled 2013-03-27 (×8): qty 1

## 2013-03-27 MED ORDER — CHLORDIAZEPOXIDE HCL 25 MG PO CAPS: 25.0000 mg | ORAL_CAPSULE | Freq: Four times a day (QID) | ORAL | Status: DC | PRN

## 2013-03-27 MED ORDER — NAPROXEN 500 MG PO TABS
500.0000 mg | ORAL_TABLET | Freq: Two times a day (BID) | ORAL | Status: DC | PRN
  Administered 2013-03-28 – 2013-03-30 (×5): 500 mg via ORAL
  Filled 2013-03-27 (×5): qty 1

## 2013-03-27 MED ORDER — HYDROXYZINE HCL 25 MG PO TABS: 25.0000 mg | ORAL_TABLET | Freq: Four times a day (QID) | ORAL | Status: DC | PRN

## 2013-03-27 MED ORDER — ALUM & MAG HYDROXIDE-SIMETH 200-200-20 MG/5ML PO SUSP: 30.0000 mL | ORAL | Status: DC | PRN

## 2013-03-27 MED ORDER — NAPROXEN 500 MG PO TABS: 500.0000 mg | ORAL_TABLET | Freq: Two times a day (BID) | ORAL | Status: DC | PRN

## 2013-03-27 NOTE — Progress Notes (Signed)
Writer consulted with the NP (Shuvon Rankin) regarding the patient meeting criteria for inpatient hospitalization.  NP has spoken to Minerva Areola [facility]) regarding the patient receiving a bed at [facility].

## 2013-03-27 NOTE — ED Notes (Signed)
Patient admitted to room 43 from ED. Patient has a depressed mood and affect. Patient states that he "wants to detox from alcohol and drugs." Patient denies any active SI but states that he has "thought about it before." Patient verbally contracts for safety. Patient denies any needs or concerns at this time. Patient given meal and beverage upon arrival. Will continue to monitor patient for safety.

## 2013-03-27 NOTE — ED Notes (Signed)
PA at bedside.

## 2013-03-27 NOTE — ED Notes (Signed)
Pt arrived to ED with a need for medical clearance.  Pt states he had a binge of drugs and alcohol last evening to include heroin, cocaine, mariajuana, and alcohol.  Pt states that his detox from cocaine makes him "want to blow my brains out."

## 2013-03-27 NOTE — Consult Note (Signed)
  Subject:  Patient states "I set out to get messed up yesterday and I did."  Patient states that he binges on alcohol, cocaine, mariajuana, and Heroin.  Patient states that he is suicidal and plan "I'll bleed out."  Patient denies homicidal ideation.  States that he has a history of hearing voices that tell him "I'm no good and Gad hates me. But, I'm not hearing them right now."  Patient also states that he blacks out when drinking alcohol.  Patient denies psychosis at this time, and paranoia. Axis I: Alcohol Abuse, Depressive Disorder NOS, Substance Abuse and Suicidal Ideation Axis II: Deferred Axis III:  Past Medical History  Diagnosis Date  . Thyroid disease   . Hypothyroidism   . Mental disorder   . Depression   . Anxiety and depression   . Major depression   . PTSD (post-traumatic stress disorder)   . Hepatitis     hep C  . Meniscus tear 02/28/2013    Right side  . Neuromuscular disorder     pinched nerve (on 02/28/13 pt denies having this problem)   Axis IV: other psychosocial or environmental problems and problems related to social environment Axis V: 11-20 some danger of hurting self or others possible OR occasionally fails to maintain minimal personal hygiene OR gross impairment in communication  Psychiatric Specialty Exam: Physical Exam  ROS  Blood pressure 113/73, pulse 81, temperature 98.5 F (36.9 C), temperature source Oral, resp. rate 18, SpO2 95.00%.There is no weight on file to calculate BMI.  General Appearance: Disheveled  Eye Contact::  Poor  Speech:  Clear and Coherent and Normal Rate  Volume:  Normal  Mood:  Depressed and Irritable  Affect:  Blunt and Depressed  Thought Process:  Circumstantial  Orientation:  Full (Time, Place, and Person)  Thought Content:  'I need to get better"  Suicidal Thoughts:  Yes.  with intent/plan  Homicidal Thoughts:  No  Memory:  Immediate;   Good Recent;   Good  Judgement:  Poor  Insight:  Lacking  Psychomotor Activity:   Tremor  Concentration:  Fair  Recall:  Good  Akathisia:  No  Handed:  Right  AIMS (if indicated):     Assets:  Communication Skills Desire for Improvement  Sleep:      Face to face consult/interview  Disposition: Inpatient treatment/detox.  Start Librium and Clonidine Protocol for detox.  Patient accepted to [facility] 302/02  Case discussed with physician extender and reviewed the information documented and agree with the treatment plan and in patient hospitalization for crisis stabilization, safety monitoring and detox treatment.Darrol Jump R. 03/27/2013 6:21 PM

## 2013-03-27 NOTE — ED Notes (Addendum)
Pt belongings consisting of boots, pants, underwear, tee shirt, sweater vest, and long sleeve shirt are bagged, gone through by security and placed behind the nurses station in triage.  They will be taken back with the pt when he is roomed.

## 2013-03-27 NOTE — Progress Notes (Signed)
Patient has been accepted to [facility] Bed 302-2.  Writer informed the nurse working with the patient and the ER MD.  Writer faxed the support paperwork to [facility].   Per Minerva Areola, the patient can come after the 7pm shift exchange.

## 2013-03-27 NOTE — Progress Notes (Signed)
Report given to [facility] at [facility]. Patient notified of transfer. Patient denies any questions or concerns at this time. Patient was given scheduled medications with the exception of multivitamin and thiamine, which patient refused. Patient stated that he "would get them at [facility] anyway." Will continue to monitor patient for safety.

## 2013-03-27 NOTE — ED Provider Notes (Signed)
CSN: [id number]     Arrival date & time 03/27/13  1215 History  This chart was scribed for non-physician practitioner working with Provider Default, MD by Ashley Jacobs, ED scribe. This patient was seen in room [facility] and the patient's care was started at 6:49 PM.  First MD Initiated Contact with Patient 03/27/13 1435     Chief Complaint  Patient presents with  . Medical Clearance   (Consider location/radiation/quality/duration/timing/severity/associated sxs/prior Treatment) The history is provided by the patient and medical records. No language interpreter was used.   HPI Comments: Edward [name] is a 52 y.o. male who presents to the Emergency Department for Medical Clearance. Pt states he was partying last night and he "overdid it". Last night he smoked heroin, crack cocaine and drank alcohol. Pt states having SI and no suicidal plans. He does not have access to weapons or guns. He takes Gabapentin and Abilify medication while at home. He is a resident at the [facility]. Pt denies drinking alcohol but last night he "slipped up". He is experiencing pain in his right knee.  Pt has a hx of depression and PTSD.  Past Medical History  Diagnosis Date  . Thyroid disease   . Hypothyroidism   . Mental disorder   . Depression   . Anxiety and depression   . Major depression   . PTSD (post-traumatic stress disorder)   . Hepatitis     hep C  . Meniscus tear 02/28/2013    Right side  . Neuromuscular disorder     pinched nerve (on 02/28/13 pt denies having this problem)   Past Surgical History  Procedure Laterality Date  . No past surgeries    . Testiclar cyst excision     History reviewed. No pertinent family history. History  Substance Use Topics  . Smoking status: Current Every Day Smoker -- 0.25 packs/day for 7 years    Types: Cigarettes  . Smokeless tobacco: Not on file  . Alcohol Use: 3.0 oz/week    5 Cans of beer per week     Comment: case beer daily    Review of Systems   Musculoskeletal: Positive for arthralgias.       Right knee pain  Psychiatric/Behavioral: Positive for suicidal ideas.  All other systems reviewed and are negative.    Allergies  Benadryl; Tylenol; and Vistaril  Home Medications   Current Outpatient Rx  Name  Route  Sig  Dispense  Refill  . ARIPiprazole (ABILIFY) 10 MG tablet   Oral   Take 1 tablet (10 mg total) by mouth 2 (two) times daily. For psychosis and mood stabilization and depression.   60 tablet   0   . gabapentin (NEURONTIN) 600 MG tablet   Oral   Take 1 tablet (600 mg total) by mouth 3 (three) times daily. Anxiety and neuropathic pain.   90 tablet   0   . ibuprofen (ADVIL,MOTRIN) 800 MG tablet   Oral   Take 1 tablet (800 mg total) by mouth 3 (three) times daily.   21 tablet   0   . levothyroxine (SYNTHROID, LEVOTHROID) 100 MCG tablet   Oral   Take 1 tablet (100 mcg total) by mouth every morning. For thyroid disease.   30 tablet   0   . naproxen (NAPROSYN) 500 MG tablet   Oral   Take 1 tablet (500 mg total) by mouth 2 (two) times daily.   20 tablet   0   . PARoxetine (PAXIL) 20 MG tablet  Oral   Take 40 mg by mouth daily.          BP 116/70  Pulse 85  Temp(Src) 97.8 F (36.6 C) (Oral)  Resp 18  SpO2 96% Physical Exam  Nursing note and vitals reviewed. Constitutional: He is oriented to person, place, and time. He appears well-developed and well-nourished.  HENT:  Head: Normocephalic and atraumatic.  Eyes: Conjunctivae and EOM are normal. No scleral icterus.  Neck: Normal range of motion.  Cardiovascular: Normal rate, regular rhythm and normal heart sounds.   Pulmonary/Chest: Effort normal and breath sounds normal. No respiratory distress. He has no wheezes. He has no rales. He exhibits no tenderness.  Abdominal: Soft. Bowel sounds are normal. He exhibits no distension and no mass. There is no tenderness. There is no rebound and no guarding.  Musculoskeletal: Normal range of motion.   Neurological: He is alert and oriented to person, place, and time.  Skin: Skin is warm and dry.  Psychiatric: His speech is normal. He is not actively hallucinating. He exhibits a depressed mood. He expresses suicidal ideation. He expresses no homicidal ideation. He expresses no suicidal plans and no homicidal plans.    ED Course  Procedures (including critical care time) Labs Review Labs Reviewed  CBC - Abnormal; Notable for the following:    RBC 4.14 (*)    Hemoglobin 12.6 (*)    HCT 36.2 (*)    All other components within normal limits  COMPREHENSIVE METABOLIC PANEL - Abnormal; Notable for the following:    Sodium 132 (*)    AST 196 (*)    ALT 256 (*)    All other components within normal limits  ETHANOL - Abnormal; Notable for the following:    Alcohol, Ethyl (B) 93 (*)    All other components within normal limits  SALICYLATE LEVEL - Abnormal; Notable for the following:    Salicylate Lvl <2.0 (*)    All other components within normal limits  URINE RAPID DRUG SCREEN (HOSP PERFORMED) - Abnormal; Notable for the following:    Opiates POSITIVE (*)    Cocaine POSITIVE (*)    Tetrahydrocannabinol POSITIVE (*)    All other components within normal limits  ACETAMINOPHEN LEVEL   Imaging Review No results found.  EKG Interpretation   None       MDM   1. Major depression   2. Alcohol dependence   3. Cocaine abuse   4. PTSD (post-traumatic stress disorder)   5. S/P alcohol detoxification   6. Substance induced mood disorder    Pt with hx of depression and polysubstance abuse presenting with SI w/o a plan. Pt is medically cleared to be evaluated by TTS.  Psych hold orders placed. Dispo to be determined by TTS/BH    Junius Finner, PA-C 03/27/13 1849

## 2013-03-27 NOTE — Tx Team (Signed)
Initial Interdisciplinary Treatment Plan  PATIENT STRENGTHS: (choose at least two) General fund of knowledge  PATIENT STRESSORS: Substance abuse   PROBLEM LIST: Problem List/Patient Goals Date to be addressed Date deferred Reason deferred Estimated date of resolution  Depression 03/27/13     Risk for suicide 03/27/13     SA 03/27/13                                          DISCHARGE CRITERIA:  Improved stabilization in mood, thinking, and/or behavior Motivation to continue treatment in a less acute level of care  PRELIMINARY DISCHARGE PLAN: Attend aftercare/continuing care group Attend PHP/IOP  PATIENT/FAMIILY INVOLVEMENT: This treatment plan has been presented to and reviewed with the patient, Whitfield Dulay.  The patient and family have been given the opportunity to ask questions and make suggestions.  Jacques Navy A 03/27/2013, 8:58 PM

## 2013-03-28 ENCOUNTER — Encounter (HOSPITAL_COMMUNITY): Payer: Self-pay | Admitting: Psychiatry

## 2013-03-28 DIAGNOSIS — F1120 Opioid dependence, uncomplicated: Secondary | ICD-10-CM | POA: Diagnosis present

## 2013-03-28 MED ORDER — PAROXETINE HCL 20 MG PO TABS
40.0000 mg | ORAL_TABLET | Freq: Every day | ORAL | Status: DC
  Administered 2013-03-28 – 2013-04-02 (×6): 40 mg via ORAL
  Filled 2013-03-28 (×7): qty 2

## 2013-03-28 MED ORDER — NICOTINE 21 MG/24HR TD PT24
21.0000 mg | MEDICATED_PATCH | Freq: Every day | TRANSDERMAL | Status: DC
  Administered 2013-03-28: 21 mg via TRANSDERMAL
  Filled 2013-03-28 (×4): qty 1

## 2013-03-28 MED ORDER — ARIPIPRAZOLE 10 MG PO TABS
10.0000 mg | ORAL_TABLET | Freq: Two times a day (BID) | ORAL | Status: DC
  Administered 2013-03-28 – 2013-04-02 (×10): 10 mg via ORAL
  Filled 2013-03-28 (×12): qty 1

## 2013-03-28 MED ORDER — GABAPENTIN 300 MG PO CAPS
600.0000 mg | ORAL_CAPSULE | Freq: Three times a day (TID) | ORAL | Status: DC
  Administered 2013-03-28 – 2013-04-02 (×16): 600 mg via ORAL
  Filled 2013-03-28 (×18): qty 2

## 2013-03-28 NOTE — BHH Group Notes (Signed)
Adult Psychoeducational Group Note  Date:  03/28/2013 Time:  10:09 PM  Group Topic/Focus:  AA Meeting  Participation Level:  Minimal  Participation Quality:  Appropriate  Affect:  Appropriate  Cognitive:  Appropriate  Insight: Appropriate  Engagement in Group:  Limited  Modes of Intervention:  Discussion and Education  Additional Comments:  Lauri attended [employer].  Caroll Rancher A 03/28/2013, 10:09 PM

## 2013-03-28 NOTE — BHH Group Notes (Signed)
[facility] LCSW Aftercare Discharge Planning Group Note   03/28/2013 8:45 AM  Participation Quality:  Alert and Appropriate   Mood/Affect:  Flat and Depressed  Depression Rating:  10  Anxiety Rating:  10  Thoughts of Suicide:  Pt endorses SI, denies HI  Will you contract for safety?   Yes  Current AVH:  Pt denies  Plan for Discharge/Comments:  Pt attended discharge planning group and actively participated in group.  CSW provided pt with today's workbook.  Pt states that he came to the hospital for depression, detox and SI.  Pt states that he planned to go to [place] upon last discharge but spent his money on heroin instead.  Pt states that he still plans to move to [place] eventually but wants further inpatient treatment after d/c from here.  CSW will assess for appropriate referrals.  No further needs voiced by pt at this time.    Transportation Means: Pt reports access to transportation  Supports: No supports mentioned at this time  Reyes Ivan, LCSW 03/28/2013 10:30 AM

## 2013-03-28 NOTE — ED Provider Notes (Signed)
Medical screening examination/treatment/procedure(s) were performed by non-physician practitioner and as supervising physician I was immediately available for consultation/collaboration.  EKG Interpretation   None         Jordanne Elsbury M Lucus Lambertson, MD 03/28/13 1509 

## 2013-03-28 NOTE — BHH Suicide Risk Assessment (Signed)
Suicide Risk Assessment  Admission Assessment     Nursing information obtained from:    Demographic factors:    Current Mental Status:    Loss Factors:    Historical Factors:    Risk Reduction Factors:     CLINICAL FACTORS:   Depression:   Comorbid alcohol abuse/dependence Alcohol/Substance Abuse/Dependencies  COGNITIVE FEATURES THAT CONTRIBUTE TO RISK:  Closed-mindedness Polarized thinking Thought constriction (tunnel vision)    SUICIDE RISK:   Moderate:  Frequent suicidal ideation with limited intensity, and duration, some specificity in terms of plans, no associated intent, good self-control, limited dysphoria/symptomatology, some risk factors present, and identifiable protective factors, including available and accessible social support.  PLAN OF CARE: Supportive approach/coping skills/relapse prevention                               Address detox needs                               Reassess and address the co morbidities                               Optimize treatment with psychotropics  I certify that inpatient services furnished can reasonably be expected to improve the patient's condition.  Dandre Sisler A 03/28/2013, 2:32 PM

## 2013-03-28 NOTE — BHH Counselor (Signed)
Adult Psychosocial Assessment Update Interdisciplinary Team  Previous [facility] admissions/discharges:  Admissions Discharges  Date: 03/01/13 Date: 03/07/13  Date: 08/23/12 Date: 08/29/12  Date: 06/25/12 Date:07/08/12  Date: 03/24/12 Date: 04/09/12  Date: 11/27/11 Date: 12/01/11   Changes since the last Psychosocial Assessment (including adherence to outpatient mental health and/or substance abuse treatment, situational issues contributing to decompensation and/or relapse). Pt states that since last admission he was planning to move to [place] but used all of his money on heroin.  Pt states that he still wants to move to [place] once he saves his money back up.  Pt states that he is interested in going to residential treatment.  Pt states that he tried to go to [facility] last time but was turned away due to his Medicaid being in [facility].  CSW will assess for appropriate referrals.               Discharge Plan 1. Will you be returning to the same living situation after discharge?   Yes: X Pt can return home but wants to go for further treatment. No:      If no, what is your plan?           2. Would you like a referral for services when you are discharged? Yes:  X   If yes, for what services? Pt wants long term treatment, CSW will assess for appropriate referrals.    No:              Summary and Recommendations (to be completed by the evaluator) Patient is a 52 year old Caucasian Male with a diagnosis of Major Depression, Recurrent severe, Anxiety Disorder Nos , and Polysubstance Abuse.  Patient lives in [place] alone.  Patient will benefit from crisis stabilization, medication evaluation, group therapy and psycho education in addition to case management for discharge planning.                         Signature:  Carmina Miller, 03/28/2013 9:40 AM

## 2013-03-28 NOTE — Progress Notes (Signed)
Nutrition Brief Note  Patient identified on the Malnutrition Screening Tool (MST) Report.  Wt Readings from Last 4 Encounters:  03/27/13 184 lb (83.462 kg)  03/24/13 185 lb 4.8 oz (84.052 kg)  03/09/13 180 lb (81.647 kg)  03/01/13 180 lb (81.647 kg)    Body mass index is 26.4 kg/(m^2). Patient meets criteria for overweight based on current BMI.   Discussed intake PTA with patient and compared to intake presently.  Discussed changes in intake, if any, and encouraged adequate intake of meals and snacks. Current diet order is regular and pt is also offered choice of unit snacks mid-morning and mid-afternoon.  Pt is eating as desired.   Labs and medications reviewed. Pt with elevated AST/ALT. Getting thiamine and multivitamin daily.   Admitted with heroin, cocaine, and alcohol abuse. Met with pt who reports not eating anything for 2 days PTA. Before then eating regular diet with good appetite and stable weight. Eating well currently. Denies any nutritional concerns or education needs.   No additional nutrition interventions warranted at this time. If nutrition issues arise, please consult RD.   Levon Hedger MS, RD, LDN [phone] Pager [phone] After Hours Pager

## 2013-03-28 NOTE — BHH Group Notes (Signed)
[facility] LCSW Group Therapy  03/28/2013 1:15 PM   Type of Therapy:  Group Therapy  Participation Level:  Did Not Attend - pt was asleep in their room  Jeffory Snelgrove Horton, LCSW 03/28/2013 2:42 PM   

## 2013-03-28 NOTE — Progress Notes (Signed)
Adult Psychoeducational Group Note  Date:  03/28/2013 Time:  2:39 PM  Group Topic/Focus:  Early Warning Signs:   The focus of this group is to help patients identify signs or symptoms they exhibit before slipping into an unhealthy state or crisis.  Participation Level:  None  Additional Comments:  Pt came and sat in on the last five minutes of group.   Cathlean Cower 03/28/2013, 2:39 PM

## 2013-03-28 NOTE — H&P (Signed)
Psychiatric Admission Assessment Adult  Patient Identification:  Edward [name] Date of Evaluation:  03/28/2013 Chief Complaint:  POLYSUBSTANCE DEPENDENCE History of Present Illness:: 52 y.o. male who presents to the Emergency Department for Medical Clearance. Pt states he was partying last night and he "overdid it". Last night he smoked heroin, crack cocaine and drank alcohol. Pt states having SI and no suicidal plans. He does not have access to weapons or guns. He takes Gabapentin and Abilify medication while at home. He is a resident at the [facility]. Pt denies drinking alcohol but last night he "slipped up"---drank alcohol, used heroine and cocaine. He is experiencing pain in his right knee. Pt has a hx of depression and PTSD.  Elements:  Location:  generalized. Quality:  acute. Severity:  severe. Timing:  constant. Duration:  past week. Context:  stressors. Associated Signs/Synptoms: Depression Symptoms:  depressed mood, fatigue, anxiety, (Hypo) Manic Symptoms:  None Anxiety Symptoms:  Excessive Worry, Psychotic Symptoms:  None PTSD Symptoms: Had a traumatic exposure:  combat  Psychiatric Specialty Exam: Physical Exam  Constitutional: He is oriented to person, place, and time. He appears well-developed and well-nourished.  HENT:  Head: Normocephalic and atraumatic.  Neck: Normal range of motion.  Respiratory: Effort normal.  GI: Soft.  Musculoskeletal: Normal range of motion.  Neurological: He is alert and oriented to person, place, and time.  Skin: Skin is warm and dry.    Review of Systems  Constitutional: Negative.   HENT: Negative.   Eyes: Negative.   Respiratory: Negative.   Cardiovascular: Negative.   Gastrointestinal: Negative.   Genitourinary: Negative.   Musculoskeletal:       Right knee issues  Skin: Negative.   Neurological: Negative.   Endo/Heme/Allergies: Negative.   Psychiatric/Behavioral: Positive for depression and substance abuse. The patient is  nervous/anxious.     Blood pressure 103/64, pulse 73, temperature 98.3 F (36.8 C), temperature source Oral, resp. rate 18, height 5\' 10"  (1.778 m), weight 83.462 kg (184 lb).Body mass index is 26.4 kg/(m^2).  General Appearance: Casual  Eye Contact::  Fair  Speech:  Normal Rate  Volume:  Decreased  Mood:  Anxious and Depressed  Affect:  Congruent  Thought Process:  Coherent  Orientation:  Full (Time, Place, and Person)  Thought Content:  WDL  Suicidal Thoughts:  No  Homicidal Thoughts:  No  Memory:  Immediate;   Fair Recent;   Fair Remote;   Fair  Judgement:  Poor  Insight:  Fair  Psychomotor Activity:  Normal  Concentration:  Fair  Recall:  Fair  Akathisia:  No  Handed:  Right  AIMS (if indicated):     Assets:  Leisure Time Physical Health Resilience  Sleep:  Number of Hours: 6.25    Past Psychiatric History: Diagnosis:  Depression, PTSD, anxiety, polysubstance and alcohol dependency  Hospitalizations:  [facility], [facility]  Outpatient Care:  [facility]  Substance Abuse Care:  [facility], [facility]  Self-Mutilation:  None  Suicidal Attempts:  @ overdose attempts  Violent Behaviors:  None   Past Medical History:   Past Medical History  Diagnosis Date  . Thyroid disease   . Hypothyroidism   . Mental disorder   . Depression   . Anxiety and depression   . Major depression   . PTSD (post-traumatic stress disorder)   . Hepatitis     hep C  . Meniscus tear 02/28/2013    Right side  . Neuromuscular disorder     pinched nerve (on 02/28/13 pt denies  having this problem)   None. Allergies:   Allergies  Allergen Reactions  . Benadryl [Diphenhydramine]     Restless leg syndrome  . Tylenol [Acetaminophen]     "I don't take tylenol because I have hepatitis c."  . Vistaril [Hydroxyzine Hcl]     Restless leg syndrome   PTA Medications: Prescriptions prior to admission  Medication Sig Dispense Refill  . ARIPiprazole (ABILIFY) 10 MG tablet Take 1 tablet (10 mg total) by  mouth 2 (two) times daily. For psychosis and mood stabilization and depression.  60 tablet  0  . gabapentin (NEURONTIN) 600 MG tablet Take 1 tablet (600 mg total) by mouth 3 (three) times daily. Anxiety and neuropathic pain.  90 tablet  0  . ibuprofen (ADVIL,MOTRIN) 800 MG tablet Take 1 tablet (800 mg total) by mouth 3 (three) times daily.  21 tablet  0  . levothyroxine (SYNTHROID, LEVOTHROID) 100 MCG tablet Take 1 tablet (100 mcg total) by mouth every morning. For thyroid disease.  30 tablet  0  . naproxen (NAPROSYN) 500 MG tablet Take 1 tablet (500 mg total) by mouth 2 (two) times daily.  20 tablet  0  . PARoxetine (PAXIL) 20 MG tablet Take 40 mg by mouth daily.        Previous Psychotropic Medications:  Medication/Dose    See above     Substance Abuse History in the last 12 months:  yes  Consequences of Substance Abuse: Family Consequences:  Estranged family  Social History:  reports that he has been smoking Cigarettes.  He has a 1.75 pack-year smoking history. He does not have any smokeless tobacco history on file. He reports that he drinks about 3.0 ounces of alcohol per week. He reports that he uses illicit drugs ("Crack" cocaine, Marijuana, and Cocaine). Additional Social History:   Current Place of Residence:   Place of Birth:   Family Members: Marital Status:  Divorced Children:  Sons:  Daughters: Relationships: Education:  GED Educational Problems/Performance: Religious Beliefs/Practices: History of Abuse (Emotional/Phsycial/Sexual) Teacher, music History:  Data processing manager History: Hobbies/Interests:  Family History:  History reviewed. No pertinent family history.  Results for orders placed during the hospital encounter of 03/27/13 (from the past 72 hour(s))  ACETAMINOPHEN LEVEL     Status: None   Collection Time    03/27/13 12:45 PM      Result Value Range   Acetaminophen (Tylenol), Serum <15.0  10 - 30 ug/mL   Comment:            THERAPEUTIC  CONCENTRATIONS VARY     SIGNIFICANTLY. A RANGE OF 10-30     ug/mL MAY BE AN EFFECTIVE     CONCENTRATION FOR MANY PATIENTS.     HOWEVER, SOME ARE BEST TREATED     AT CONCENTRATIONS OUTSIDE THIS     RANGE.     ACETAMINOPHEN CONCENTRATIONS     >150 ug/mL AT 4 HOURS AFTER     INGESTION AND >50 ug/mL AT 12     HOURS AFTER INGESTION ARE     OFTEN ASSOCIATED WITH TOXIC     REACTIONS.  CBC     Status: Abnormal   Collection Time    03/27/13 12:45 PM      Result Value Range   WBC 5.9  4.0 - 10.5 K/uL   RBC 4.14 (*) 4.22 - 5.81 MIL/uL   Hemoglobin 12.6 (*) 13.0 - 17.0 g/dL   HCT 16.1 (*) 09.6 - 04.5 %   MCV 87.4  78.0 -  100.0 fL   MCH 30.4  26.0 - 34.0 pg   MCHC 34.8  30.0 - 36.0 g/dL   RDW 40.9  81.1 - 91.4 %   Platelets 150  150 - 400 K/uL  COMPREHENSIVE METABOLIC PANEL     Status: Abnormal   Collection Time    03/27/13 12:45 PM      Result Value Range   Sodium 132 (*) 135 - 145 mEq/L   Potassium 3.5  3.5 - 5.1 mEq/L   Chloride 96  96 - 112 mEq/L   CO2 25  19 - 32 mEq/L   Glucose, Bld 87  70 - 99 mg/dL   BUN 17  6 - 23 mg/dL   Creatinine, Ser 7.82  0.50 - 1.35 mg/dL   Calcium 8.8  8.4 - 95.6 mg/dL   Total Protein 7.7  6.0 - 8.3 g/dL   Albumin 4.1  3.5 - 5.2 g/dL   AST 213 (*) 0 - 37 U/L   ALT 256 (*) 0 - 53 U/L   Alkaline Phosphatase 85  39 - 117 U/L   Total Bilirubin 0.6  0.3 - 1.2 mg/dL   GFR calc non Af Amer >90  >90 mL/min   GFR calc Af Amer >90  >90 mL/min   Comment: (NOTE)     The eGFR has been calculated using the CKD EPI equation.     This calculation has not been validated in all clinical situations.     eGFR's persistently <90 mL/min signify possible Chronic Kidney     Disease.  ETHANOL     Status: Abnormal   Collection Time    03/27/13 12:45 PM      Result Value Range   Alcohol, Ethyl (B) 93 (*) 0 - 11 mg/dL   Comment:            LOWEST DETECTABLE LIMIT FOR     SERUM ALCOHOL IS 11 mg/dL     FOR MEDICAL PURPOSES ONLY  SALICYLATE LEVEL     Status: Abnormal    Collection Time    03/27/13 12:45 PM      Result Value Range   Salicylate Lvl <2.0 (*) 2.8 - 20.0 mg/dL  URINE RAPID DRUG SCREEN (HOSP PERFORMED)     Status: Abnormal   Collection Time    03/27/13 12:50 PM      Result Value Range   Opiates POSITIVE (*) NONE DETECTED   Cocaine POSITIVE (*) NONE DETECTED   Benzodiazepines NONE DETECTED  NONE DETECTED   Amphetamines NONE DETECTED  NONE DETECTED   Tetrahydrocannabinol POSITIVE (*) NONE DETECTED   Barbiturates NONE DETECTED  NONE DETECTED   Comment:            DRUG SCREEN FOR MEDICAL PURPOSES     ONLY.  IF CONFIRMATION IS NEEDED     FOR ANY PURPOSE, NOTIFY LAB     WITHIN 5 DAYS.                LOWEST DETECTABLE LIMITS     FOR URINE DRUG SCREEN     Drug Class       Cutoff (ng/mL)     Amphetamine      1000     Barbiturate      200     Benzodiazepine   200     Tricyclics       300     Opiates          300     Cocaine  300     THC              50   Psychological Evaluations:  Assessment:   DSM5:  Trauma-Stressor Disorders:  Posttraumatic Stress Disorder (309.81) Substance/Addictive Disorders:  Alcohol Related Disorder - Severe (303.90), Alcohol Intoxication with Use Disorder - Severe (F10.229), Alcohol Withdrawal (291.81) and Opioid Disorder - Severe (304.00) Depressive Disorders:  Major Depressive Disorder - Severe (296.23)  AXIS I:  Alcohol Abuse, Anxiety Disorder NOS, Major Depression, Recurrent severe, Post Traumatic Stress Disorder, Substance Abuse and Substance Induced Mood Disorder AXIS II:  Deferred AXIS III:   Past Medical History  Diagnosis Date  . Thyroid disease   . Hypothyroidism   . Mental disorder   . Depression   . Anxiety and depression   . Major depression   . PTSD (post-traumatic stress disorder)   . Hepatitis     hep C  . Meniscus tear 02/28/2013    Right side  . Neuromuscular disorder     pinched nerve (on 02/28/13 pt denies having this problem)   AXIS IV:  economic problems, housing  problems, other psychosocial or environmental problems, problems related to social environment and problems with primary support group AXIS V:  41-50 serious symptoms  Treatment Plan/Recommendations:  Plan:  Review of chart, vital signs, medications, and notes. 1-Admit for crisis management and stabilization.  Estimated length of stay 5-7 days past his current stay of 1 2-Individual and group therapy encouraged 3-Medication management for depression, alcohol withdrawal/detox and anxiety to reduce current symptoms to base line and improve the patient's overall level of functioning:  Medications reviewed with the patient and he stated no untoward effects, home medications in place and Librium protocol started 4-Coping skills for depression, substance abuse, and anxiety developing-- 5-Continue crisis stabilization and management 6-Address health issues--monitoring vital signs, stable  7-Treatment plan in progress to prevent relapse of depression, substance abuse, and anxiety 8-Psychosocial education regarding relapse prevention and self-care 8-Health care follow up as needed for any health concerns  9-Call for consult with hospitalist for additional specialty patient services as needed.  Treatment Plan Summary: Daily contact with patient to assess and evaluate symptoms and progress in treatment Medication management Supportive approach/coping skills/relapse prevention Detox as needed, reassess and address the co morbidities Current Medications:  Current Facility-Administered Medications  Medication Dose Route Frequency Provider Last Rate Last Dose  . alum & mag hydroxide-simeth (MAALOX/MYLANTA) 200-200-20 MG/5ML suspension 30 mL  30 mL Oral Q4H PRN Kristeen Mans, NP      . ARIPiprazole (ABILIFY) tablet 10 mg  10 mg Oral BID AC [name], NP      . chlordiazePOXIDE (LIBRIUM) capsule 25 mg  25 mg Oral Q6H PRN Kristeen Mans, NP      . chlordiazePOXIDE (LIBRIUM) capsule 25 mg  25 mg Oral QID  Kristeen Mans, NP   25 mg at 03/28/13 1140   Followed by  . [START ON 03/29/2013] chlordiazePOXIDE (LIBRIUM) capsule 25 mg  25 mg Oral TID Kristeen Mans, NP       Followed by  . [START ON 03/30/2013] chlordiazePOXIDE (LIBRIUM) capsule 25 mg  25 mg Oral BH-qamhs Kristeen Mans, NP       Followed by  . [START ON 04/01/2013] chlordiazePOXIDE (LIBRIUM) capsule 25 mg  25 mg Oral Daily Kristeen Mans, NP      . chlordiazePOXIDE (LIBRIUM) capsule 50 mg  50 mg Oral Once Kristeen Mans, NP      .  cloNIDine (CATAPRES) tablet 0.1 mg  0.1 mg Oral QID Kristeen Mans, NP   0.1 mg at 03/28/13 1141   Followed by  . [START ON 03/30/2013] cloNIDine (CATAPRES) tablet 0.1 mg  0.1 mg Oral BH-qamhs Kristeen Mans, NP       Followed by  . [START ON 04/02/2013] cloNIDine (CATAPRES) tablet 0.1 mg  0.1 mg Oral QAC breakfast Kristeen Mans, NP      . dicyclomine (BENTYL) tablet 20 mg  20 mg Oral Q6H PRN Kristeen Mans, NP      . gabapentin (NEURONTIN) capsule 600 mg  600 mg Oral TID [name], NP      . levothyroxine (SYNTHROID, LEVOTHROID) tablet 100 mcg  100 mcg Oral Daily Kristeen Mans, NP   100 mcg at 03/28/13 4696  . loperamide (IMODIUM) capsule 2-4 mg  2-4 mg Oral PRN Kristeen Mans, NP      . loperamide (IMODIUM) capsule 2-4 mg  2-4 mg Oral PRN Kristeen Mans, NP      . magnesium hydroxide (MILK OF MAGNESIA) suspension 30 mL  30 mL Oral Daily PRN Kristeen Mans, NP      . methocarbamol (ROBAXIN) tablet 500 mg  500 mg Oral Q8H PRN Kristeen Mans, NP   500 mg at 03/28/13 2952  . multivitamin with minerals tablet 1 tablet  1 tablet Oral Daily Kristeen Mans, NP   1 tablet at 03/28/13 0820  . naproxen (NAPROSYN) tablet 500 mg  500 mg Oral BID PRN Kristeen Mans, NP   500 mg at 03/28/13 0205  . nicotine (NICODERM CQ - dosed in mg/24 hours) patch 21 mg  21 mg Transdermal Daily Rachael Fee, MD   21 mg at 03/28/13 1141  . ondansetron (ZOFRAN-ODT) disintegrating tablet 4 mg  4 mg Oral Q6H PRN Kristeen Mans, NP      . ondansetron  (ZOFRAN-ODT) disintegrating tablet 4 mg  4 mg Oral Q6H PRN Kristeen Mans, NP      . PARoxetine (PAXIL) tablet 40 mg  40 mg Oral Daily [name], NP      . thiamine (B-1) injection 100 mg  100 mg Intramuscular Once Kristeen Mans, NP      . thiamine (VITAMIN B-1) tablet 100 mg  100 mg Oral Daily Kristeen Mans, NP   100 mg at 03/28/13 0820    Observation Level/Precautions:  15 minute checks  Laboratory: Completed, reviewed, stable  Psychotherapy:  Individual and group therapy  Medications:  Paxil, gabapentin, Abilify, synthroid  Consultations:  NOne  Discharge Concerns:  None  Estimated LOS:  5-7 days  Other:     I certify that inpatient services furnished can reasonably be expected to improve the patient's condition.   [name], PMH-NP 12/5/201411:55 AM Personally evaluated the patient, reviewed the physical exam and agree with assessment and plan Madie Reno A. Dub Mikes, M.D.

## 2013-03-28 NOTE — Tx Team (Signed)
Interdisciplinary Treatment Plan Update (Adult)  Date: 03/28/2013  Time Reviewed:  9:45 AM  Progress in Treatment: Attending groups: Yes Participating in groups:  Yes Taking medication as prescribed:  Yes Tolerating medication:  Yes Family/Significant othe contact made: CSW assessing Patient understands diagnosis:  Yes Discussing patient identified problems/goals with staff:  Yes Medical problems stabilized or resolved:  Yes Denies suicidal/homicidal ideation: Yes Issues/concerns per patient self-inventory:  Yes Other:  New problem(s) identified: N/A  Discharge Plan or Barriers: CSW assessing for appropriate referrals.    Reason for Continuation of Hospitalization: Anxiety Depression Medication Stabilization Detox  Comments: N/A  Estimated length of stay: 3-5 days  For review of initial/current patient goals, please see plan of care.  Attendees: Patient:     Family:     Physician:  Dr. Lugo 03/28/2013 10:06 AM   Nursing:   Linsey Squires, RN 03/28/2013 10:06 AM   Clinical Social Worker:  Kayson Bullis Horton, LCSW 03/28/2013 10:06 AM   Other: Jennifer Clark, RN case manager 03/28/2013 10:06 AM   Other:    Other:    Other:     Other:    Other:    Other:    Other:    Other:    Other:     Scribe for Treatment Team:   Horton, Annalie Wenner Nicole, 03/28/2013 , 10:06 AM  

## 2013-03-28 NOTE — [facility] Suicide Risk Assessment (Signed)
[facility] Family/Significant Other Suicide Prevention Education  Suicide Prevention Education:   Patient Refusal for Family/Significant Other Suicide Prevention Education: The patient has refused to provide written consent for family/significant other to be provided Family/Significant Other Suicide Prevention Education during admission and/or prior to discharge.  Physician notified.  CSW provided suicide prevention information with patient.    The suicide prevention education provided includes the following:  Suicide risk factors  Suicide prevention and interventions  National Suicide Hotline telephone number  [facility] assessment telephone number  [facility] Emergency Assistance 911  [facility] and/or Residential Mobile Crisis Unit telephone number   Reyes Ivan, [place] 03/28/2013 9:54 AM

## 2013-03-28 NOTE — Progress Notes (Signed)
Admission Note:  D:52 yr male who presents [facility] in no acute distress for the treatment of SI and Depression. Pt stated he's been feeling increased AI  Pt appears flat and depressed. Pt was calm and cooperative with admission process. Pt presents with passive SI and contracts for safety upon admission. Pt denies AVH . Pt stated "I just partied too much, I did a lot of heroin yesterday I did some damage."   A:Skin was assessed and found to be clear of any abnormal marks apart from tattoos on back, chest, abrasions on R-arm. Marland Kitchen POC and unit policies explained and understanding verbalized. Consents obtained. Food and fluids offered, and  Accepted.  R: Pt had no additional questions or concerns.

## 2013-03-29 DIAGNOSIS — F1410 Cocaine abuse, uncomplicated: Secondary | ICD-10-CM

## 2013-03-29 DIAGNOSIS — F329 Major depressive disorder, single episode, unspecified: Secondary | ICD-10-CM

## 2013-03-29 DIAGNOSIS — F1020 Alcohol dependence, uncomplicated: Principal | ICD-10-CM

## 2013-03-29 DIAGNOSIS — F1010 Alcohol abuse, uncomplicated: Secondary | ICD-10-CM

## 2013-03-29 DIAGNOSIS — F4310 Post-traumatic stress disorder, unspecified: Secondary | ICD-10-CM

## 2013-03-29 DIAGNOSIS — F1120 Opioid dependence, uncomplicated: Secondary | ICD-10-CM

## 2013-03-29 MED ORDER — TRAZODONE HCL 50 MG PO TABS
50.0000 mg | ORAL_TABLET | Freq: Every day | ORAL | Status: DC
  Administered 2013-03-29 – 2013-03-30 (×2): 50 mg via ORAL
  Filled 2013-03-29 (×5): qty 1

## 2013-03-29 MED ORDER — NICOTINE POLACRILEX 2 MG MT GUM
2.0000 mg | CHEWING_GUM | OROMUCOSAL | Status: DC | PRN
  Administered 2013-03-29 – 2013-03-30 (×3): 2 mg via ORAL
  Filled 2013-03-29 (×2): qty 1

## 2013-03-29 NOTE — [facility] Group Notes (Addendum)
Adult Psychoeducational Group Note  Date:  03/29/2013 Time:  0930am  Group Topic/Focus:  Self Inventory and Healthy Coping Skills Review  Participation Level:  Minimal  Participation Quality:  Appropriate and Attentive  Affect:  Flat  Cognitive:  Alert and Appropriate  Insight: Appropriate and Improving  Engagement in Group:  Improving  Modes of Intervention:  Discussion  Additional Comments:  Pt sat quietly and attentively while other patients shared, pt left group because MD wanted to assess pt in his office and pt returned towards the end of group, pt did share that he was feeling better and that it was as though the "cloud is lifting", pt seemed very receptive and insight is improving, goal for today is to listen and take in all he can while he is here.  Alfonse Spruce 03/29/2013, 1:29 PM

## 2013-03-29 NOTE — Progress Notes (Signed)
D:  Pt continues to be passive SI, but contracts for safety.  Pt denies HI/AVH. Pt is pleasant and cooperative. Pt stated he slept too much . "I feel like I'm doing better. Pt wanted to get the results of his HIV.    A: Pt was offered support and encouragement. Pt was given scheduled medications. Pt was encourage to attend groups. Q 15 minute checks were done for safety.   R:Pt attends groups and interacts well with peers and staff. Pt is taking medication.Pt receptive to treatment and safety maintained on unit.

## 2013-03-29 NOTE — [facility] Group Notes (Signed)
[facility] Group Notes:  (Clinical Social Work)  03/29/2013     10-11AM  Summary of Progress/Problems:   The main focus of today's process group was for the patient to identify ways in which they have in the past sabotaged their own recovery. Motivational Interviewing was utilized to ask the group members what they get out of their substance use, and what reasons they may have for wanting to change.  The Stages of Change were explained using a handout, and patients identified where they currently are with regard to stages of change.  The patient expressed that he is in the hospital because he has been self-sabotaging with heroin, is here to detox.  Shortly thereafter, he left the group and did not return.  Type of Therapy:  Group Therapy - Process   Participation Level:  Minimal  Participation Quality:  Inattentive and Resistant  Affect:  Flat  Cognitive:  Oriented  Insight:  Limited  Engagement in Therapy:  Limited  Modes of Intervention:  Education, Support and Processing, Motivational Interviewing  Ambrose Mantle, LCSW 03/29/2013, 12:18 PM

## 2013-03-29 NOTE — Progress Notes (Signed)
D.  Pt pleasant on approach, but anxious.  Pt did not attend evening wrap up group, did not feel well enough.  Interacting appropriately within milieu.  Denies SI/HI/hallucinations at this time.   Pt has very bad GERD, required Maalox after he was heard choking in his room.  A.  Support and encouragement offered  R.  Pt remains safe on unit, will continue to monitor.

## 2013-03-29 NOTE — [facility] Group Notes (Signed)
Adult Psychoeducational Group Note  Date:  03/29/2013 Time:  1315  Group Topic/Focus:  Making Healthy Choices:   The focus of this group is to help patients identify negative/unhealthy choices they were using prior to admission and identify positive/healthier coping strategies to replace them upon discharge.  Participation Level:  Did Not Attend  Alfonse Spruce 03/29/2013, 3:41 PM

## 2013-03-29 NOTE — Progress Notes (Signed)
[facility] MD Progress Note  03/29/2013 3:23 PM Edward [name]  MRN:  [id number]  Subjective:  Ehtan reports, "I feel crappy today. I feel like shooting some dope right now. I came back here to get some more help. I was told that I may be going to [facility] and or [facility]. I'm sleeping poorly. I still feel suicidal". Denies any intent and or plans to hurt self and or others.  Diagnosis:   DSM5: Schizophrenia Disorders:  NA Obsessive-Compulsive Disorders:  NA Trauma-Stressor Disorders:  Posttraumatic Stress Disorder (309.81) Substance/Addictive Disorders:  Alcohol Related Disorder - Severe (303.90) and Opioid Disorder - Severe (304.00), Cocaine dependence Depressive Disorders:  Major Depressive Disorder - Moderate (296.22)  Axis I: Alcohol Related Disorder - Severe (303.90) and Opioid Disorder - Severe (304.00), MDD Axis II: Deferred Axis III:  Past Medical History  Diagnosis Date  . Thyroid disease   . Hypothyroidism   . Mental disorder   . Depression   . Anxiety and depression   . Major depression   . PTSD (post-traumatic stress disorder)   . Hepatitis     hep C  . Meniscus tear 02/28/2013    Right side  . Neuromuscular disorder     pinched nerve (on 02/28/13 pt denies having this problem)   Axis IV: other psychosocial or environmental problems and Polysubstance abuse/dependence Axis V: 41-50 serious symptoms  ADL's:  Intact  Sleep: "Poor"  Appetite:  Good  Suicidal Ideation:  Plan:  Denies Intent:  Denies Means:  Denies  Homicidal Ideation:  Plan:  Denies Intent:  Denies Means:  Denies  AEB (as evidenced by):  Psychiatric Specialty Exam: Review of Systems  Constitutional: Negative.   HENT: Negative.   Eyes: Negative.   Respiratory: Negative.   Cardiovascular: Negative.   Gastrointestinal: Negative.   Genitourinary: Negative.   Skin: Negative for itching and rash.       Several bodily tattoos present  Neurological: Negative.   Psychiatric/Behavioral:  Positive for depression (Currently on medication for stabilization), hallucinations and substance abuse (Alcohol, Cocaine, opioid dependence). Negative for suicidal ideas and memory loss. The patient is nervous/anxious (Currtently being stabillized with medication). The patient does not have insomnia.     Blood pressure 110/74, pulse 57, temperature 97.4 F (36.3 C), temperature source Oral, resp. rate 18, height 5\' 10"  (1.778 m), weight 83.462 kg (184 lb).Body mass index is 26.4 kg/(m^2).  General Appearance: Casual and Fairly Groomed  Eye Contact::  Good  Speech:  Clear and Coherent  Volume:  Normal  Mood:  Depressed  Affect:  Non-Congruent, although states depressed, Edward [name] presents with good affect  Thought Process:  Coherent and Intact  Orientation:  Full (Time, Place, and Person)  Thought Content:  Rumination and Denies hallucinations  Suicidal Thoughts:  No  Homicidal Thoughts:  No  Memory:  Immediate;   Good Recent;   Good Remote;   Good  Judgement:  Fair  Insight:  Lacking  Psychomotor Activity:  Normal  Concentration:  Good  Recall:  Good  Akathisia:  No  Handed:  Right  AIMS (if indicated):     Assets:  Communication Skills Desire for Improvement  Sleep:  Number of Hours: 5   Current Medications: Current Facility-Administered Medications  Medication Dose Route Frequency Provider Last Rate Last Dose  . alum & mag hydroxide-simeth (MAALOX/MYLANTA) 200-200-20 MG/5ML suspension 30 mL  30 mL Oral Q4H PRN Kristeen Mans, NP      . ARIPiprazole (ABILIFY) tablet 10 mg  10 mg  Oral BID AC [name], NP   10 mg at 03/29/13 1191  . chlordiazePOXIDE (LIBRIUM) capsule 25 mg  25 mg Oral Q6H PRN Kristeen Mans, NP   25 mg at 03/29/13 1035  . chlordiazePOXIDE (LIBRIUM) capsule 25 mg  25 mg Oral TID Kristeen Mans, NP   25 mg at 03/29/13 1136   Followed by  . [START ON 03/30/2013] chlordiazePOXIDE (LIBRIUM) capsule 25 mg  25 mg Oral BH-qamhs Kristeen Mans, NP       Followed by  . [START  ON 04/01/2013] chlordiazePOXIDE (LIBRIUM) capsule 25 mg  25 mg Oral Daily Kristeen Mans, NP      . chlordiazePOXIDE (LIBRIUM) capsule 50 mg  50 mg Oral Once Kristeen Mans, NP      . cloNIDine (CATAPRES) tablet 0.1 mg  0.1 mg Oral QID Kristeen Mans, NP   0.1 mg at 03/29/13 1135   Followed by  . [START ON 03/30/2013] cloNIDine (CATAPRES) tablet 0.1 mg  0.1 mg Oral BH-qamhs Kristeen Mans, NP       Followed by  . [START ON 04/02/2013] cloNIDine (CATAPRES) tablet 0.1 mg  0.1 mg Oral QAC breakfast Kristeen Mans, NP      . dicyclomine (BENTYL) tablet 20 mg  20 mg Oral Q6H PRN Kristeen Mans, NP      . gabapentin (NEURONTIN) capsule 600 mg  600 mg Oral TID [name], NP   600 mg at 03/29/13 1135  . levothyroxine (SYNTHROID, LEVOTHROID) tablet 100 mcg  100 mcg Oral Daily Kristeen Mans, NP   100 mcg at 03/29/13 4782  . loperamide (IMODIUM) capsule 2-4 mg  2-4 mg Oral PRN Kristeen Mans, NP      . loperamide (IMODIUM) capsule 2-4 mg  2-4 mg Oral PRN Kristeen Mans, NP      . magnesium hydroxide (MILK OF MAGNESIA) suspension 30 mL  30 mL Oral Daily PRN Kristeen Mans, NP      . methocarbamol (ROBAXIN) tablet 500 mg  500 mg Oral Q8H PRN Kristeen Mans, NP   500 mg at 03/28/13 9562  . multivitamin with minerals tablet 1 tablet  1 tablet Oral Daily Kristeen Mans, NP   1 tablet at 03/29/13 0756  . naproxen (NAPROSYN) tablet 500 mg  500 mg Oral BID PRN Kristeen Mans, NP   500 mg at 03/29/13 1308  . nicotine polacrilex (NICORETTE) gum 2 mg  2 mg Oral PRN Rachael Fee, MD   2 mg at 03/29/13 [id number]  . ondansetron (ZOFRAN-ODT) disintegrating tablet 4 mg  4 mg Oral Q6H PRN Kristeen Mans, NP      . ondansetron (ZOFRAN-ODT) disintegrating tablet 4 mg  4 mg Oral Q6H PRN Kristeen Mans, NP      . PARoxetine (PAXIL) tablet 40 mg  40 mg Oral Daily [name], NP   40 mg at 03/29/13 0756  . thiamine (B-1) injection 100 mg  100 mg Intramuscular Once Kristeen Mans, NP      . thiamine (VITAMIN B-1) tablet 100 mg  100 mg Oral Daily  Kristeen Mans, NP   100 mg at 03/29/13 4696    Lab Results: No results found for this or any previous visit (from the past 48 hour(s)).  Physical Findings: AIMS: Facial and Oral Movements Muscles of Facial Expression: None, normal Lips and Perioral Area: None, normal Jaw: None, normal Tongue: None, normal,Extremity Movements Upper (arms, wrists,  hands, fingers): None, normal Lower (legs, knees, ankles, toes): None, normal, Trunk Movements Neck, shoulders, hips: None, normal, Overall Severity Severity of abnormal movements (highest score from questions above): None, normal Incapacitation due to abnormal movements: None, normal Patient's awareness of abnormal movements (rate only patient's report): No Awareness, Dental Status Current problems with teeth and/or dentures?: No Does patient usually wear dentures?: No  CIWA:  CIWA-Ar Total: 5 COWS:  COWS Total Score: 3  Treatment Plan Summary: Daily contact with patient to assess and evaluate symptoms and progress in treatment Medication management  Plan: Supportive approach/coping skills/relapse prevention. Initiate Trazodone 50 mg Q bedtime for sleep. Encouraged out of room, participation in group sessions and application of coping skills when distressed. Will continue to monitor response to/adverse effects of medications in use to assure effectiveness. Continue to monitor mood, behavior and interaction with staff and other patients. Continue current plan of care.  Medical Decision Making Problem Points:  Review of last therapy session (1) and Review of psycho-social stressors (1) Data Points:  Review of medication regiment & side effects (2) Review of new medications or change in dosage (2)  I certify that inpatient services furnished can reasonably be expected to improve the patient's condition.   Armandina Stammer I, PMHNP, FNP-BC 03/29/2013, 3:23 PM

## 2013-03-29 NOTE — Progress Notes (Signed)
Patient ID: Edward [name], male   DOB: 07-21-1960, 52 y.o.   MRN: [id number]  D: Pt denies SI/HI/AVH. Pt is pleasant and cooperative. Pt stated he had a good day, pt just saying feeling very anxious and having symptoms of withdrawal.   A: Pt was offered support and encouragement. Pt was given scheduled medications. Pt was encourage to attend groups. Q 15 minute checks were done for safety.   R:Pt attends groups and interacts well with peers and staff. Pt is taking medication.Pt receptive to treatment and safety maintained on unit.

## 2013-03-29 NOTE — Progress Notes (Signed)
[facility] Group Notes:  (Nursing/MHT/Case Management/Adjunct)  Date:  03/29/2013  Time:  6:30 PM  Type of Therapy:  Psychoeducational Skills  Participation Level:  Active  Participation Quality:  Appropriate  Affect:  Appropriate  Cognitive:  Appropriate  Insight:  Appropriate  Engagement in Group:  Engaged  Modes of Intervention:  Activity  Summary of Progress/Problems: Pt stated one coping skill he has learned is teamwork.  Caswell Corwin 03/29/2013, 6:30 PM

## 2013-03-30 MED ORDER — IBUPROFEN 600 MG PO TABS
600.0000 mg | ORAL_TABLET | Freq: Four times a day (QID) | ORAL | Status: DC | PRN
Start: 2013-03-30 — End: 2013-03-31
  Administered 2013-03-30: 06:00:00 via ORAL
  Administered 2013-03-31 (×2): 600 mg via ORAL
  Filled 2013-03-30 (×2): qty 1

## 2013-03-30 MED ORDER — IBUPROFEN 600 MG PO TABS
ORAL_TABLET | ORAL | Status: AC
  Filled 2013-03-30: qty 1

## 2013-03-30 MED ORDER — TRAZODONE HCL 50 MG PO TABS
50.0000 mg | ORAL_TABLET | Freq: Every evening | ORAL | Status: DC | PRN
  Administered 2013-03-30: 50 mg via ORAL

## 2013-03-30 NOTE — [facility] Group Notes (Signed)
[facility] Group Notes:  (Clinical Social Work)  03/30/2013  10:00-11:00AM  Summary of Progress/Problems:   The main focus of today's process group was to identify the patient's current support system and explore what other supports can be put in place.  There was also an extensive discussion about what constitutes a healthy support versus an unhealthy support.  With some group members' miscomprehension about the role of counseling in recovery, a great deal of the time was spent on discussing expectations for self and others in treatment.  The patient was late to group, but made numerous helpful contributions to the discussion.  He was the sole patient in the room who has a current counselor, and he told how this is helpful to him.  He also shared about [facility] and how that helps him.  Type of Therapy:  Process Group with Motivational Interviewing  Participation Level:  Active  Participation Quality:  Attentive, Sharing and Supportive  Affect:  Blunted  Cognitive:  Appropriate and Oriented  Insight:  Engaged  Engagement in Therapy:  Engaged  Modes of Intervention:   Education, Support and Processing, Activity  [employer], LCSW 03/30/2013, 12:28 PM

## 2013-03-30 NOTE — Progress Notes (Signed)
Patient ID: Edward [name], male   DOB: 04-26-1960, 52 y.o.   MRN: [id number] D. Pt pleasant but anxious on approach. Pt denies SI HI Avh at this time. Pt Interacting appropriately in milieu. A. Support and encouragement offered. R. Pt remains safe on unit will continue to monitor.

## 2013-03-30 NOTE — Progress Notes (Signed)
Patient ID: Edward [name], male   DOB: 1961-02-24, 52 y.o.   MRN: [id number]  Pt complains of seeing double and bouncing off the walls and being very anxious. Pt refuses librium because he states it will make him buzz. It was explained he may be having withdrawl symptoms and the librium would help. Pt stuck out his hands and said no I do not have tremors. It was explained there are more withdrawal symptoms than just tremors. He stated he just wants the other medication but not the librium.

## 2013-03-30 NOTE — Progress Notes (Signed)
Adult Psychoeducational Group Note  Date:  03/30/2013 Time:  10:40 AM  Group Topic/Focus:  Making Healthy Choices:   The focus of this group is to help patients identify negative/unhealthy choices they were using prior to admission and identify positive/healthier coping strategies to replace them upon discharge.  Participation Level:  Active  Participation Quality:  Appropriate and Attentive  Affect:  Anxious  Cognitive:  Alert and Appropriate  Insight: Improving  Engagement in Group:  Supportive  Modes of Intervention:  Discussion  Additional Comments:  Patient showed insight into relapse prevention and how to utilize his support systems.  Edward [name] 03/30/2013, 10:40 AM

## 2013-03-30 NOTE — [facility] Group Notes (Signed)
[facility] Group Notes:  (Nursing/MHT/Case Management/Adjunct)  Date:  03/30/2013  Time:  3:03 PM  Type of Therapy:  Psychoeducational Skills  Participation Level:  Did Not Attend  Participation Quality:  na  Affect:  na  Cognitive:  na  Insight:  None  Engagement in Group:  na  Modes of Intervention:  NA  Summary of Progress/Problems:  Edward [name] C 03/30/2013, 3:03 PM

## 2013-03-30 NOTE — Progress Notes (Signed)
D.  Pt pleasant on approach, not feeling well.  Pt refused Clonidine and Librium tonight due to not feeling well, feeling buzzed, and off "kilter".  Pt did take Trazodone for sleep and would like to be able to repeat this as he is used to taking 300 mg per Pt.  Denies SI/HI/hallucinations at this time.  Interacting appropriately within milieu.  A.  Support and encouragement offered, NP notified of Pt request and request under review.  R.  Pt remains safe on unit, will continue to monitor.

## 2013-03-30 NOTE — Progress Notes (Signed)
Patient ID: Edward [name], male   DOB: 03/19/1961, 52 y.o.   MRN: [id number] [facility] MD Progress Note  03/30/2013 2:42 PM Edward [name]  MRN:  [id number]  Subjective:  Edward [name] reports, "I'm just fine, lying here thinking about shooting dope. I can't seem to get it out of my mind. I guess I'm craving it. But I will be fine. I'm trying to sleep it off"  Diagnosis:   DSM5: Schizophrenia Disorders:  NA Obsessive-Compulsive Disorders:  NA Trauma-Stressor Disorders:  Posttraumatic Stress Disorder (309.81) Substance/Addictive Disorders:  Alcohol Related Disorder - Severe (303.90) and Opioid Disorder - Severe (304.00), Cocaine dependence Depressive Disorders:  Major Depressive Disorder - Moderate (296.22)  Axis I: Alcohol Related Disorder - Severe (303.90) and Opioid Disorder - Severe (304.00), MDD Axis II: Deferred Axis III:  Past Medical History  Diagnosis Date  . Thyroid disease   . Hypothyroidism   . Mental disorder   . Depression   . Anxiety and depression   . Major depression   . PTSD (post-traumatic stress disorder)   . Hepatitis     hep C  . Meniscus tear 02/28/2013    Right side  . Neuromuscular disorder     pinched nerve (on 02/28/13 pt denies having this problem)   Axis IV: other psychosocial or environmental problems and Polysubstance abuse/dependence Axis V: 41-50 serious symptoms  ADL's:  Intact  Sleep: "Poor"  Appetite:  Good  Suicidal Ideation:  Plan:  Denies Intent:  Denies Means:  Denies  Homicidal Ideation:  Plan:  Denies Intent:  Denies Means:  Denies  AEB (as evidenced by):  Psychiatric Specialty Exam: Review of Systems  Constitutional: Negative.   HENT: Negative.   Eyes: Negative.   Respiratory: Negative.   Cardiovascular: Negative.   Gastrointestinal: Negative.   Genitourinary: Negative.   Skin: Negative for itching and rash.       Several bodily tattoos present  Neurological: Negative.   Psychiatric/Behavioral: Positive for depression (Currently on  medication for stabilization), hallucinations and substance abuse (Alcohol, Cocaine, opioid dependence). Negative for suicidal ideas and memory loss. The patient is nervous/anxious (Currtently being stabillized with medication). The patient does not have insomnia.     Blood pressure 88/55, pulse 66, temperature 97.7 F (36.5 C), temperature source Oral, resp. rate 22, height 5\' 10"  (1.778 m), weight 83.462 kg (184 lb).Body mass index is 26.4 kg/(m^2).  General Appearance: Casual and Fairly Groomed  Eye Contact::  Good  Speech:  Clear and Coherent  Volume:  Normal  Mood:  Depressed  Affect:  Non-Congruent, although states depressed, he presents with good affect  Thought Process:  Coherent and Intact  Orientation:  Full (Time, Place, and Person)  Thought Content:  Rumination and Denies hallucinations  Suicidal Thoughts:  No  Homicidal Thoughts:  No  Memory:  Immediate;   Good Recent;   Good Remote;   Good  Judgement:  Fair  Insight:  Lacking  Psychomotor Activity:  Normal  Concentration:  Good  Recall:  Good  Akathisia:  No  Handed:  Right  AIMS (if indicated):     Assets:  Communication Skills Desire for Improvement  Sleep:  Number of Hours: 5   Current Medications: Current Facility-Administered Medications  Medication Dose Route Frequency Provider Last Rate Last Dose  . alum & mag hydroxide-simeth (MAALOX/MYLANTA) 200-200-20 MG/5ML suspension 30 mL  30 mL Oral Q4H PRN Kristeen Mans, NP      . ARIPiprazole (ABILIFY) tablet 10 mg  10 mg Oral BID AC Catha Nottingham  Lord, NP   10 mg at 03/30/13 0537  . chlordiazePOXIDE (LIBRIUM) capsule 25 mg  25 mg Oral Q6H PRN Kristeen Mans, NP   25 mg at 03/30/13 1610  . chlordiazePOXIDE (LIBRIUM) capsule 25 mg  25 mg Oral BH-qamhs Kristeen Mans, NP       Followed by  . [START ON 04/01/2013] chlordiazePOXIDE (LIBRIUM) capsule 25 mg  25 mg Oral Daily Kristeen Mans, NP      . chlordiazePOXIDE (LIBRIUM) capsule 50 mg  50 mg Oral Once Kristeen Mans, NP       . cloNIDine (CATAPRES) tablet 0.1 mg  0.1 mg Oral BH-qamhs Kristeen Mans, NP       Followed by  . [START ON 04/02/2013] cloNIDine (CATAPRES) tablet 0.1 mg  0.1 mg Oral QAC breakfast Kristeen Mans, NP      . dicyclomine (BENTYL) tablet 20 mg  20 mg Oral Q6H PRN Kristeen Mans, NP      . gabapentin (NEURONTIN) capsule 600 mg  600 mg Oral TID [name], NP   600 mg at 03/30/13 1159  . ibuprofen (ADVIL,MOTRIN) tablet 600 mg  600 mg Oral Q6H PRN Kristeen Mans, NP      . levothyroxine (SYNTHROID, LEVOTHROID) tablet 100 mcg  100 mcg Oral Daily Kristeen Mans, NP   100 mcg at 03/30/13 0809  . loperamide (IMODIUM) capsule 2-4 mg  2-4 mg Oral PRN Kristeen Mans, NP      . loperamide (IMODIUM) capsule 2-4 mg  2-4 mg Oral PRN Kristeen Mans, NP      . magnesium hydroxide (MILK OF MAGNESIA) suspension 30 mL  30 mL Oral Daily PRN Kristeen Mans, NP      . methocarbamol (ROBAXIN) tablet 500 mg  500 mg Oral Q8H PRN Kristeen Mans, NP   500 mg at 03/28/13 9604  . multivitamin with minerals tablet 1 tablet  1 tablet Oral Daily Kristeen Mans, NP   1 tablet at 03/30/13 0809  . naproxen (NAPROSYN) tablet 500 mg  500 mg Oral BID PRN Kristeen Mans, NP   500 mg at 03/30/13 5409  . nicotine polacrilex (NICORETTE) gum 2 mg  2 mg Oral PRN Rachael Fee, MD   2 mg at 03/29/13 [phone]  . ondansetron (ZOFRAN-ODT) disintegrating tablet 4 mg  4 mg Oral Q6H PRN Kristeen Mans, NP      . ondansetron (ZOFRAN-ODT) disintegrating tablet 4 mg  4 mg Oral Q6H PRN Kristeen Mans, NP      . PARoxetine (PAXIL) tablet 40 mg  40 mg Oral Daily [name], NP   40 mg at 03/30/13 0810  . thiamine (B-1) injection 100 mg  100 mg Intramuscular Once Kristeen Mans, NP      . thiamine (VITAMIN B-1) tablet 100 mg  100 mg Oral Daily Kristeen Mans, NP   100 mg at 03/30/13 0809  . traZODone (DESYREL) tablet 50 mg  50 mg Oral QHS Sanjuana Kava, NP   50 mg at 03/29/13 2122    Lab Results: No results found for this or any previous visit (from the past 48  hour(s)).  Physical Findings: AIMS: Facial and Oral Movements Muscles of Facial Expression: None, normal Lips and Perioral Area: None, normal Jaw: None, normal Tongue: None, normal,Extremity Movements Upper (arms, wrists, hands, fingers): None, normal Lower (legs, knees, ankles, toes): None, normal, Trunk Movements Neck, shoulders, hips: None, normal, Overall Severity  Severity of abnormal movements (highest score from questions above): None, normal Incapacitation due to abnormal movements: None, normal Patient's awareness of abnormal movements (rate only patient's report): No Awareness, Dental Status Current problems with teeth and/or dentures?: No Does patient usually wear dentures?: No  CIWA:  CIWA-Ar Total: 2 COWS:  COWS Total Score: 0  Treatment Plan Summary: Daily contact with patient to assess and evaluate symptoms and progress in treatment Medication management  Plan: Supportive approach/coping skills/relapse prevention. ContinueTrazodone 50 mg Q bedtime for sleep. Encouraged out of room, participation in group sessions and application of coping skills when distressed. Will continue to monitor response to/adverse effects of medications in use to assure effectiveness. Continue to monitor mood, behavior and interaction with staff and other patients. Continue current plan of care.  Medical Decision Making Problem Points:  Review of last therapy session (1) and Review of psycho-social stressors (1) Data Points:  Review of medication regiment & side effects (2) Review of new medications or change in dosage (2)  I certify that inpatient services furnished can reasonably be expected to improve the patient's condition.   Armandina Stammer I, PMHNP, FNP-BC 03/30/2013, 2:42 PM

## 2013-03-30 NOTE — Progress Notes (Signed)
Patient did attend the evening speaker AA meeting.  

## 2013-03-30 NOTE — Progress Notes (Signed)
[facility] Group Notes:  (Nursing/MHT/Case Management/Adjunct)  Date:  03/30/2013  Time:  6:27 PM  Type of Therapy:  Psychoeducational Skills  Participation Level:  Did Not Attend  Summary of Progress/Problems: Pt was in bed asleep.  Caswell Corwin 03/30/2013, 6:27 PM

## 2013-03-31 MED ORDER — PAROXETINE HCL 40 MG PO TABS: 40.0000 mg | ORAL_TABLET | Freq: Every day | ORAL | Status: DC

## 2013-03-31 MED ORDER — GABAPENTIN 300 MG PO CAPS
600.0000 mg | ORAL_CAPSULE | Freq: Every day | ORAL | Status: DC
  Administered 2013-03-31 – 2013-04-01 (×2): 600 mg via ORAL
  Filled 2013-03-31 (×3): qty 2

## 2013-03-31 MED ORDER — IBUPROFEN 800 MG PO TABS
800.0000 mg | ORAL_TABLET | Freq: Four times a day (QID) | ORAL | Status: DC | PRN
  Administered 2013-03-31 – 2013-04-02 (×5): 800 mg via ORAL
  Filled 2013-03-31 (×2): qty 1
  Filled 2013-03-31: qty 20
  Filled 2013-03-31 (×4): qty 1

## 2013-03-31 MED ORDER — TRAZODONE HCL 50 MG PO TABS
50.0000 mg | ORAL_TABLET | Freq: Every evening | ORAL | Status: DC | PRN
  Administered 2013-03-31 – 2013-04-01 (×2): 50 mg via ORAL
  Filled 2013-03-31: qty 1
  Filled 2013-03-31: qty 28

## 2013-03-31 MED ORDER — ZOLPIDEM TARTRATE 10 MG PO TABS
10.0000 mg | ORAL_TABLET | Freq: Every evening | ORAL | Status: DC | PRN
  Administered 2013-03-31: 10 mg via ORAL
  Filled 2013-03-31: qty 1

## 2013-03-31 MED ORDER — ARIPIPRAZOLE 10 MG PO TABS: 10.0000 mg | ORAL_TABLET | Freq: Two times a day (BID) | ORAL | Status: DC

## 2013-03-31 MED ORDER — LEVOTHYROXINE SODIUM 100 MCG PO TABS: 100.0000 ug | ORAL_TABLET | Freq: Every morning | ORAL | Status: DC

## 2013-03-31 MED ORDER — GABAPENTIN 600 MG PO TABS: 600.0000 mg | ORAL_TABLET | Freq: Three times a day (TID) | ORAL | Status: DC

## 2013-03-31 NOTE — [facility] Suicide Risk Assessment (Signed)
[facility] INPATIENT:  Family/Significant Other Suicide Prevention Education  Suicide Prevention Education:  Patient Refusal for Family/Significant Other Suicide Prevention Education: The patient Edward [name] has refused to provide written consent for family/significant other to be provided Family/Significant Other Suicide Prevention Education during admission and/or prior to discharge.  Physician notified.  SPE completed with pt. SPI pamphlet provided to pt and he was encouraged to share information with his support network, ask questions, and talk about any concerns relating to SPE.   Smart, Mariela Rex LCSWA  03/31/2013, 3:49 PM

## 2013-03-31 NOTE — Progress Notes (Signed)
The focus of this group is to help patients review their daily goal of treatment and discuss progress on daily workbooks. Pt attended the evening group session and responded to all discussion prompts from the Writer. Pt shared that today was an okay day on the unit for him, the highlight of which was painting pictures with his fellow patients. Pt stated his only need from staff this evening was to receive his sleep medication and was referred by the Writer to speak with his RN following group. Pt's affect was appropriate.

## 2013-03-31 NOTE — Progress Notes (Signed)
Adult Psychoeducational Group Note  Date:  03/31/2013 Time:  10:00AM Group Topic/Focus:  therapuetic activity  Participation Level:  Did Not Attend    Additional Comments: Pt. Didn't attend group.   Bing Plume D 03/31/2013, 10:09 AM

## 2013-03-31 NOTE — [facility] Group Notes (Signed)
[facility] LCSW Aftercare Discharge Planning Group Note   03/31/2013 9:24 AM  Participation Quality:  DID NOT ATTEND   Smart, Lebron Quam

## 2013-03-31 NOTE — Progress Notes (Signed)
[facility] MD Progress Note  03/31/2013 3:50 PM Edward [name]  MRN:  [id number] Subjective:  Continues to have a hard time. States he is dealing with a lot of depression, anxiety  as well as suicidal ideas. States he is not sleeping well. He is still feeling hopeless, helpless. He states he is feeling like giving up. Admits he should not have relapsed, but even before relapse he was already starting to feel like this. Feeling like he was before he got the ECT. He states he would not try to hurt himself while he is here.. (thinks the Librium and the Clonidine are helping to bring him even more down) Diagnosis:   DSM5: Schizophrenia Disorders:  none Obsessive-Compulsive Disorders:  none Trauma-Stressor Disorders:  Posttraumatic Stress Disorder (309.81) Substance/Addictive Disorders:  Alcohol Related Disorder - Severe (303.90), Opioid Related Disorder-Severe Depressive Disorders:  Major Depressive Disorder - Severe (296.23)  Axis I: Mood Disorder NOS  ADL's:  Intact  Sleep: Poor  Appetite:  Fair  Suicidal Ideation:  Plan:  denies Intent:  denies Means:  denies Homicidal Ideation:  Plan:  denies Intent:  denies Means:  denies AEB (as evidenced by):  Psychiatric Specialty Exam: Review of Systems  Constitutional: Positive for malaise/fatigue.  HENT: Negative.   Eyes: Negative.   Respiratory: Negative.   Cardiovascular: Negative.   Gastrointestinal: Negative.   Genitourinary: Negative.   Musculoskeletal: Positive for back pain.  Skin: Negative.   Endo/Heme/Allergies: Negative.   Psychiatric/Behavioral: Positive for depression, suicidal ideas and substance abuse. The patient is nervous/anxious and has insomnia.     Blood pressure 102/64, pulse 69, temperature 97.6 F (36.4 C), temperature source Oral, resp. rate 20, height 5\' 10"  (1.778 m), weight 83.462 kg (184 lb).Body mass index is 26.4 kg/(m^2).  General Appearance: Disheveled  Eye Contact::  Minimal  Speech:  Clear and Coherent  and Slow  Volume:  Decreased  Mood:  Anxious, Depressed, Dysphoric, Hopeless, Irritable and Worthless  Affect:  Restricted  Thought Process:  Coherent and Goal Directed  Orientation:  Full (Time, Place, and Person)  Thought Content:  symptoms, concerns, worries, a sense of hopeless, helplessness  Suicidal Thoughts:  Yes.  without intent/plan  Homicidal Thoughts:  No  Memory:  Immediate;   Fair Recent;   Fair Remote;   Fair  Judgement:  Fair  Insight:  Shallow  Psychomotor Activity:  Restlessness  Concentration:  Fair  Recall:  Fair  Akathisia:  NA  Handed:    AIMS (if indicated):     Assets:  Desire for Improvement  Sleep:  Number of Hours: 4.25   Current Medications: Current Facility-Administered Medications  Medication Dose Route Frequency Provider Last Rate Last Dose  . alum & mag hydroxide-simeth (MAALOX/MYLANTA) 200-200-20 MG/5ML suspension 30 mL  30 mL Oral Q4H PRN Kristeen Mans, NP   30 mL at 03/31/13 1252  . ARIPiprazole (ABILIFY) tablet 10 mg  10 mg Oral BID AC [name], NP   10 mg at 03/31/13 0604  . chlordiazePOXIDE (LIBRIUM) capsule 50 mg  50 mg Oral Once Kristeen Mans, NP      . dicyclomine (BENTYL) tablet 20 mg  20 mg Oral Q6H PRN Kristeen Mans, NP      . gabapentin (NEURONTIN) capsule 600 mg  600 mg Oral TID Rachael Fee, MD   600 mg at 03/31/13 1248  . gabapentin (NEURONTIN) capsule 600 mg  600 mg Oral QHS Rachael Fee, MD      . ibuprofen (ADVIL,MOTRIN) tablet 800  mg  800 mg Oral Q6H PRN Rachael Fee, MD      . levothyroxine (SYNTHROID, LEVOTHROID) tablet 100 mcg  100 mcg Oral Daily Kristeen Mans, NP   100 mcg at 03/31/13 1004  . loperamide (IMODIUM) capsule 2-4 mg  2-4 mg Oral PRN Kristeen Mans, NP      . magnesium hydroxide (MILK OF MAGNESIA) suspension 30 mL  30 mL Oral Daily PRN Kristeen Mans, NP      . methocarbamol (ROBAXIN) tablet 500 mg  500 mg Oral Q8H PRN Kristeen Mans, NP   500 mg at 03/28/13 4782  . multivitamin with minerals tablet 1 tablet  1  tablet Oral Daily Kristeen Mans, NP   1 tablet at 03/31/13 1002  . nicotine polacrilex (NICORETTE) gum 2 mg  2 mg Oral PRN Rachael Fee, MD   2 mg at 03/30/13 1945  . ondansetron (ZOFRAN-ODT) disintegrating tablet 4 mg  4 mg Oral Q6H PRN Kristeen Mans, NP      . PARoxetine (PAXIL) tablet 40 mg  40 mg Oral Daily [name], NP   40 mg at 03/31/13 1003  . thiamine (B-1) injection 100 mg  100 mg Intramuscular Once Kristeen Mans, NP      . thiamine (VITAMIN B-1) tablet 100 mg  100 mg Oral Daily Kristeen Mans, NP   100 mg at 03/31/13 1005  . zolpidem (AMBIEN) tablet 10 mg  10 mg Oral QHS PRN Rachael Fee, MD        Lab Results: No results found for this or any previous visit (from the past 48 hour(s)).  Physical Findings: AIMS: Facial and Oral Movements Muscles of Facial Expression: None, normal Lips and Perioral Area: None, normal Jaw: None, normal Tongue: None, normal,Extremity Movements Upper (arms, wrists, hands, fingers): None, normal Lower (legs, knees, ankles, toes): None, normal, Trunk Movements Neck, shoulders, hips: None, normal, Overall Severity Severity of abnormal movements (highest score from questions above): None, normal Incapacitation due to abnormal movements: None, normal Patient's awareness of abnormal movements (rate only patient's report): No Awareness, Dental Status Current problems with teeth and/or dentures?: No Does patient usually wear dentures?: No  CIWA:  CIWA-Ar Total: 6 COWS:  COWS Total Score: 0  Treatment Plan Summary: Daily contact with patient to assess and evaluate symptoms and progress in treatment Medication management  Plan: Supportive approach/coping skills/relapse prevention           Increase the Neurontin to 600 TID and HS           States he has used Ambien and that it helps more than the Trazodone.            Will reassess for a possible increase in Paxil Medical Decision Making Problem Points:  Established problem, worsening (2) and  Review of psycho-social stressors (1) Data Points:  Review of medication regiment & side effects (2) Review of new medications or change in dosage (2)  I certify that inpatient services furnished can reasonably be expected to improve the patient's condition.   Yekaterina Escutia A 03/31/2013, 3:50 PM

## 2013-03-31 NOTE — Clinical Social Work Note (Signed)
Pt has bed available/screening at [facility] on Monday 12/11. Also, Melissa from [facility] will do prescreen with pt for possible Wed admission at 1:00PM 04/01/13.  [employer], LCSWA  03/31/2013 3:26 PM

## 2013-03-31 NOTE — [facility] Group Notes (Signed)
[facility] LCSW Group Therapy  03/31/2013 3:10 PM  Type of Therapy:  Group Therapy  Participation Level:  Active  Participation Quality:  Attentive  Affect:  Appropriate  Cognitive:  Alert and Oriented  Insight:  Engaged  Engagement in Therapy:  Engaged  Modes of Intervention:  Confrontation, Discussion, Education, Exploration, Limit-setting, Rapport Building, Socialization and Support  Summary of Progress/Problems: Today's Topic: Overcoming Obstacles. Pt identified obstacles faced currently and processed barriers involved in overcoming these obstacles. Pt identified steps necessary for overcoming these obstacles and explored motivation (internal and external) for facing these difficulties head on. Pt further identified one area of concern in their lives and chose a skill of focus pulled from their "toolbox." Shell was attentive and engaged throughout today's therapy group. He stated that an obstacle he faces is "homelessness." He stated that he plans to enter an inpatient treatment facility and hopes to learn skills to help him remain clean and avoid relapse. Audrick stated that he will receive a check soon that will help him with his plan to move to [place], [place] and "start fresh." Rehan explained that his plan is to [facility] to [facility], a [facility] that has group therapy for SA and MI. Holger shows progress in the group setting AEB his ability to actively participate in group discussion and shows improving insight AEB his ability to identify obstacles, and formulate ways to overcome these obstacles.    Smart, Suzy Kugel LCSWA  03/31/2013, 3:10 PM

## 2013-03-31 NOTE — Progress Notes (Signed)
Adult Psychoeducational Group Note  Date:  03/31/2013 Time:  11:00AM Group Topic/Focus:  Wellness Toolbox:   The focus of this group is to discuss various aspects of wellness, balancing those aspects and exploring ways to increase the ability to experience wellness.  Patients will create a wellness toolbox for use upon discharge.  Participation Level:  Did Not Attend   Additional Comments:  Pt. Didn't attend group.   Bing Plume D 03/31/2013, 3:08 PM

## 2013-03-31 NOTE — Progress Notes (Signed)
Patient ID: Edward [name], male   DOB: June 01, 1960, 52 y.o.   MRN: [id number] He was in bed this AM. He said" that he was to tired d/t the his medication and that he was not going to take any more librium and clonidine because it made him feel worse.  (see new orders). He has been up and to group and interacting with peers this PM.  Ha received prn IBP  For knee pain, that was helpful.

## 2013-03-31 NOTE — Progress Notes (Signed)
Patient ID: Edward [name], male   DOB: 02-10-1961, 52 y.o.   MRN: [id number] He refused to take his po medication at scheduled time , wants it later.  Just wants to be left alone so he can sleep.

## 2013-04-01 ENCOUNTER — Encounter: Payer: Self-pay | Admitting: Internal Medicine

## 2013-04-01 NOTE — Progress Notes (Signed)
D:Pt reports that he has suicidal thoughts and made the gesture of a gun to the top of his mouth. Pt states "I would rather go to hell" than live. Pt said that he believes he will go to hell if he kills himself. Pt reports that he thinks negative and does not care about the future. A:Supported pt to discuss feelings. Offered encouragement, redirection and 15 minute checks. R:Pt contracts with staff for safety. Safety maintained on the unit.

## 2013-04-01 NOTE — [facility] Group Notes (Signed)
Adult Psychoeducational Group Note  Date:  04/01/2013 Time:  10:01 PM  Group Topic/Focus:  AA Meeting  Participation Level:  Did Not Attend  Participation Quality:  None  Affect:  None  Cognitive: None  Insight: None  Engagement in Group:  None  Modes of Intervention:  Discussion and Education  Additional Comments:  Bluford did not attend group.  Caroll Rancher A 04/01/2013, 10:01 PM

## 2013-04-01 NOTE — Progress Notes (Signed)
Recreation Therapy Notes  Date: 12.09.2014 Time: 3:00pm Location: [address]   Group Topic: Communication, Team Building, Problem Solving  Goal Area(s) Addresses:  Patient will effectively work with peer towards shared goal.  Patient will identify skill used to make activity successful.  Patient will identify how skills used during activity can be used to reach post d/c goals.   Behavioral Response: Absent, Appropriate  Intervention: Problem Solving Activitiy  Activity: Life Boat. Patients were given a scenario about being on a sinking yacht. Patients were informed the yacht included 15 guest, 8 of which could be placed on the life boat, along with all group members. Individuals on guest list were of varying socioeconomic classes such as a Education officer, museum, Materials engineer, Midwife, Tree surgeon.   Education: Pharmacist, community, Discharge Planning   Education Outcome: Acknowledges understanding  Clinical Observations/Feedback: Patient exited group session without explanation as activity was starting. Patient returned to group session during group discussion.    Marykay Lex Taiwo Fish, LRT/CTRS  Jearl Klinefelter 04/01/2013 4:56 PM

## 2013-04-01 NOTE — Progress Notes (Signed)
Patient ID: Edward [name], male   DOB: 1960/05/02, 52 y.o.   MRN: [id number] He has been up and to groups interacting with peers and staff. He had a phone interview that he said went very well. He reported that he had pulled the skin from the bottom of rt heel and that it had bleed during the night.  Right heel had a one inch area that has layer of skin gone. No bleeding noted. Nurse Extender  informed. Area covered with large bandage. Also has two small area on top of left foot crusted over from where he said he scratched it during the night. Mood has been brighter today.

## 2013-04-01 NOTE — Progress Notes (Signed)
Recreation Therapy Notes  Animal-Assisted Activity/Therapy (AAA/T) Program Checklist/Progress Notes Patient Eligibility Criteria Checklist & Daily Group note for Rec Tx Intervention  Date: 12.09.2014  Time: 2:30pm Location: [address]    AAA/T Program Assumption of Risk Form signed by Patient/ or Parent Legal Guardian yes  Patient is free of allergies or sever asthma  yes  Patient reports no fear of animals yes  Patient reports no history of cruelty to animals yes  Patient understands his/her participation is voluntary yes  Patient washes hands before animal contact yes  Patient washes hands after animal contact yes  Behavioral Response: Appropriate  Education: Hand Washing, Appropriate Animal Interaction   Education Outcome: Acknowledges understanding.   Marykay Lex Kyliegh Jester, LRT/CTRS  Allene Furuya L 04/01/2013 4:18 PM

## 2013-04-01 NOTE — [facility] Group Notes (Signed)
[facility] LCSW Group Therapy  04/01/2013 1:31 PM  Type of Therapy:  Group Therapy  Participation Level:  Did Not Attend-Pt asleep  [name], Edward Sherley LCSWA 04/01/2013, 1:31 PM
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
## 2013-04-01 NOTE — Progress Notes (Signed)
Adult Psychoeducational Group Note  Date:  04/01/2013 Time:  11:00AM Group Topic/Focus:  Recovery Goals:   The focus of this group is to identify appropriate goals for recovery and establish a plan to achieve them.  Participation Level:  Did Not Attend   Additional Comments:  Pt. Didn't attend group.   Juanda Chance Jvette 04/01/2013, 11:53 AM

## 2013-04-01 NOTE — Progress Notes (Signed)
Pt was up at the nurse's station three times since getting his initial HS meds.  He had dozed off earlier and writer put his meds back in the med drawer.  Around 2255, pt came up for his HS meds.  He received Ambien 10mg  at that time.  Pt came back to the NS at 2345 and stated that he was not able to sleep and was insistent that he needed more meds.  He was also still c/o knee pain and wanted a Toradol injection.  PA was called who said pt had already received an appropriate dose of anti-inflammatory and did not order the Toradol.  Pt was given a prn order for Trazodone 50mg  with a repeat of 50mg .  At that time, pt was given the Trazodone 50mg .  After about 30 min, pt returned to the NS irritable and wanting something done about his insomnia and pain.  Pt was given Robaxin 500mg  and the repeat Trazodone 50mg .  Pt returned to his room.  At this time, pt is asleep.  Continue to monitor q15 minutes for safety.

## 2013-04-01 NOTE — Progress Notes (Signed)
Pt reports he is doing ok this evening.  He is concerned about getting sleep medicine and being able to sleep.  Pt denies SI/HI/AV.  He says he is having minimal withdrawal symptoms.  His main complaint is his chronic knee pain.  Meds were given per orders.  Pt states he is looking for long term treatment after detox.  Pt makes his needs known to staff.  Support and encouragement offered.  Safety maintained with q15 minute checks.

## 2013-04-01 NOTE — Progress Notes (Signed)
Patient did not attend 10 am therapeutic group

## 2013-04-02 ENCOUNTER — Encounter: Payer: Self-pay | Admitting: Internal Medicine

## 2013-04-02 LAB — TSH: TSH: 0.588 u[IU]/mL (ref 0.350–4.500)

## 2013-04-02 LAB — T3, FREE: T3, Free: 2.5 pg/mL (ref 2.3–4.2)

## 2013-04-02 MED ORDER — IBUPROFEN 800 MG PO TABS: 800.0000 mg | ORAL_TABLET | Freq: Four times a day (QID) | ORAL | Status: DC | PRN

## 2013-04-02 MED ORDER — GABAPENTIN 600 MG PO TABS: ORAL_TABLET | ORAL | Status: DC

## 2013-04-02 MED ORDER — ARIPIPRAZOLE 10 MG PO TABS: 10.0000 mg | ORAL_TABLET | Freq: Two times a day (BID) | ORAL | Status: DC

## 2013-04-02 MED ORDER — NICOTINE 21 MG/24HR TD PT24
21.0000 mg | MEDICATED_PATCH | Freq: Every day | TRANSDERMAL | Status: DC
  Administered 2013-04-02: 21 mg via TRANSDERMAL
  Filled 2013-04-02 (×2): qty 1

## 2013-04-02 MED ORDER — TRAZODONE HCL 50 MG PO TABS
50.0000 mg | ORAL_TABLET | Freq: Every evening | ORAL | Status: DC | PRN
Start: 2013-04-02 — End: 2013-06-17

## 2013-04-02 MED ORDER — PANTOPRAZOLE SODIUM 40 MG PO TBEC
40.0000 mg | DELAYED_RELEASE_TABLET | Freq: Every day | ORAL | Status: DC
  Administered 2013-04-02: 40 mg via ORAL
  Filled 2013-04-02 (×2): qty 1

## 2013-04-02 MED ORDER — PANTOPRAZOLE SODIUM 40 MG PO TBEC: 40.0000 mg | DELAYED_RELEASE_TABLET | Freq: Every day | ORAL | Status: DC

## 2013-04-02 MED ORDER — GABAPENTIN 600 MG PO TABS
600.0000 mg | ORAL_TABLET | Freq: Four times a day (QID) | ORAL | Status: DC
  Filled 2013-04-02 (×4): qty 56

## 2013-04-02 NOTE — [facility] Suicide Risk Assessment (Signed)
Suicide Risk Assessment  Discharge Assessment     Demographic Factors:  Male and Caucasian  Mental Status Per Nursing Assessment::   On Admission:     Current Mental Status by Physician: In full contact with reality. There are no suicidal ideas, plans or intent. His mood is euthymic, his affect is appropriate. He is willing and motivated to pursue further rehab. Will go to [facility].    Loss Factors: NA  Historical Factors: NA  Risk Reduction Factors:   NA  Continued Clinical Symptoms:  Alcohol/Substance Abuse/Dependencies  Cognitive Features That Contribute To Risk:  Closed-mindedness Polarized thinking Thought constriction (tunnel vision)    Suicide Risk: mild   Discharge Diagnoses:   AXIS I:  Alcohol Dependence, Cocaine Abuse, PTSD, Major Depression AXIS II:  Deferred AXIS III:   Past Medical History  Diagnosis Date  . Thyroid disease   . Hypothyroidism   . Mental disorder   . Depression   . Anxiety and depression   . Major depression   . PTSD (post-traumatic stress disorder)   . Hepatitis     hep C  . Meniscus tear 02/28/2013    Right side  . Neuromuscular disorder     pinched nerve (on 02/28/13 pt denies having this problem)   AXIS IV:  other psychosocial or environmental problems AXIS V:  61-70 mild symptoms  Plan Of Care/Follow-up recommendations:  Activity:  as tolerated Diet:  regular Follow up [facility] Is patient on multiple antipsychotic therapies at discharge:  No   Has Patient had three or more failed trials of antipsychotic monotherapy by history:  No  Recommended Plan for Multiple Antipsychotic Therapies: NA  Edward [name] A 04/02/2013, 10:57 AM

## 2013-04-02 NOTE — Progress Notes (Signed)
Patient ID: Edward [name], male   DOB: May 06, 1960, 52 y.o.   MRN: [id number] Patient discharged per physician order; patient denies SI/HI and A/Vhallucinations; patient received prescriptions; samples, and copy of AVS after it reviewed; patient had no other questions or concerns at this time; patient left the unit ambulatory with [facility] represenative

## 2013-04-02 NOTE — Progress Notes (Signed)
Adult Psychoeducational Group Note  Date:  04/02/2013 Time:  3:54 PM  Group Topic/Focus:  Personal Choices and Values:   The focus of this group is to help patients assess and explore the importance of values in their lives, how their values affect their decisions, how they express their values and what opposes their expression.  Participation Level:  Active  Participation Quality:  Appropriate and Attentive  Affect:  Appropriate  Cognitive:  Appropriate  Insight: Good  Engagement in Group:  Engaged  Modes of Intervention:  Discussion, Education and Support  Additional Comments:  Pt was encouraged to remember a time with they were the happiest. Pt recalled when his  Daughter was born. He states this was the only time when he was happy and that was many years ago. Pt hopes to get a job and find a place to live.  Reyn[place] Bowl 04/02/2013, 3:54 PM

## 2013-04-02 NOTE — Clinical Social Work Note (Signed)
Melissa from [facility] called. New labs (synthroid levels) must be faxed, RN must call stating that pt was able to walk up stairs, and pt must be taken off Ambien to be accepted. Labs faxed by CSW. MD/RN made aware of other conditions. Tentatively accepted/pickup scheduled for 1:30PM   [employer], LCSWA  04/02/2013 9:41 AM

## 2013-04-02 NOTE — Progress Notes (Signed)
Pt attended pharmacy group. 

## 2013-04-02 NOTE — Progress Notes (Signed)
Patient no showed

## 2013-04-02 NOTE — Progress Notes (Signed)
[facility] Adult Case Management Discharge Plan :  Will you be returning to the same living situation after discharge: No. Pt going to [facility] at d/c.  At discharge, do you have transportation home?:Yes,  [facility] providing transport at 1:30PM today.  Do you have the ability to pay for your medications:Yes,  Medicaid  Release of information consent forms completed and in the chart;  Patient's signature needed at discharge.  Patient to Follow up at: Follow-up Information   Follow up with [facility]. (Walk in clinic is Monday - Friday 8 am - 3 pm, for medication management and therapy)    Contact information:   [address], [place] [address] Phone: [phone] Fax: [phone]      Follow up with [facility] On 04/02/2013. ([facility] will arive by 1:30PM on this day to transport you to facility. )    Contact information:   [address] [place], [place] [address] Phone: [phone] Fax: [phone]      Patient denies SI/HI:   Yes,  in group/self report.     Safety Planning and Suicide Prevention discussed:  Yes,  SPE completed with pt as he did not consent to family contact. SPI pamphlet provided to pt and he was encouraged to share this information with his support network, ask questions, and talk about any concerns.   Smart, Alyssa Rotondo LCSWA  04/02/2013, 11:14 AM

## 2013-04-02 NOTE — BHH Group Notes (Signed)
[facility] LCSW Aftercare Discharge Planning Group Note   04/02/2013 10:04 AM  Participation Quality:  DID NOT ATTEND - Pt sleeping   Smart, Gerold Sar LCSWA

## 2013-04-02 NOTE — Discharge Summary (Signed)
Physician Discharge Summary Note  Patient:  Edward [name] is an 52 y.o., male MRN:  [id number] DOB:  17-Oct-1960 Patient phone:  [phone] (home)  Patient address:   [address] [place] [address],   Date of Admission:  03/27/2013 Date of Discharge: 04/02/13  Reason for Admission:  Opioid, alcohol detox  Discharge Diagnoses: Active Problems:   Alcohol dependence   Major depression   Cocaine abuse   PTSD (post-traumatic stress disorder)   Alcohol abuse   Opioid dependence  Review of Systems  Constitutional: Negative.   HENT: Negative.   Eyes: Negative.   Respiratory: Negative.   Cardiovascular: Negative.   Gastrointestinal: Negative.   Genitourinary: Negative.   Musculoskeletal: Negative.   Skin: Negative for itching and rash.       Several tattoos to body areas.  Neurological: Negative.   Endo/Heme/Allergies: Negative.   Psychiatric/Behavioral: Positive for depression (Stabilized with medication prior to discharge) and substance abuse (Alcohol dependence, Cocaine abuse, Opioid dependence). Negative for suicidal ideas, hallucinations and memory loss. The patient is nervous/anxious (Stabilized with medication prior to discharge) and has insomnia (Stabilized with medication prior to discharge).     DSM5: Schizophrenia Disorders:  NA Obsessive-Compulsive Disorders:  NA Trauma-Stressor Disorders:  Posttraumatic Stress Disorder (309.81) Substance/Addictive Disorders:  Alcohol Intoxication with Use Disorder - Severe (F10.229) and Inhalant Use Disorder - Severe (304.60), Cocaine dependence Depressive Disorders:  Major Depressive Disorder - Moderate (296.22)  Axis Diagnosis:   AXIS I:  Alcohol Intoxication with Use Disorder - Severe (F10.229) and Inhalant Use Disorder - Severe (304.60), Major depressive disorder, AXIS II:  Deferred AXIS III:   Past Medical History  Diagnosis Date  . Thyroid disease   . Hypothyroidism   . Mental disorder   . Depression   .  Anxiety and depression   . Major depression   . PTSD (post-traumatic stress disorder)   . Hepatitis     hep C  . Meniscus tear 02/28/2013    Right side  . Neuromuscular disorder     pinched nerve (on 02/28/13 pt denies having this problem)   AXIS IV:  other psychosocial or environmental problems and Polysubstance dependence AXIS V:  61  Level of Care:  [facility]  Hospital Course: 52 y.o. male who presents to the Emergency Department for Medical Clearance. Pt states he was partying last night and he "overdid it". Last night he smoked heroin, crack cocaine and drank alcohol. Pt states having SI and no suicidal plans. He does not have access to weapons or guns. He takes Gabapentin and Abilify medication while at home. He is a [facility] at [facility]. Pt denies drinking alcohol but last night he "slipped up"---drank alcohol, used heroine and cocaine. He is experiencing pain in his right knee. Pt has a hx of depression and PTSD.  After admission assessment and while a patient is this hospital, coupled with the UDS/toxicology reports that showed (+) Alcohol, opiates, cocaine and THC. It was then determined that he will need detoxification treatment protocols to re-stabilize his systems of alcohol/drug intoxications. He was then ordered abd received both clonidine and Librium detoxification treatment protocols. He was also enrolled in group counseling sessions and activities where he was counseled, taught and learned coping skills that should help him after discharge to cope better and manage his substance abuse issues to maintain sobriety.  He also received medication management and monitoring for his other depressive mood symptoms. He was ordered and received, Abilify 10 mg daily for mood control,  Paroxetine 40 mg daily for depression, Gabapentin 600 mg Qid for anxiety and Trazodone 50 mg Q bedtime for sleep. Edward [name] presented other previously existing health issues that required treatment and or monitoring.  He received medication management for all those health issues. He was monitored closely for any potential problems that may arise as a result of his treatment. He tolerated his treatment regimen without any significant adverse effects and or reactions reported.   Edward [name] has successfully completed his detoxifcation treatments. He is currently being discharged to [facility] treatment [facility] in [place] for further substance abuse treatment. He will leave [facility] after discharge and will be transported to [facility] by the [facility] treatment center staff. And for medication management and routine psychiatric care, he will follow-up at the [facility] here in [place], . He has been informed that this is a walk-in appointment between the hours of 08:00 - 03:00 pm, Monday thru Friday. The addresses, dates, times and contact information for all the appointments provided for him in writing.  Upon discharge, patient adamantly denies any suicidal, homicidal ideations,delusional thought, auditory, visual hallucinations and or withdrawal symptoms. He received 14 days worth supply sample of his [facility] discharge medications. Transportation per [facility].  Consults:  psychiatry  Significant Diagnostic Studies:  labs: CBC with diff, CMP, UDS, toxicology tests, U/A, HIV, TSH, T3, T4.  Discharge Vitals:   Blood pressure 112/71, pulse 63, temperature 97.9 F (36.6 C), temperature source Oral, resp. rate 18, height 5\' 10"  (1.778 m), weight 83.462 kg (184 lb). Body mass index is 26.4 kg/(m^2). Lab Results:   Results for orders placed during the hospital encounter of 03/27/13 (from the past 72 hour(s))  TSH     Status: None   Collection Time    04/01/13  8:00 PM      Result Value Range   TSH 0.588  0.350 - 4.500 uIU/mL   Comment: Performed at [facility]  T3, FREE     Status: None   Collection Time    04/01/13  8:00 PM      Result Value Range   T3, Free 2.5  2.3 - 4.2 pg/mL   Comment: Performed at [facility]  T4, FREE     Status: None   Collection Time    04/01/13  8:00 PM      Result Value Range   Free T4 1.12  0.80 - 1.80 ng/dL   Comment: Performed at [facility]  HIV ANTIBODY (ROUTINE TESTING)     Status: None   Collection Time    04/01/13  8:00 PM      Result Value Range   HIV NON REACTIVE  NON REACTIVE   Comment: Performed at [facility]    Physical Findings: AIMS: Facial and Oral Movements Muscles of Facial Expression: None, normal Lips and Perioral Area: None, normal Jaw: None, normal Tongue: None, normal,Extremity Movements Upper (arms, wrists, hands, fingers): None, normal Lower (legs, knees, ankles, toes): None, normal, Trunk Movements Neck, shoulders, hips: None, normal, Overall Severity Severity of abnormal movements (highest score from questions above): None, normal Incapacitation due to abnormal movements: None, normal Patient's awareness of abnormal movements (rate only patient's report): No Awareness, Dental Status Current problems with teeth and/or dentures?: No Does patient usually wear dentures?: No  CIWA:  CIWA-Ar Total: 0 COWS:  COWS Total Score: 1  Psychiatric Specialty Exam: See Psychiatric Specialty Exam and Suicide Risk Assessment completed by Attending Physician prior to discharge.  Discharge destination:  [facility]  Is patient on multiple antipsychotic therapies at discharge:  No   Has Patient had three or more failed trials of antipsychotic monotherapy by history:  No  Recommended Plan for Multiple Antipsychotic Therapies: NA      Discharge Orders   Future Orders Complete By Expires   Activity as tolerated - No restrictions  As directed    Diet - low sodium heart healthy  As directed        Medication List    STOP taking these medications       naproxen 500 MG tablet  Commonly known as:  NAPROSYN      TAKE these medications     Indication   ARIPiprazole 10 MG tablet  Commonly known as:  ABILIFY  Take 1  tablet (10 mg total) by mouth 2 (two) times daily. For mood control.   Indication:  Mood control     gabapentin 600 MG tablet  Commonly known as:  NEURONTIN  Take 1 tablet (600 mg) Four times daily:For substance withdrawal related anxiety.   Indication:  Agitation, Pain, Substance withdrawal related anxiety     ibuprofen 800 MG tablet  Commonly known as:  ADVIL,MOTRIN  Take 1 tablet (800 mg total) by mouth every 6 (six) hours as needed for moderate pain.   Indication:  Joint Damage causing Pain and Loss of Function, Pain     levothyroxine 100 MCG tablet  Commonly known as:  SYNTHROID, LEVOTHROID  Take 1 tablet (100 mcg total) by mouth every morning. For thyroid disease.   Indication:  Underactive Thyroid     pantoprazole 40 MG tablet  Commonly known as:  PROTONIX  Take 1 tablet (40 mg total) by mouth daily. For acid reflux   Indication:  Acid reflux     PARoxetine 40 MG tablet  Commonly known as:  PAXIL  Take 1 tablet (40 mg total) by mouth daily.   Indication:  Major Depressive Disorder     traZODone 50 MG tablet  Commonly known as:  DESYREL  Take 1 tablet (50 mg total) by mouth at bedtime as needed and may repeat dose one time if needed for sleep.   Indication:  Trouble Sleeping       Follow-up Information   Follow up with [facility]. (Walk in clinic is Monday - Friday 8 am - 3 pm, for medication management and therapy)    Contact information:   [address] [place] [address] Phone: [phone] Fax: [phone]      Follow up with [facility] On 04/02/2013. ([facility] will arive by 1:30PM on this day to transport you to facility. )    Contact information:   [address] [place], [place] [address] Phone: [phone] Fax: [phone]     Follow-up recommendations: Activity:  As tolerated Diet: As recommended by your primary care doctor. Keep all scheduled follow-up appointments as recommended.  Continue to work your relapse prevention plan Comments:  Take all  your medications as prescribed by your mental healthcare provider. Report any adverse effects and or reactions from your medicines to your outpatient provider promptly. Patient is instructed and cautioned to not engage in alcohol and or illegal drug use while on prescription medicines. In the event of worsening symptoms, patient is instructed to call the crisis hotline, 911 and or go to the nearest ED for appropriate evaluation and treatment of symptoms. Follow-up with your primary care provider for your other medical issues, concerns and or health care needs.  Total Discharge Time:  Greater than 30 minutes.  Signed: Sanjuana Kava, PMHNP, FNP-BC 04/02/2013, 11:10 AM Agree with assessment and plan Reymundo Poll. Dub Mikes, M.D.

## 2013-04-02 NOTE — Progress Notes (Signed)
Patient ID: Edward [name], male   DOB: Jun 28, 1960, 52 y.o.   MRN: [id number]  D: Pt stated he's still depressed but that he's detoxed. Stated that he had a telephone interview with a rep from [facility] today. Stated, "I'm pretty articulate so it was a nice conversation. Stated he's hoping to be discharged by the 11th if theres a bed available. After 2 wks pt plans to go to [facility] which is a 8 step program.  A:  Support and encouragement was offered. 15 min checks continued for safety.  R: Pt remains safe.

## 2013-04-02 NOTE — Tx Team (Signed)
Interdisciplinary Treatment Plan Update (Adult)  Date: 04/02/2013  Time Reviewed:  11:10 AM   Progress in Treatment: Attending groups: Intermittently  Participating in groups:  Yes, when he attends  Taking medication as prescribed:  Yes Tolerating medication:  Yes Family/Significant othe contact made: Pt refused family contact. SPE completed with pt.  Patient understands diagnosis:  Yes Discussing patient identified problems/goals with staff:  Yes Medical problems stabilized or resolved:  Yes Denies suicidal/homicidal ideation: Yes Issues/concerns per patient self-inventory:  Yes Other:  New problem(s) identified: N/A  Discharge Plan or Barriers: Accepted into [facility]. Pt to be transported to [facility] at 1:30PM. PA/MD/RN staff made aware of this and that pt needs 14 day med supply.   Reason for Continuation of Hospitalization: d/c today  Comments: N/A  Estimated length of stay: d/c today   For review of initial/current patient goals, please see plan of care.  Attendees: Patient:     Family:     Physician:  04/02/2013 11:09 AM   Nursing:   Sue Lush RN  04/02/2013 11:09 AM   Clinical Social Worker: Trula Slade, LCSWA   04/02/2013 11:09 AM   Other: Onnie Boer, RN case manager 04/02/2013 11:09 AM   Other: Chandra Batch. PA 04/02/2013 11:09 AM   Other: Lowanda Foster RN 04/02/2013 11:09 AM   Other:     Other:    Other:    Other:    Other:    Other:    Other:     Scribe for Treatment Team:   Trula Slade, LCSWA  04/02/2013 , 11:09 AM

## 2013-04-07 NOTE — Progress Notes (Signed)
Patient Discharge Instructions:  After Visit Summary (AVS):   Faxed to:  04/07/13 Discharge Summary Note:   Faxed to:  04/07/13 Psychiatric Admission Assessment Note:   Faxed to:  04/07/13 Suicide Risk Assessment - Discharge Assessment:   Faxed to:  04/07/13 Faxed/Sent to the Next Level Care provider:  04/07/13 Faxed to [facility] @ [phone] Faxed to [facility] @ [phone]  Jerelene Redden, 04/07/2013, 3:01 PM

## 2013-06-12 ENCOUNTER — Emergency Department (HOSPITAL_COMMUNITY)
Admission: EM | Admit: 2013-06-12 | Discharge: 2013-06-13 | Disposition: A | Discharge status: Other Acute Inpatient Hospital | Payer: Medicaid Other | Attending: Emergency Medicine | Admitting: Emergency Medicine

## 2013-06-12 ENCOUNTER — Encounter (HOSPITAL_COMMUNITY): Payer: Self-pay | Admitting: Emergency Medicine

## 2013-06-12 DIAGNOSIS — Z79899 Other long term (current) drug therapy: Secondary | ICD-10-CM | POA: Insufficient documentation

## 2013-06-12 DIAGNOSIS — R0602 Shortness of breath: Secondary | ICD-10-CM | POA: Insufficient documentation

## 2013-06-12 DIAGNOSIS — F17200 Nicotine dependence, unspecified, uncomplicated: Secondary | ICD-10-CM | POA: Insufficient documentation

## 2013-06-12 DIAGNOSIS — Z87828 Personal history of other (healed) physical injury and trauma: Secondary | ICD-10-CM | POA: Insufficient documentation

## 2013-06-12 DIAGNOSIS — G479 Sleep disorder, unspecified: Secondary | ICD-10-CM | POA: Insufficient documentation

## 2013-06-12 DIAGNOSIS — F1010 Alcohol abuse, uncomplicated: Secondary | ICD-10-CM

## 2013-06-12 DIAGNOSIS — R630 Anorexia: Secondary | ICD-10-CM | POA: Insufficient documentation

## 2013-06-12 DIAGNOSIS — F329 Major depressive disorder, single episode, unspecified: Secondary | ICD-10-CM | POA: Insufficient documentation

## 2013-06-12 DIAGNOSIS — F1020 Alcohol dependence, uncomplicated: Secondary | ICD-10-CM

## 2013-06-12 DIAGNOSIS — R443 Hallucinations, unspecified: Secondary | ICD-10-CM | POA: Insufficient documentation

## 2013-06-12 DIAGNOSIS — R45851 Suicidal ideations: Secondary | ICD-10-CM

## 2013-06-12 DIAGNOSIS — F4310 Post-traumatic stress disorder, unspecified: Secondary | ICD-10-CM | POA: Insufficient documentation

## 2013-06-12 DIAGNOSIS — E039 Hypothyroidism, unspecified: Secondary | ICD-10-CM | POA: Insufficient documentation

## 2013-06-12 DIAGNOSIS — Z8619 Personal history of other infectious and parasitic diseases: Secondary | ICD-10-CM | POA: Insufficient documentation

## 2013-06-12 DIAGNOSIS — R0789 Other chest pain: Secondary | ICD-10-CM | POA: Insufficient documentation

## 2013-06-12 DIAGNOSIS — F411 Generalized anxiety disorder: Secondary | ICD-10-CM | POA: Insufficient documentation

## 2013-06-12 DIAGNOSIS — F32A Depression, unspecified: Secondary | ICD-10-CM

## 2013-06-12 LAB — RAPID URINE DRUG SCREEN, HOSP PERFORMED
Amphetamines: NOT DETECTED
Barbiturates: NOT DETECTED
Benzodiazepines: NOT DETECTED
Cocaine: NOT DETECTED
Opiates: NOT DETECTED
Tetrahydrocannabinol: POSITIVE — AB

## 2013-06-12 NOTE — ED Provider Notes (Signed)
CSN: [id number]     Arrival date & time 06/12/13  2301 History  This chart was scribed for non-physician practitioner Francee PiccoloJennifer Linc Renne, PA-C working with April Smitty CordsK Palumbo-Rasch, MD by Dorothey Basemania Sutton, ED Scribe. This patient was seen in room WTR2/WLPT2 and the patient's care was started at 11:36 PM.    Chief Complaint  Patient presents with  . Medical Clearance   The history is provided by the patient. No language interpreter was used.   HPI Comments: Edward [name] is a 53 y.o. male with a history of depression, anxiety, and PTSD brought in by [employer] who presents to the Emergency Department requesting medical clearance for increasing depression with suicidal ideation. Patient also reports associated auditory hallucinations that tell him that he is "no good" and he should "kill himself."  He states that he has thoughts of harming himself daily, but denies forming a plan or intent of carrying out these thoughts. He denies visual hallucinations, homicidal ideation. He admits to using alcohol and marijuana. He is complaining of 2-3 weeks of diffuse chest tightness without associated cough, shortness of breath, fever, nasal congestion, rhinorrhea, abdominal pain, nausea, vomiting, diarrhea. Patient also has a history of hypothyroidism and Hepatitis C.  Patient reports some chest tightness that he states has been constant progressively worsening. No alleviating or aggravating factors. He reports a familial history of MI (father, age 53). He denies emesis, abdominal pain.  Past Medical History  Diagnosis Date  . Thyroid disease   . Hypothyroidism   . Mental disorder   . Depression   . Anxiety and depression   . Major depression   . PTSD (post-traumatic stress disorder)   . Hepatitis     hep C  . Meniscus tear 02/28/2013    Right side  . Neuromuscular disorder     pinched nerve (on 02/28/13 pt denies having this problem)   Past Surgical History  Procedure Laterality Date  . No past surgeries    .  Testiclar cyst excision     History reviewed. No pertinent family history. History  Substance Use Topics  . Smoking status: Current Every Day Smoker -- 0.25 packs/day for 7 years    Types: Cigarettes  . Smokeless tobacco: Not on file  . Alcohol Use: 3.0 oz/week    5 Cans of beer per week     Comment: case beer daily    Review of Systems  Respiratory: Positive for chest tightness.   Gastrointestinal: Negative for vomiting and abdominal pain.  Psychiatric/Behavioral: Positive for suicidal ideas and hallucinations.  All other systems reviewed and are negative.    Allergies  Benadryl; Tylenol; and Vistaril  Home Medications   Current Outpatient Rx  Name  Route  Sig  Dispense  Refill  . ARIPiprazole (ABILIFY) 10 MG tablet   Oral   Take 1 tablet (10 mg total) by mouth 2 (two) times daily. For mood control.   60 tablet   0   . gabapentin (NEURONTIN) 600 MG tablet      Take 1 tablet (600 mg) Four times daily:For substance withdrawal related anxiety.   120 tablet   0   . ibuprofen (ADVIL,MOTRIN) 800 MG tablet   Oral   Take 1 tablet (800 mg total) by mouth every 6 (six) hours as needed for moderate pain.   120 tablet   0   . levothyroxine (SYNTHROID, LEVOTHROID) 100 MCG tablet   Oral   Take 1 tablet (100 mcg total) by mouth every morning. For thyroid disease.  30 tablet   0   . pantoprazole (PROTONIX) 40 MG tablet   Oral   Take 1 tablet (40 mg total) by mouth daily. For acid reflux   30 tablet   0   . PARoxetine (PAXIL) 40 MG tablet   Oral   Take 1 tablet (40 mg total) by mouth daily.   30 tablet   0   . traZODone (DESYREL) 50 MG tablet   Oral   Take 1 tablet (50 mg total) by mouth at bedtime as needed and may repeat dose one time if needed for sleep.   60 tablet   0    Triage Vitals: BP 131/80  Pulse 69  Resp 16  SpO2 97%  Physical Exam  Constitutional: He is oriented to person, place, and time. He appears well-developed and well-nourished. No  distress.  HENT:  Head: Normocephalic and atraumatic.  Right Ear: External ear normal.  Left Ear: External ear normal.  Nose: Nose normal.  Mouth/Throat: Oropharynx is clear and moist. No oropharyngeal exudate.  Eyes: Conjunctivae are normal.  Neck: Normal range of motion. Neck supple.  Cardiovascular: Normal rate, regular rhythm, normal heart sounds and intact distal pulses.   Pulmonary/Chest: Effort normal and breath sounds normal. No respiratory distress. He has no wheezes. He has no rales. He exhibits no tenderness.  Abdominal: Soft. Bowel sounds are normal. There is no tenderness.  Musculoskeletal: Normal range of motion.  Neurological: He is alert and oriented to person, place, and time. GCS eye subscore is 4. GCS verbal subscore is 5. GCS motor subscore is 6.  Skin: Skin is warm and dry. He is not diaphoretic.  Psychiatric: He exhibits a depressed mood. He expresses suicidal ideation. He expresses no homicidal ideation. He expresses suicidal plans. He expresses no homicidal plans.  Will not disclose plan for suicide.     ED Course  Procedures (including critical care time) Medications  ibuprofen (ADVIL,MOTRIN) tablet 600 mg (not administered)  ondansetron (ZOFRAN) tablet 4 mg (not administered)  alum & mag hydroxide-simeth (MAALOX/MYLANTA) 200-200-20 MG/5ML suspension 30 mL (not administered)  LORazepam (ATIVAN) tablet 1 mg (not administered)  LORazepam (ATIVAN) tablet 0-4 mg (not administered)    Followed by  LORazepam (ATIVAN) tablet 0-4 mg (not administered)  thiamine (VITAMIN B-1) tablet 100 mg (not administered)    Or  thiamine (B-1) injection 100 mg (not administered)     DIAGNOSTIC STUDIES: Oxygen Saturation is 97% on room air, normal by my interpretation.    COORDINATION OF CARE: 11:39 PM- Will order blood labs and UA. Patient will consult to TTS. Discussed treatment plan with patient at bedside and patient verbalized agreement.    Labs Review Labs Reviewed   COMPREHENSIVE METABOLIC PANEL - Abnormal; Notable for the following:    Glucose, Bld 133 (*)    Total Protein 8.4 (*)    AST 199 (*)    ALT 260 (*)    Alkaline Phosphatase 121 (*)    All other components within normal limits  ETHANOL - Abnormal; Notable for the following:    Alcohol, Ethyl (B) 266 (*)    All other components within normal limits  URINE RAPID DRUG SCREEN (HOSP PERFORMED) - Abnormal; Notable for the following:    Tetrahydrocannabinol POSITIVE (*)    All other components within normal limits  CBC  TROPONIN I   Imaging Review Dg Chest 2 View  06/13/2013   CLINICAL DATA:  Central chest pain.  EXAM: CHEST  2 VIEW  COMPARISON:  DG  CHEST 2V dated 05/12/2013  FINDINGS: Cardiomediastinal silhouette is unremarkable. The lungs are clear without pleural effusions or focal consolidations. Mildly elevated right hemidiaphragm is stable in appearance. Trachea projects midline and there is no pneumothorax. Soft tissue planes and included osseous structures are non-suspicious. Multiple EKG lines overlie the patient and may obscure subtle underlying pathology.  IMPRESSION: No acute cardiopulmonary process.   Electronically Signed   By: Awilda Metro   On: 06/13/2013 00:59    EKG Interpretation   None       MDM   Final diagnoses:  Suicidal ideations  Depression   Filed Vitals:   06/13/13 0109  BP: 130/74  Pulse:   Temp: 98.9 F (37.2 C)  Resp: 20   I have reviewed nursing notes, vital signs, and all appropriate lab and imaging results for this patient.   Patient presenting for medical clearance for suicidal ideations and hallucinations. Patient is complaining of 2-3 weeks of diffuse chest tightness w/o any associated SOB. EKG unremarkable. CXR unremarkable. Troponin is negative, after 2-3 weeks of chest tightness would suspect elevated troponin at this time. Do not feel patient will require delta troponin d/t low risk. Heart Score is 1. Patient is low risk. Do not feel  this is cardiac or pulmonary etiology (does not meet any Well's Criteria for PE).   Pt presents to the ED for medical clearance.  Pt is currently having SI ideations. Pt has a plan, but will not disclose. The patients demeanor is dysphoric. Admits difficulty sleeping, loss of interests, feelings of worthlessness, lack of energy, difficulty concentrating, loss of appetite, feelings of anxiety.  The patient currently  is in no acute distress. The patients demeanor is depressed. The patient was brought to ED by self an is seeking voluntary assistance.   ACT consult was appreciated and pt was moved to Psych ED for further evaluation.   I have reviewed nursing notes, vital signs, and all appropriate lab and imaging results for this patient.   I personally performed the services described in this documentation, which was scribed in my presence. The recorded information has been reviewed and is accurate.      Jeannetta Ellis, PA-C 06/13/13 [phone]

## 2013-06-12 NOTE — ED Notes (Signed)
Pt brought here for medical clearance by [employer] voluntarily  Pt states he has depression and has been having audible hallucinations  Pt states thoughts of hurting himself are always there but not too prevalent  Pt states he is not going to do anything to himself

## 2013-06-13 ENCOUNTER — Encounter (HOSPITAL_COMMUNITY): Payer: Self-pay | Admitting: *Deleted

## 2013-06-13 ENCOUNTER — Emergency Department (HOSPITAL_COMMUNITY): Payer: Medicaid Other

## 2013-06-13 ENCOUNTER — Inpatient Hospital Stay (HOSPITAL_COMMUNITY)
Admission: AD | Admit: 2013-06-13 | Discharge: 2013-06-17 | DRG: 885 | Disposition: A | Discharge status: Home / Self Care | Payer: Medicaid Other | Source: Intra-hospital | Attending: Psychiatry | Admitting: Psychiatry

## 2013-06-13 DIAGNOSIS — R45851 Suicidal ideations: Secondary | ICD-10-CM

## 2013-06-13 DIAGNOSIS — F1010 Alcohol abuse, uncomplicated: Secondary | ICD-10-CM

## 2013-06-13 DIAGNOSIS — F1020 Alcohol dependence, uncomplicated: Secondary | ICD-10-CM

## 2013-06-13 DIAGNOSIS — F4310 Post-traumatic stress disorder, unspecified: Secondary | ICD-10-CM | POA: Diagnosis present

## 2013-06-13 DIAGNOSIS — K219 Gastro-esophageal reflux disease without esophagitis: Secondary | ICD-10-CM | POA: Diagnosis present

## 2013-06-13 DIAGNOSIS — F17200 Nicotine dependence, unspecified, uncomplicated: Secondary | ICD-10-CM | POA: Diagnosis present

## 2013-06-13 DIAGNOSIS — F10988 Alcohol use, unspecified with other alcohol-induced disorder: Secondary | ICD-10-CM | POA: Diagnosis present

## 2013-06-13 DIAGNOSIS — F1120 Opioid dependence, uncomplicated: Secondary | ICD-10-CM | POA: Diagnosis present

## 2013-06-13 DIAGNOSIS — F329 Major depressive disorder, single episode, unspecified: Principal | ICD-10-CM | POA: Diagnosis present

## 2013-06-13 DIAGNOSIS — F1210 Cannabis abuse, uncomplicated: Secondary | ICD-10-CM | POA: Diagnosis present

## 2013-06-13 DIAGNOSIS — E039 Hypothyroidism, unspecified: Secondary | ICD-10-CM | POA: Diagnosis present

## 2013-06-13 DIAGNOSIS — F1420 Cocaine dependence, uncomplicated: Secondary | ICD-10-CM | POA: Diagnosis present

## 2013-06-13 DIAGNOSIS — Z87820 Personal history of traumatic brain injury: Secondary | ICD-10-CM

## 2013-06-13 DIAGNOSIS — F1410 Cocaine abuse, uncomplicated: Secondary | ICD-10-CM

## 2013-06-13 DIAGNOSIS — F3289 Other specified depressive episodes: Secondary | ICD-10-CM

## 2013-06-13 LAB — CBC
HCT: 42.8 % (ref 39.0–52.0)
HEMOGLOBIN: 14.2 g/dL (ref 13.0–17.0)
MCH: 29.5 pg (ref 26.0–34.0)
MCHC: 33.2 g/dL (ref 30.0–36.0)
MCV: 88.8 fL (ref 78.0–100.0)
Platelets: 229 10*3/uL (ref 150–400)
RBC: 4.82 MIL/uL (ref 4.22–5.81)
RDW: 13.9 % (ref 11.5–15.5)
WBC: 9.4 10*3/uL (ref 4.0–10.5)

## 2013-06-13 LAB — COMPREHENSIVE METABOLIC PANEL
ALK PHOS: 121 U/L — AB (ref 39–117)
ALT: 260 U/L — ABNORMAL HIGH (ref 0–53)
AST: 199 U/L — ABNORMAL HIGH (ref 0–37)
Albumin: 3.9 g/dL (ref 3.5–5.2)
BUN: 10 mg/dL (ref 6–23)
CO2: 25 meq/L (ref 19–32)
Calcium: 9 mg/dL (ref 8.4–10.5)
Chloride: 102 mEq/L (ref 96–112)
Creatinine, Ser: 0.64 mg/dL (ref 0.50–1.35)
GFR calc non Af Amer: >90 mL/min (ref >90)
GLUCOSE: 133 mg/dL — AB (ref 70–99)
POTASSIUM: 4.4 meq/L (ref 3.7–5.3)
SODIUM: 140 meq/L (ref 137–147)
TOTAL PROTEIN: 8.4 g/dL — AB (ref 6.0–8.3)
Total Bilirubin: 0.3 mg/dL (ref 0.3–1.2)

## 2013-06-13 LAB — ETHANOL: Alcohol, Ethyl (B): 266 mg/dL — ABNORMAL HIGH (ref 0–11)

## 2013-06-13 LAB — TROPONIN I: Troponin I: <0.3 ng/mL (ref <0.30)

## 2013-06-13 MED ORDER — THIAMINE HCL 100 MG/ML IJ SOLN
100.0000 mg | Freq: Every day | INTRAMUSCULAR | Status: DC
  Administered 2013-06-13: 100 mg via INTRAVENOUS
  Filled 2013-06-13: qty 2

## 2013-06-13 MED ORDER — TRAZODONE HCL 50 MG PO TABS: 50.0000 mg | ORAL_TABLET | Freq: Every evening | ORAL | Status: DC | PRN

## 2013-06-13 MED ORDER — IBUPROFEN 200 MG PO TABS
600.0000 mg | ORAL_TABLET | Freq: Three times a day (TID) | ORAL | Status: DC | PRN
  Administered 2013-06-13: 600 mg via ORAL
  Filled 2013-06-13: qty 3

## 2013-06-13 MED ORDER — LORAZEPAM 1 MG PO TABS: 0.0000 mg | ORAL_TABLET | Freq: Two times a day (BID) | ORAL | Status: DC

## 2013-06-13 MED ORDER — VITAMIN B-1 100 MG PO TABS
100.0000 mg | ORAL_TABLET | Freq: Every day | ORAL | Status: DC
Start: 2013-06-14 — End: 2013-06-17
  Administered 2013-06-14 – 2013-06-17 (×4): 100 mg via ORAL
  Filled 2013-06-13 (×6): qty 1

## 2013-06-13 MED ORDER — IBUPROFEN 600 MG PO TABS
600.0000 mg | ORAL_TABLET | Freq: Three times a day (TID) | ORAL | Status: DC | PRN
  Administered 2013-06-14 (×2): 600 mg via ORAL
  Filled 2013-06-13 (×2): qty 1

## 2013-06-13 MED ORDER — ALUM & MAG HYDROXIDE-SIMETH 200-200-20 MG/5ML PO SUSP: 30.0000 mL | ORAL | Status: DC | PRN

## 2013-06-13 MED ORDER — LORAZEPAM 1 MG PO TABS: 1.0000 mg | ORAL_TABLET | Freq: Three times a day (TID) | ORAL | Status: DC | PRN

## 2013-06-13 MED ORDER — ONDANSETRON HCL 4 MG PO TABS: 4.0000 mg | ORAL_TABLET | Freq: Three times a day (TID) | ORAL | Status: DC | PRN

## 2013-06-13 MED ORDER — LORAZEPAM 1 MG PO TABS
0.0000 mg | ORAL_TABLET | Freq: Two times a day (BID) | ORAL | Status: AC
  Administered 2013-06-16: 1 mg via ORAL
  Administered 2013-06-16: 2 mg via ORAL
  Administered 2013-06-16: 4 mg via ORAL
  Administered 2013-06-17: 1 mg via ORAL
  Filled 2013-06-13: qty 2
  Filled 2013-06-13: qty 1
  Filled 2013-06-13: qty 4

## 2013-06-13 MED ORDER — VITAMIN B-1 100 MG PO TABS: 100.0000 mg | ORAL_TABLET | Freq: Every day | ORAL | Status: DC

## 2013-06-13 MED ORDER — THIAMINE HCL 100 MG/ML IJ SOLN: 100.0000 mg | Freq: Every day | INTRAMUSCULAR | Status: DC

## 2013-06-13 MED ORDER — MAGNESIUM HYDROXIDE 400 MG/5ML PO SUSP: 30.0000 mL | Freq: Every day | ORAL | Status: DC | PRN

## 2013-06-13 MED ORDER — LEVOTHYROXINE SODIUM 100 MCG PO TABS
100.0000 ug | ORAL_TABLET | Freq: Every morning | ORAL | Status: DC
  Administered 2013-06-14 – 2013-06-17 (×4): 100 ug via ORAL
  Filled 2013-06-13: qty 14
  Filled 2013-06-13 (×5): qty 1

## 2013-06-13 MED ORDER — LORAZEPAM 1 MG PO TABS
1.0000 mg | ORAL_TABLET | Freq: Three times a day (TID) | ORAL | Status: DC | PRN
  Administered 2013-06-14 – 2013-06-16 (×4): 1 mg via ORAL
  Filled 2013-06-13 (×3): qty 1

## 2013-06-13 MED ORDER — LORAZEPAM 1 MG PO TABS: 0.0000 mg | ORAL_TABLET | Freq: Four times a day (QID) | ORAL | Status: DC

## 2013-06-13 MED ORDER — PANTOPRAZOLE SODIUM 40 MG PO TBEC
40.0000 mg | DELAYED_RELEASE_TABLET | Freq: Every day | ORAL | Status: DC
  Administered 2013-06-14 – 2013-06-17 (×4): 40 mg via ORAL
  Filled 2013-06-13: qty 1
  Filled 2013-06-13: qty 14
  Filled 2013-06-13 (×4): qty 1

## 2013-06-13 MED ORDER — LORAZEPAM 1 MG PO TABS
0.0000 mg | ORAL_TABLET | Freq: Four times a day (QID) | ORAL | Status: AC
  Administered 2013-06-14: 1 mg via ORAL
  Administered 2013-06-14: 2 mg via ORAL
  Administered 2013-06-15: 1 mg via ORAL
  Filled 2013-06-13: qty 2
  Filled 2013-06-13: qty 1
  Filled 2013-06-13: qty 3
  Filled 2013-06-13: qty 2
  Filled 2013-06-13 (×2): qty 1

## 2013-06-13 NOTE — Progress Notes (Signed)
   CARE MANAGEMENT ED NOTE 06/13/2013  Patient:  Edward [name],Edward [name]   Account Number:  [id number]  Date Initiated:  06/13/2013  Documentation initiated by:  Radford PaxFERRERO,Laylani Pudwill  Subjective/Objective Assessment:   Patient presents to ED with depression and auditory hallucinations     Subjective/Objective Assessment Detail:   Patient with pmhx of PTSD anxiety and depression     Action/Plan:   Action/Plan Detail:   Anticipated DC Date:       Status Recommendation to Physician:   Result of Recommendation:    Other ED Services  Consult Working Plan    DC Planning Services  Other  PCP issues    Choice offered to / List presented to:            Status of service:  Completed, signed off  ED Comments:   ED Comments Detail:  EDCM spoke to patient at bedside.  Patient confirms he does not have a pcp and does have Medicaid insurance.  EDCM nprovided patient with a list of pcps who accept [place] in [place].  Patient thankful for resources.

## 2013-06-13 NOTE — ED Provider Notes (Signed)
Medical screening examination/treatment/procedure(s) were performed by non-physician practitioner and as supervising physician I was immediately available for consultation/collaboration.  EKG Interpretation   None        Hodaya Curto K Shirline Kendle-Rasch, MD 06/13/13 [phone]

## 2013-06-13 NOTE — ED Notes (Signed)
Bel[facility] pt [facility]

## 2013-06-13 NOTE — ED Notes (Signed)
Patient transported to X-ray 

## 2013-06-13 NOTE — ED Notes (Signed)
Called [facility] transport

## 2013-06-13 NOTE — ED Notes (Signed)
Telepsych in progress. 

## 2013-06-13 NOTE — Tx Team (Signed)
Initial Interdisciplinary Treatment Plan  PATIENT STRENGTHS: (choose at least two) Ability for insight Average or above average intelligence Capable of independent living General fund of knowledge  PATIENT STRESSORS: Health problems Substance abuse   PROBLEM LIST: Problem List/Patient Goals Date to be addressed Date deferred Reason deferred Estimated date of resolution  Depression 06/13/13     Suicidal Ideation 06/13/13     Substance Abuse 06/13/13                                          DISCHARGE CRITERIA:  Ability to meet basic life and health needs Improved stabilization in mood, thinking, and/or behavior Verbal commitment to aftercare and medication compliance Withdrawal symptoms are absent or subacute and managed without 24-hour nursing intervention  PRELIMINARY DISCHARGE PLAN: Attend aftercare/continuing care group Placement in alternative living arrangements  PATIENT/FAMIILY INVOLVEMENT: This treatment plan has been presented to and reviewed with the patient, Edward [name], and/or family member, .  The patient and family have been given the opportunity to ask questions and make suggestions.  Edward [name], [name] 06/13/2013, 8:05 PM

## 2013-06-13 NOTE — Consult Note (Signed)
  Edward [name] is a 53 years old [place] diagnosed with PTSD and Major depressive disorder. Admits to drinking alcohol sporadically and used marijuana last night. Depressed and having suicidal toughts with no plan. Non compliant with his PAxil.  A: MDD, PTSD Alcohol use disorder Plan: Admit to Inpatient unit 500 hall bed. Continue alcohol detox.

## 2013-06-13 NOTE — BH Assessment (Addendum)
Assessment Note  Edward [name] is a 53 y.o. male presenting voluntarily to [facility] with SI/Depression/PTSD/SA/AVH.  Pt denies HI.  Pt is intoxicated but is able to engage in the interview. Pt reports the following: pt has on-going SI thoughts but no current plan to harm self.  He states that he has 2 past SI attempts both by overdose in 2012 and 2001.  Pt reports stressors attributed to current mental status:(1) possibly returning to [place] to help his sister, she is sick--"I don't know why anybody would need me"; (2) homelessness;(3) financial;(4) maintaining sobriety.  Pt admits the he relapsed 24 hrs ago, consuming "5 good pulls off bottle of vodka".  Pt BAL is 266 and also using 1/2 of a marijuana blunt.  Pt says he's been sober for 2 wks and detoxed on his own.    Pt is hearing voices telling him he's "no good", "you would be better off dead' and "GOD hates me" and states he's been voices for approx 2wks and only hears voices when he's stressed.  Pt is dx with PTSD, combat related and says he served in the [employer] for [phone].  Pt endorses depressive sxs: anhedonia, isolation, no interest in regular activities and increased sleep(12hrs daily).  Pt told this pt that he was living in a shelter, is receiving a disability check and [place] from from [place]rs ago because it's cheaper to live here on disability.  When this writer asked pt if he felt safe being d/c'd, he replied "I don't know".  Pt cannot reliably contract for safety.       Axis I: Major Depression, Recurrent severe and Post Traumatic Stress Disorder Axis II: Deferred Axis III:  Past Medical History  Diagnosis Date  . Thyroid disease   . Hypothyroidism   . Mental disorder   . Depression   . Anxiety and depression   . Major depression   . PTSD (post-traumatic stress disorder)   . Hepatitis     hep C  . Meniscus tear 02/28/2013    Right side  . Neuromuscular disorder     pinched nerve (on 02/28/13 pt denies having  this problem)   Axis IV: economic problems, housing problems, occupational problems, other psychosocial or environmental problems, problems related to social environment, problems with access to health care services and problems with primary support group Axis V: 21-30 behavior considerably influenced by delusions or hallucinations OR serious impairment in judgment, communication OR inability to function in almost all areas  Past Medical History:  Past Medical History  Diagnosis Date  . Thyroid disease   . Hypothyroidism   . Mental disorder   . Depression   . Anxiety and depression   . Major depression   . PTSD (post-traumatic stress disorder)   . Hepatitis     hep C  . Meniscus tear 02/28/2013    Right side  . Neuromuscular disorder     pinched nerve (on 02/28/13 pt denies having this problem)    Past Surgical History  Procedure Laterality Date  . No past surgeries    . Testiclar cyst excision      Family History: History reviewed. No pertinent family history.  Social History:  reports that he has been smoking Cigarettes.  He has a 1.75 pack-year smoking history. He does not have any smokeless tobacco history on file. He reports that he drinks about 3.0 ounces of alcohol per week. He reports that he uses illicit drugs ("Crack" cocaine, Marijuana, and  Cocaine).  Additional Social History:  Alcohol / Drug Use Pain Medications: See MAR  Prescriptions: See MAR Over the Counter: See MAR  History of alcohol / drug use?: Yes Longest period of sobriety (when/how long): Sobriety x2wks  Negative Consequences of Use: Work / School;Personal relationships;Financial Withdrawal Symptoms: Other (Comment) (No current w/d sxs ) Substance #1 Name of Substance 1: Alcohol--Vodka  1 - Age of First Use: 12 YOM  1 - Amount (size/oz): "5 good pulls off a bottle of vodka" 1 - Frequency: Daily  1 - Duration: 1 Day 1 - Last Use / Amount: 06/12/13 Substance #2 Name of Substance 2: THC  2 - Age of  First Use: 12 YOM  2 - Amount (size/oz): "1/2 blunt" 2 - Frequency: Daily  2 - Duration: 1 Day  2 - Last Use / Amount: 06/12/13  CIWA: CIWA-Ar BP: 116/78 mmHg Pulse Rate: 71 Nausea and Vomiting: no nausea and no vomiting Tactile Disturbances: none Tremor: no tremor Auditory Disturbances: not present Paroxysmal Sweats: no sweat visible Visual Disturbances: not present Anxiety: no anxiety, at ease Headache, Fullness in Head: none present Agitation: normal activity Orientation and Clouding of Sensorium: oriented and can do serial additions CIWA-Ar Total: 0 COWS:    Allergies:  Allergies  Allergen Reactions  . Benadryl [Diphenhydramine]     Restless leg syndrome  . Tylenol [Acetaminophen]     "I don't take tylenol because I have hepatitis c."  . Vistaril [Hydroxyzine Hcl]     Restless leg syndrome    Home Medications:  (Not in a hospital admission)  OB/GYN Status:  No LMP for male patient.  General Assessment Data [facility] of Assessment: [facility] Is this a Tele or Face-to-Face Assessment?: Tele Assessment Is this an Initial Assessment or a Re-assessment for this encounter?: Initial Assessment Living Arrangements: Other (Comment) (Homeless ) Can pt return to current living arrangement?: Yes Admission Status: Voluntary Is patient capable of signing voluntary admission?: Yes Transfer from: Acute Hospital Referral Source: MD  Medical Screening Exam [facility] Walk-in ONLY) Medical Exam completed: No Reason for MSE not completed: Other: (None )  [facility] Crisis Care Plan Living Arrangements: Other (Comment) (Homeless ) Name of Psychiatrist: None  Name of Therapist: None   Education Status Is patient currently in school?: No Current Grade: None  Highest grade of school patient has completed: None  Name of school: None  Contact person: None   Risk to self Suicidal Ideation: Yes-Currently Present Suicidal Intent: No-Not Currently/Within Last 6 Months Is patient at risk for  suicide?: Yes Suicidal Plan?: No-Not Currently/Within Last 6 Months Access to Means: Yes Specify Access to Suicidal Means: Medications  What has been your use of drugs/alcohol within the last 12 months?: Abusing: alcohol, thc  Previous Attempts/Gestures: Yes How many times?: 2 Other Self Harm Risks: None  Triggers for Past Attempts: Unpredictable Intentional Self Injurious Behavior: None Family Suicide History: No Recent stressful life event(s): Financial Problems;Other (Comment) (Homeless, maintaining sobriety, possible return to [place]) Persecutory voices/beliefs?: Yes Depression: Yes Depression Symptoms: Loss of interest in usual pleasures;Feeling worthless/self pity;Despondent;Isolating Substance abuse history and/or treatment for substance abuse?: Yes Suicide prevention information given to non-admitted patients: Not applicable  Risk to Others Homicidal Ideation: No Thoughts of Harm to Others: No Current Homicidal Intent: No Current Homicidal Plan: No Access to Homicidal Means: No Identified Victim: None  History of harm to others?: No Assessment of Violence: None Noted Violent Behavior Description: None  Does patient have access to weapons?: No Criminal Charges  Pending?: No Does patient have a court date: No  Psychosis Hallucinations: Auditory Delusions: None noted  Mental Status Report Appear/Hygiene: Disheveled;Poor hygiene Eye Contact: Fair Motor Activity: Unremarkable Speech: Logical/coherent;Slurred Level of Consciousness: Alert Mood: Depressed Affect: Depressed Anxiety Level: None Thought Processes: Relevant;Coherent Judgement: Impaired Orientation: Person;Place;Time;Situation Obsessive Compulsive Thoughts/Behaviors: None  Cognitive Functioning Concentration: Normal Memory: Recent Intact;Remote Intact IQ: Average Insight: Poor Impulse Control: Fair Appetite: Good Weight Loss: 0 Weight Gain: 0 Sleep: Increased Total Hours of Sleep:  12 Vegetative Symptoms: None  ADLScreening [facility] Assessment Services) Patient's cognitive ability adequate to safely complete daily activities?: Yes Patient able to express need for assistance with ADLs?: Yes Independently performs ADLs?: Yes (appropriate for developmental age)  Prior Inpatient Therapy Prior Inpatient Therapy: Yes Prior Therapy Dates: 2014,2013 Prior Therapy [facility] and other hospitals in [place]  Reason for Treatment: PTSD, Depression   Prior Outpatient Therapy Prior Outpatient Therapy: No Prior Therapy Dates: None  Prior Therapy Facilty/Provider(s): None  Reason for Treatment: None   ADL Screening (condition at time of admission) Patient's cognitive ability adequate to safely complete daily activities?: Yes Is the patient deaf or have difficulty hearing?: No Does the patient have difficulty seeing, even when wearing glasses/contacts?: No Does the patient have difficulty concentrating, remembering, or making decisions?: No Patient able to express need for assistance with ADLs?: Yes Does the patient have difficulty dressing or bathing?: No Independently performs ADLs?: Yes (appropriate for developmental age) Does the patient have difficulty walking or climbing stairs?: No Weakness of Legs: None Weakness of Arms/Hands: None  Home Assistive Devices/Equipment Home Assistive Devices/Equipment: None  Therapy Consults (therapy consults require a physician order) PT Evaluation Needed: No OT Evalulation Needed: No SLP Evaluation Needed: No Abuse/Neglect Assessment (Assessment to be complete while patient is alone) Physical Abuse: Denies Verbal Abuse: Denies Sexual Abuse: Denies Exploitation of patient/patient's resources: Denies Self-Neglect: Denies Values / Beliefs Cultural Requests During Hospitalization: None Spiritual Requests During Hospitalization: None Consults Spiritual Care Consult Needed: No Social Work Consult Needed:  No Merchant navy officerAdvance Directives (For Healthcare) Advance Directive: Patient does not have advance directive;Patient would not like information Pre-existing out of facility DNR order (yellow form or pink MOST form): No Nutrition Screen- [facility] Adult/[facility] Patient's home diet: Regular  Additional Information 1:1 In Past 12 Months?: No CIRT Risk: No Elopement Risk: No Does patient have medical clearance?: Yes     Disposition:  Disposition Initial Assessment Completed for this Encounter: Yes Disposition of Patient: Inpatient treatment program;Referred to [facility] for [facility]pt admission ) Type of inpatient treatment program: Adult Patient referred to: Other (Comment) [facility] for possible ionpt admission )  On Site Evaluation by:   Reviewed with Physician:    Murrell ReddenSimmons, Raza Bayless C 06/13/2013 6:34 AM

## 2013-06-13 NOTE — Progress Notes (Signed)
53 year old male pt admitted on voluntary basis. On admission, pt did endorse some passive SI but able to contract for safety on the unit. Pt reports that he is homeless and spoke about possibly going to [place] when he discharges from here. Pt reports that he only drank the one night and reports that he last had anything a couple weeks ago. Pt does endorse auditory hallucinations but said they're not too bad on admission. Pt states that he has been taking medications as prescribed and has a support on right knee on admission. Pt was oriented to the unit and safety maintained.

## 2013-06-13 NOTE — Progress Notes (Signed)
D.  Pt pleasant on approach, denies complaints at this time.  Pt denied need for either Ativan or Trazodone at this time.  Pt states it had been some time since he had taken Trazodone and did not feel that he would need it.  Pt did not feel well enough to attend evening AA group, first night here.  Passive SI at this time time but does contract for safety.  A.  Support and encouragement offered  R.  Pt remains safe at this time, will continue to monitor.

## 2013-06-14 DIAGNOSIS — F1410 Cocaine abuse, uncomplicated: Secondary | ICD-10-CM

## 2013-06-14 DIAGNOSIS — F4310 Post-traumatic stress disorder, unspecified: Secondary | ICD-10-CM

## 2013-06-14 DIAGNOSIS — F1020 Alcohol dependence, uncomplicated: Secondary | ICD-10-CM

## 2013-06-14 DIAGNOSIS — F1994 Other psychoactive substance use, unspecified with psychoactive substance-induced mood disorder: Secondary | ICD-10-CM

## 2013-06-14 DIAGNOSIS — F329 Major depressive disorder, single episode, unspecified: Principal | ICD-10-CM

## 2013-06-14 MED ORDER — GABAPENTIN 300 MG PO CAPS
600.0000 mg | ORAL_CAPSULE | Freq: Three times a day (TID) | ORAL | Status: DC
  Administered 2013-06-14 – 2013-06-17 (×9): 600 mg via ORAL
  Filled 2013-06-14 (×15): qty 2

## 2013-06-14 MED ORDER — PAROXETINE HCL 20 MG PO TABS
40.0000 mg | ORAL_TABLET | Freq: Every day | ORAL | Status: DC
  Administered 2013-06-14 – 2013-06-17 (×4): 40 mg via ORAL
  Filled 2013-06-14: qty 28
  Filled 2013-06-14 (×7): qty 2

## 2013-06-14 MED ORDER — IBUPROFEN 800 MG PO TABS
800.0000 mg | ORAL_TABLET | Freq: Four times a day (QID) | ORAL | Status: DC | PRN
  Administered 2013-06-14 – 2013-06-17 (×8): 800 mg via ORAL
  Filled 2013-06-14 (×8): qty 1

## 2013-06-14 NOTE — Progress Notes (Signed)
D.  Pt pleasant on approach, states that earlier ativan really knocked him out.  Pt was awakened for snacks and stated then he thought it was the middle of the night.  Requested something for sleep because he was not sure he would be able to fall back asleep.  Pt did not attend evening AA group, remained in room sleeping.  Interacting appropriately with peers on unit, denies SI/HI/hallucinations at this time.  A.  Support and encouragement offered  R.  Pt remains safe on unit, will continue to monitor.

## 2013-06-14 NOTE — [facility] Group Notes (Signed)
[facility] Group Notes:  (Clinical Social Work)  06/14/2013     10-11AM  Summary of Progress/Problems:   The main focus of today's process group was for the patient to identify ways in which they have in the past sabotaged their own recovery. Motivational Interviewing was utilized to help patients explore the perceived benefits and costs of their substance use.  The use of PLANNED coping skills was discussed in depth as a means to address each of the dual diagnoses the patient is experiencing.  The patient expressed that if the use of substances were stopped, he would expect to feel depressed and angry, especially self-anger.  He talked about having enjoyed woodworking in the past, but said he just is not interested any longer, as his depressive symptoms have grown.  However, he also talked about sitting in his workshop listening to music and looking at his wood, just as a means of preventing himself from drinking and drugging.  We discussed how to turn this into a series of explicit goals to help with both his addiction and his depression.  He was interested and receptive.  Type of Therapy:  Group Therapy - Process   Participation Level:  Active  Participation Quality:  Attentive, Sharing and Supportive  Affect:  Blunted and Depressed  Cognitive:  Alert, Appropriate and Oriented  Insight:  Developing/Improving  Engagement in Therapy:  Developing/Improving  Modes of Intervention:  Education, Support and Processing, Motivational Interviewing  Ambrose MantleMareida Grossman-Orr, LCSW 06/14/2013, 11:58 AM

## 2013-06-14 NOTE — [facility] Suicide Risk Assessment (Signed)
Suicide Risk Assessment  Admission Assessment     Nursing information obtained from:    Demographic factors:    Current Mental Status:    Loss Factors:    Historical Factors:    Risk Reduction Factors:    Total Time spent with patient: 45 minutes  CLINICAL FACTORS:   Depression:   Comorbid alcohol abuse/dependence Alcohol/Substance Abuse/Dependencies Chronic Pain  COGNITIVE FEATURES THAT CONTRIBUTE TO RISK:  Closed-mindedness Polarized thinking Thought constriction (tunnel vision)    SUICIDE RISK:   Moderate:  Frequent suicidal ideation with limited intensity, and duration, some specificity in terms of plans, no associated intent, good self-control, limited dysphoria/symptomatology, some risk factors present, and identifiable protective factors, including available and accessible social support.  PLAN OF CARE: Supportive approach/coping skills/relapse prevention                              Detox as needed                              Optimize treatment with psychotropics  I certify that inpatient services furnished can reasonably be expected to improve the patient's condition.  Seerat Peaden A 06/14/2013, 1:52 PM

## 2013-06-14 NOTE — Progress Notes (Signed)
Nursing Progress Note : 7-7 p D:  Per pt self inventory pt reports having difficulty sleeping , appetite is fair, energy level is poor, rates depression at a 10/10 , rates hopelessness at a 10/10, Goal for today is be able to cope. Verbally contracted for safety. Pt isolating in room encouraged to participate in groups  A:  Support and encouragement provided, encouraged pt to attend all groups and activities, q15 minute checks continued for safety. C/o Chronic Right knee pain. Motrin increase.Refused ativan this afternoon c/o feeling sedated from previous ativan.  R- Will continue to monitor on q 15 minute checks for safety, compliant with medications and programing

## 2013-06-14 NOTE — Progress Notes (Signed)
Patient did not attend the evening speaker AA meeting. Pt remained in bed during group time.   

## 2013-06-14 NOTE — [facility] Counselor (Signed)
Adult Psychosocial Assessment Update Interdisciplinary Team  Previous [facility] admissions/discharges:  Admissions Discharges  Date:  08/23/12 Date:  08/29/12  Date:  03/01/13 Date:  03/07/13  Date:  03/27/13 Date:  04/02/13  Date: Date:  Date: Date:   Changes since the last Psychosocial Assessment (including adherence to outpatient mental health and/or substance abuse treatment, situational issues contributing to decompensation and/or relapse).  [facility]; completed  [facility] and then relapsed first of month on crack cocaine when his check came in using  All the funds. Consumed alcohol [facility] with high blood alcohol level.  States he has been "living here and there" Wanting to return to [place] when his check   Comes in 2/26. Patient reports he can stay with friends following discharge until available  Funds transfer to his account.    Discharge Plan 1. Will you be returning to the same living situation after discharge?   Yes: X with friends until he leaves for [place] No:      If no, what is your plan?           2. Would you like a referral for services when you are discharged? Yes: X     If yes, for what services? [facility]  No:              Summary and Recommendations (to be completed by the evaluator) Patient is 53 YO disabled caucasian male admitted following relapses on both crack   Cocaine (ealier in Jan and Feb) and alcohol (this week). Patient will benefit from crisis  medication evaluation, therapy groups for processing thoughts/feelings/experiences,   psycho ed groups for increasing coping skills, and aftercare planning                 Signature:  Edward [name], Edward [name], 06/14/2013 2:14 PM
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
## 2013-06-14 NOTE — [facility] Group Notes (Signed)
[facility] Group Notes:  (Nursing/MHT/Case Management/Adjunct)  Date:  06/14/2013  Time:  0930  Type of Therapy:  Nurse Education  Participation Level:  Minimal  Participation Quality:  Appropriate and Attentive  Affect:  Anxious, Appropriate and Depressed  Cognitive:  Alert and Appropriate  Insight:  Appropriate and Improving  Engagement in Group:  Developing/Improving and Improving  Modes of Intervention:  Education  Summary of Progress/Problems: Group focus on identifying healthy coping skills, pt says wood working helps him to relax when he's not drinking. 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  Jimmey Ralpherez, Kali Deadwyler M 06/14/2013, 11:05 AM

## 2013-06-15 MED ORDER — GABAPENTIN 300 MG PO CAPS
600.0000 mg | ORAL_CAPSULE | Freq: Every day | ORAL | Status: DC
  Administered 2013-06-15 – 2013-06-16 (×2): 600 mg via ORAL
  Filled 2013-06-15 (×4): qty 2

## 2013-06-15 MED ORDER — ENSURE COMPLETE PO LIQD
237.0000 mL | Freq: Two times a day (BID) | ORAL | Status: DC
  Administered 2013-06-15 – 2013-06-17 (×4): 237 mL via ORAL

## 2013-06-15 NOTE — Progress Notes (Signed)
Adult Psychoeducational Group Note  Date:  06/15/2013 Time:  3:15PM  Group Topic/Focus:  Healthy Support Systems  Participation Level:  Active  Participation Quality:  Sharing  Affect:  Appropriate  Cognitive:  Appropriate  Insight: Appropriate  Engagement in Group:  Engaged  Modes of Intervention:  Discussion  Additional Comments:  Pt volunteered first; he indicated that his sister Marcelino DusterMichelle was his biggest support and expressed that he could be a better support to himself by listening to her. Pt left after disclosing to the group.   Zacarias PontesSmith, Aras Albarran R 06/15/2013, 4:02 PM

## 2013-06-15 NOTE — [facility] Group Notes (Signed)
[facility] Group Notes: (Clinical Social Work)   06/15/2013      Type of Therapy:  Group Therapy   Participation Level:  Did Not Attend    Khaleelah Yowell Grossman-Orr, LCSW 06/15/2013, 12:05 PM     

## 2013-06-15 NOTE — Progress Notes (Signed)
D.  Pt. Has a flat affect, depressed mood.  Reports feelings of depression and hopelessness as a 10.  Denies SI/HI and Visual hallucinations but hears whispers saying bad things "God hates me."    Reports anxiety.  A.  Encouragement and support given.  Medications given as ordered. R.  Pt. Receptive and compliant with medications.

## 2013-06-15 NOTE — [facility] Group Notes (Signed)
[facility] Group Notes:  (Nursing/MHT/Case Management/Adjunct)  Date:  06/15/2013  Time:  1:47 PM  Type of Therapy:  Nurse Education  Participation Level:  Did Not Attend  Participation Quality:  Inattentive  Affect:  Depressed  Cognitive:  Lacking  Insight:  None  Engagement in Group:  None  Modes of Intervention:  Discussion  Summary of Progress/Problems: Pt did not attend the group on healthy support systems. Edward [name], Edward [name] 06/15/2013, 1:47 PM

## 2013-06-15 NOTE — Progress Notes (Signed)
The focus of this group is to educate the patient on the purpose and policies of crisis stabilization and provide a format to answer questions about their admission.  The group details unit policies and expectations of patients while admitted.  Patient attended 0900 nurse education orientation group this morning.  Patient actively participated, appropriate affect, alert, appropriate insight and engagement.  Today patient will work on 3 goals for discharge. Plans to return to [place] to be with his family.

## 2013-06-15 NOTE — Progress Notes (Addendum)
Nutrition Brief Note Patient identified on the Malnutrition Screening Tool (MST) Report.  Wt Readings from Last 10 Encounters:  06/13/13 171 lb (77.565 kg)  03/27/13 184 lb (83.462 kg)  03/24/13 185 lb 4.8 oz (84.052 kg)  03/09/13 180 lb (81.647 kg)  03/01/13 180 lb (81.647 kg)  08/23/12 184 lb (83.462 kg)  07/02/12 182 lb 8 oz (82.781 kg)  03/24/12 174 lb (78.926 kg)  11/27/11 172 lb (78.019 kg)   Body mass index is 26.01 kg/(m^2). Patient meets criteria for overweight based on current BMI.   Discussed intake PTA with patient and compared to intake presently.  Discussed changes in intake, if any, and encouraged adequate intake of meals and snacks. Current diet order is regular and pt is also offered choice of unit snacks mid-morning and mid-afternoon.  Pt is eating as desired.   Labs and medications reviewed.   Pt reports a 10 lb wt loss in the past week. He attributes this to "skipping too many meals." He says that he has been eating [facility].  Nutrition Dx:  Increased nutrient needs related to hyperthyroidism as evidenced by weight loss of 90 lbs.  Interventions:   Discussed the importance of nutrition and encouraged intake of food and beverages.     Discussed weight goals with patient.   Supplements: Ensure Complete po BID, each supplement provides 350 kcal and 13 grams of protein     If further nutritional issues arise, please consult RD.   Ebbie LatusHaley Hawkins RD, LDN

## 2013-06-15 NOTE — Progress Notes (Signed)
[facility] MD Progress Note  06/15/2013 5:10 PM Edward [name]  MRN:  [id number] Subjective:  Edward [name] states he did not sleep too well last night because of his roommates oxygen. He is still wanting to leave the area. States if he goes back to [place] he is going to have people there who need him. States he does better when he knows he is needed. Still complainig of pain and anxiety.  Diagnosis:   DSM5: Schizophrenia Disorders:  none Obsessive-Compulsive Disorders:  none Trauma-Stressor Disorders:  Posttraumatic Stress Disorder (309.81) Substance/Addictive Disorders:  Alcohol Related Disorder - Severe (303.90), Cocaine related disorder Depressive Disorders:  Major Depressive Disorder - Severe (296.23) Total Time spent with patient: 30 minutes  Axis I: Substance Induced Mood Disorder  ADL's:  Intact  Sleep: Poor  Appetite:  Fair  Suicidal Ideation:  Plan:  denies Intent:  denies Means:  denies Homicidal Ideation:  Plan:  denies Intent:  denies Means:  denies AEB (as evidenced by):  Psychiatric Specialty Exam: Physical Exam  Review of Systems  HENT: Negative.   Eyes: Negative.   Respiratory: Negative.   Cardiovascular: Negative.   Gastrointestinal: Negative.   Genitourinary: Negative.   Musculoskeletal: Positive for back pain and joint pain.  Skin: Negative.   Neurological: Positive for weakness.  Endo/Heme/Allergies: Negative.   Psychiatric/Behavioral: Positive for depression and substance abuse. The patient has insomnia.     Blood pressure 135/85, pulse 76, temperature 97.9 F (36.6 C), temperature source Oral, resp. rate 18, height 5\' 8"  (1.727 m), weight 77.565 kg (171 lb).Body mass index is 26.01 kg/(m^2).  General Appearance: Fairly Groomed  Patent attorneyye Contact::  Minimal  Speech:  Clear and Coherent and not spontaneous  Volume:  Decreased  Mood:  Depressed  Affect:  Restricted  Thought Process:  Coherent and Goal Directed  Orientation:  Full (Time, Place, and Person)   Thought Content:  symtpoms, worries, concerns  Suicidal Thoughts:  No  Homicidal Thoughts:  No  Memory:  Immediate;   Fair Recent;   Fair Remote;   Fair  Judgement:  Fair  Insight:  Shallow  Psychomotor Activity:  Psychomotor Retardation  Concentration:  Fair  Recall:  FiservFair  Fund of Knowledge:Fair  Language: Fair  Akathisia:  No  Handed:    AIMS (if indicated):     Assets:  Desire for Improvement  Sleep:  Number of Hours: 4.5   Musculoskeletal: Strength & Muscle Tone: within normal limits Gait & Station: normal Patient leans: N/A  Current Medications: Current Facility-Administered Medications  Medication Dose Route Frequency Provider Last Rate Last Dose  . alum & mag hydroxide-simeth (MAALOX/MYLANTA) 200-200-20 MG/5ML suspension 30 mL  30 mL Oral PRN Thresa RossNadeem Akhtar, MD      . feeding supplement (ENSURE COMPLETE) (ENSURE COMPLETE) liquid 237 mL  237 mL Oral BID BM Earna CoderHaley A Hawkins, RD   237 mL at 06/15/13 1654  . gabapentin (NEURONTIN) capsule 600 mg  600 mg Oral TID Rachael FeeIrving A Fotios Amos, MD   600 mg at 06/15/13 1655  . gabapentin (NEURONTIN) capsule 600 mg  600 mg Oral QHS Rachael FeeIrving A Blasa Raisch, MD      . ibuprofen (ADVIL,MOTRIN) tablet 800 mg  800 mg Oral Q6H PRN Rachael FeeIrving A Pepper Kerrick, MD   800 mg at 06/15/13 1657  . levothyroxine (SYNTHROID, LEVOTHROID) tablet 100 mcg  100 mcg Oral q morning - 10a Kristeen MansFran E Hobson, NP   100 mcg at 06/15/13 0818  . LORazepam (ATIVAN) tablet 0-4 mg  0-4 mg Oral 4 times  per day Thresa Ross, MD   2 mg at 06/14/13 2157   Followed by  . [START ON 06/16/2013] LORazepam (ATIVAN) tablet 0-4 mg  0-4 mg Oral Q12H Thresa Ross, MD      . LORazepam (ATIVAN) tablet 1 mg  1 mg Oral Q8H PRN Thresa Ross, MD   1 mg at 06/15/13 1350  . magnesium hydroxide (MILK OF MAGNESIA) suspension 30 mL  30 mL Oral Daily PRN Thresa Ross, MD      . pantoprazole (PROTONIX) EC tablet 40 mg  40 mg Oral Daily Kristeen Mans, NP   40 mg at 06/15/13 0819  . PARoxetine (PAXIL) tablet 40 mg  40 mg Oral  Daily Rachael Fee, MD   40 mg at 06/15/13 3664  . thiamine (VITAMIN B-1) tablet 100 mg  100 mg Oral Daily Thresa Ross, MD   100 mg at 06/15/13 0820   Or  . thiamine (B-1) injection 100 mg  100 mg Intravenous Daily Thresa Ross, MD        Lab Results: No results found for this or any previous visit (from the past 48 hour(s)).  Physical Findings: AIMS: Facial and Oral Movements Muscles of Facial Expression: None, normal Lips and Perioral Area: None, normal Jaw: None, normal Tongue: None, normal,Extremity Movements Upper (arms, wrists, hands, fingers): None, normal Lower (legs, knees, ankles, toes): None, normal, Trunk Movements Neck, shoulders, hips: None, normal, Overall Severity Severity of abnormal movements (highest score from questions above): None, normal Incapacitation due to abnormal movements: None, normal Patient's awareness of abnormal movements (rate only patient's report): No Awareness, Dental Status Current problems with teeth and/or dentures?: Yes Does patient usually wear dentures?: No  CIWA:  CIWA-Ar Total: 1 COWS:     Treatment Plan Summary: Daily contact with patient to assess and evaluate symptoms and progress in treatment Medication management  Plan: Supportive approach/coping skills/relapse prevention           Will increase the Neurontin to 600 mg TID and HS  Medical Decision Making Problem Points:  Review of psycho-social stressors (1) Data Points:  Review of medication regiment & side effects (2) Review of new medications or change in dosage (2)  I certify that inpatient services furnished can reasonably be expected to improve the patient's condition.   Bolton Canupp A 06/15/2013, 5:10 PM

## 2013-06-15 NOTE — Progress Notes (Signed)
D.  Pt pleasant on approach, states that the Ativan is working well for him for sleep but denies s/s of withdrawal at present.  Main complaint continues to be right knee pain.  Denies SI/HI/hallucinations at this time.  Interacting appropriately within milieu.  A.  Support and encouragement offered.  R. Pt remains safe on unit, will continue to monitor.

## 2013-06-15 NOTE — Progress Notes (Signed)
Patient did not attend the evening speaker AA meeting. Pt remained in the bed during group time.

## 2013-06-16 DIAGNOSIS — F411 Generalized anxiety disorder: Secondary | ICD-10-CM

## 2013-06-16 MED ORDER — TRAZODONE HCL 50 MG PO TABS
50.0000 mg | ORAL_TABLET | Freq: Every evening | ORAL | Status: DC | PRN
  Administered 2013-06-16 (×2): 50 mg via ORAL
  Filled 2013-06-16 (×8): qty 1

## 2013-06-16 NOTE — Tx Team (Signed)
Interdisciplinary Treatment Plan Update (Adult)  Date: 06/16/2013   Time Reviewed: 10:58 AM  Progress in Treatment:  Attending groups: No.  Participating in groups:  No.  Taking medication as prescribed: Yes  Tolerating medication: Yes  Family/Significant othe contact made: Not yet. SPE required for this pt.  Patient understands diagnosis: Yes, AEB seeking treatment for ETOH detox, cocaine abuse, mood stabilization, and passive SI.  Discussing patient identified problems/goals with staff: Yes  Medical problems stabilized or resolved: Yes  Denies suicidal/homicidal ideation: Yes during self report.  Patient has not harmed self or Others: Yes  New problem(s) identified:  Discharge Plan or Barriers: Pt not attending d/c planning group. CSW assessing for appropriate referrals.  Additional comments:52 Y/O male on one of multiple admissions to this hospital. He has historyy of alcohol and cocaine dependence. States he had been going from place to place from town to town from shelter to shelter until he cant stay there anymore and moves on to the next place. States he has an underlying depression that does not go away. Had some to drink and some crack. The depression got a little worst after he drank. He endorsed having SI, came to the ED. He has been on the Paxil up until the last few days. Thinking about going back to [place]. He is conflicted about this decision as being there has its own stress but he will be closer to family  Reason for Continuation of Hospitalization: Ativan taper-withdrawals Mood stabilization Medication management  Estimated length of stay: 3-5 days  For review of initial/current patient goals, please see plan of care.  Attendees:  Patient:    Family:    Physician: Geoffery LyonsIrving Lugo MD 06/16/2013 10:57 AM   Nursing:    Clinical Social Worker [employer] Sherwin-WilliamsHeather Smart, LCSWA  06/16/2013 10:57 AM   Other:    Other:    Other:    Other:    Scribe for Treatment Team:  Trula SladeHeather Smart LCSWA  06/16/2013 10:57 AM

## 2013-06-16 NOTE — [facility] Group Notes (Signed)
[facility] Note   06/16/2013 9:23 AM  Participation Quality: DID NOT ATTEND-pt in bed/refused to attend group.   Smart, American FinancialHeather LCSWA

## 2013-06-16 NOTE — Progress Notes (Signed)
Pt resting in bed with eyes closed. No distress noted. No c/o voiced.

## 2013-06-16 NOTE — Progress Notes (Signed)
[facility]  06/16/2013 5:45 PM Edward DunMyron [name]  MRN:  [id number] Subjective:  [name] is pretty reserved and guarded. He states he did not sleep too well. Still feels down. He is endorses decrease appetite  Does not go to the cafeteria as does not want to be around other people. "cant explain it."  States that he still thinks that leaving the are is the best ting he can do for himself Diagnosis:   DSM5: Schizophrenia Disorders:  none Obsessive-Compulsive Disorders:  none Trauma-Stressor Disorders:  Posttraumatic Stress Disorder (309.81) Substance/Addictive Disorders:  Alcohol Related Disorder - Severe (303.90) Depressive Disorders:  Major Depressive Disorder - Severe (296.23) Cocaine related disorder severe Total Time spent with patient: 20 minutes  Axis I: Generalized Anxiety Disorder and Substance Induced Mood Disorder  ADL's:  Intact  Sleep: Poor  Appetite:  Poor  Suicidal Ideation:  Plan:  denies Intent:  denies Means:  denies Homicidal Ideation:  Plan:  denies Intent:  denies Means:  denies AEB (as evidenced by):  Psychiatric Specialty Exam: Physical Exam  Review of Systems  Constitutional: Negative.   HENT: Negative.   Eyes: Negative.   Respiratory: Negative.   Cardiovascular: Negative.   Gastrointestinal: Negative.   Genitourinary: Negative.   Musculoskeletal: Negative.   Skin: Negative.   Neurological: Negative.   Endo/Heme/Allergies: Negative.   Psychiatric/Behavioral: Positive for depression and substance abuse. The patient is nervous/anxious and has insomnia.     Blood pressure 107/80, pulse 87, temperature 97.4 F (36.3 C), temperature source Oral, resp. rate 20, height 5\' 8"  (1.727 m), weight 77.565 kg (171 lb).Body mass index is 26.01 kg/(m^2).  General Appearance: Fairly Groomed  Patent attorneyye Contact::  Fair  Speech:  Clear and Coherent, Slow and not spontaneous  Volume:  Decreased  Mood:  Depressed and worried  Affect:  Restricted  Thought Process:   Coherent and Goal Directed  Orientation:  Full (Time, Place, and Person)  Thought Content:  no spontaneous content, answers questins, moslty reserved and guarded  Suicidal Thoughts:  No  Homicidal Thoughts:  No  Memory:  Immediate;   Fair Recent;   NA Remote;   NA  Judgement:  Fair  Insight:  Present  Psychomotor Activity:  Psychomotor Retardation  Concentration:  Fair  Recall:  Fair  Fund of Knowledge:NA  Language: Fair  Akathisia:  No  Handed:    AIMS (if indicated):     Assets:  Desire for Improvement  Sleep:  Number of Hours: 3.5   Musculoskeletal: Strength & Muscle Tone: within normal limits Gait & Station: normal Patient leans: N/A  Current Medications: Current Facility-Administered Medications  Medication Dose Route Frequency Provider Last Rate Last Dose  . alum & mag hydroxide-simeth (MAALOX/MYLANTA) 200-200-20 MG/5ML suspension 30 mL  30 mL Oral PRN Thresa RossNadeem Akhtar, MD      . feeding supplement (ENSURE COMPLETE) (ENSURE COMPLETE) liquid 237 mL  237 mL Oral BID BM Earna CoderHaley A Hawkins, RD   237 mL at 06/16/13 1258  . gabapentin (NEURONTIN) capsule 600 mg  600 mg Oral TID Rachael FeeIrving A Maiah Sinning, MD   600 mg at 06/16/13 1700  . gabapentin (NEURONTIN) capsule 600 mg  600 mg Oral QHS Rachael FeeIrving A Teddrick Mallari, MD   600 mg at 06/15/13 2132  . ibuprofen (ADVIL,MOTRIN) tablet 800 mg  800 mg Oral Q6H PRN Rachael FeeIrving A Henleigh Robello, MD   800 mg at 06/16/13 1300  . levothyroxine (SYNTHROID, LEVOTHROID) tablet 100 mcg  100 mcg Oral q morning - 10a Kristeen MansFran E Hobson, NP  100 mcg at 06/16/13 0834  . LORazepam (ATIVAN) tablet 0-4 mg  0-4 mg Oral Q12H Thresa Ross, MD   2 mg at 06/16/13 0841  . LORazepam (ATIVAN) tablet 1 mg  1 mg Oral Q8H PRN Thresa Ross, MD   1 mg at 06/15/13 2132  . magnesium hydroxide (MILK OF MAGNESIA) suspension 30 mL  30 mL Oral Daily PRN Thresa Ross, MD      . pantoprazole (PROTONIX) EC tablet 40 mg  40 mg Oral Daily Kristeen Mans, NP   40 mg at 06/16/13 0834  . PARoxetine (PAXIL) tablet 40 mg  40  mg Oral Daily Rachael Fee, MD   40 mg at 06/16/13 2956  . thiamine (VITAMIN B-1) tablet 100 mg  100 mg Oral Daily Thresa Ross, MD   100 mg at 06/16/13 2130   Or  . thiamine (B-1) injection 100 mg  100 mg Intravenous Daily Thresa Ross, MD        Lab Results: No results found for this or any previous visit (from the past 48 hour(s)).  Physical Findings: AIMS: Facial and Oral Movements Muscles of Facial Expression: None, normal Lips and Perioral Area: None, normal Jaw: None, normal Tongue: None, normal,Extremity Movements Upper (arms, wrists, hands, fingers): None, normal Lower (legs, knees, ankles, toes): None, normal, Trunk Movements Neck, shoulders, hips: None, normal, Overall Severity Severity of abnormal movements (highest score from questions above): None, normal Incapacitation due to abnormal movements: None, normal Patient's awareness of abnormal movements (rate only patient's report): No Awareness, Dental Status Current problems with teeth and/or dentures?: Yes Does patient usually wear dentures?: No  CIWA:  CIWA-Ar Total: 12 COWS:     Treatment Plan Summary: Daily contact with patient to assess and evaluate symptoms and progress in treatment Medication management  Plan: Supportive approach/coping skills/relapse prevention           Continue the detox           Reassess and address the co morbidities  Medical Decision Making Problem Points:  Review of psycho-social stressors (1) Data Points:  Review of medication regiment & side effects (2)  I certify that inpatient services furnished can reasonably be expected to improve the patient's condition.   Rachyl Wuebker A 06/16/2013, 5:45 PM

## 2013-06-16 NOTE — Progress Notes (Signed)
Adult Psychoeducational Group Note  Date:  06/16/2013 Time:  10:00 am  Group Topic/Focus:  Wellness Toolbox:   The focus of this group is to discuss various aspects of wellness, balancing those aspects and exploring ways to increase the ability to experience wellness.  Patients will create a wellness toolbox for use upon discharge.  Participation Level:  Did Not Attend   Esli Clements 06/16/2013, 3:39 PM 

## 2013-06-16 NOTE — [facility] Group Notes (Signed)
[facility] LCSW Group Therapy  06/16/2013 2:52 PM  Type of Therapy:  Group Therapy  Participation Level:  Did Not Attend-pt in room resting/refused to attend afternoon group.   Smart, Aeralyn Barna LCSWA  06/16/2013, 2:52 PM

## 2013-06-17 MED ORDER — GABAPENTIN 300 MG PO CAPS: 600.0000 mg | ORAL_CAPSULE | Freq: Four times a day (QID) | ORAL | Status: DC

## 2013-06-17 MED ORDER — PAROXETINE HCL 40 MG PO TABS: 40.0000 mg | ORAL_TABLET | Freq: Every day | ORAL | Status: DC

## 2013-06-17 MED ORDER — GABAPENTIN 300 MG PO CAPS: 600.0000 mg | ORAL_CAPSULE | Freq: Three times a day (TID) | ORAL | Status: DC

## 2013-06-17 MED ORDER — GABAPENTIN 600 MG PO TABS
600.0000 mg | ORAL_TABLET | Freq: Four times a day (QID) | ORAL | Status: DC
  Filled 2013-06-17: qty 56

## 2013-06-17 MED ORDER — TRAZODONE HCL 50 MG PO TABS
50.0000 mg | ORAL_TABLET | Freq: Every evening | ORAL | Status: DC | PRN
  Filled 2013-06-17: qty 14

## 2013-06-17 MED ORDER — TRAZODONE HCL 50 MG PO TABS: 50.0000 mg | ORAL_TABLET | Freq: Every evening | ORAL | Status: DC | PRN

## 2013-06-17 NOTE — Progress Notes (Signed)
[facility] :  Will you be returning to the same living situation after discharge: Yes,  back to [facility]/pt has bed and belongings there At discharge, do you have transportation home?:Yes,  pt chooses to walk/limited bus schedule today/bus pass provided Do you have the ability to pay for your medications:Yes,  [facility]  Release of information consent forms completed and Submitted to Medical Records by CSW.  Patient to Follow up at: Follow-up Information   Follow up with [facility]. (Walk in between 8am-9am Monday through Friday for hospital followup/medication management/assessment for therapy services. )    Contact information:   [address] [place], [place] [address] Phone: [phone] Fax: [phone]      Patient denies SI/HI:   Yes,  during self report.    Safety Planning and Suicide Prevention discussed:  Yes,  Pt refused to consent to family contact. SPE completed with pt. SPI pamphlet provided to pt and he was encouraged to share information with support network, ask questions, and talk about any concerns relating to SPE.  Smart, Michaela Broski LCSWA  06/17/2013, 11:24 AM

## 2013-06-17 NOTE — Progress Notes (Signed)
Patient ID: Edward [name], male   DOB: November 13, 1960, 53 y.o.   MRN: [id number] He has been discharged and said that he was going to a shelter tonight. He voiced understanding of discharge instruction and of the follow up plan. He denies thoughts of SI and all his belongings were taken with him.

## 2013-06-17 NOTE — [facility] Suicide Risk Assessment (Signed)
[facility] INPATIENT:  Family/Significant Other Suicide Prevention Education  Suicide Prevention Education:  Patient Refusal for Family/Significant Other Suicide Prevention Education: The patient Edward [name] has refused to provide written consent for family/significant other to be provided Family/Significant Other Suicide Prevention Education during admission and/or prior to discharge.  Physician notified.  SPE completed with pt. SPI pamphlet provided to pt and he was encouraged to share information with support network, ask questions, and talk about any concerns relating to SPE.   Smart, Byrne Capek LCSWA  06/17/2013, 11:02 AM

## 2013-06-17 NOTE — B[facility] Group Notes (Signed)
[facility] Group Notes:  (Nursing/MHT/Case Management/Adjunct)  Date:  06/17/2013  Time: 09:00 Type of Therapy:  Psychoeducational Skills  Participation Level:  Did Not Attend   Oliva BustardShimp, Susi Goslin Larraine 06/17/2013, 3:10 PM
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
## 2013-06-17 NOTE — Progress Notes (Signed)
Shonna Chockhaplin Richard Gross [phone] notified of Spiritual Care Consult states will be seen by [facility].

## 2013-06-17 NOTE — Progress Notes (Signed)
Recreation Therapy Notes  Date: 02.24.2015 Time: 2:45pm Location: [address] Day[address]   Group Topic: Wellness  Goal Area(s) Addresses:  Patient will define components of whole wellness. Patient will verbalize benefit of whole wellness.  Behavioral Response: Did not attend.   Marykay Lexenise L Chanler Schreiter, LRT/CTRS  Mikelle Myrick L 06/17/2013 9:22 AM
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
## 2013-06-17 NOTE — [facility] Group Notes (Signed)
[facility] LCSW Group Therapy  06/17/2013 1:54 PM  Type of Therapy:  Group Therapy  Participation Level:  Did Not Attend-pt preparing for d/c. Refused to attend group.  [name], Edward Chrismer LCSWA 06/17/2013, 1:54 PM

## 2013-06-18 MED ORDER — PAROXETINE HCL 40 MG PO TABS: 40.0000 mg | ORAL_TABLET | Freq: Every day | ORAL | Status: DC

## 2013-06-18 MED ORDER — PANTOPRAZOLE SODIUM 40 MG PO TBEC: 40.0000 mg | DELAYED_RELEASE_TABLET | Freq: Every day | ORAL | Status: DC

## 2013-06-18 MED ORDER — GABAPENTIN 300 MG PO CAPS: 600.0000 mg | ORAL_CAPSULE | Freq: Four times a day (QID) | ORAL | Status: DC

## 2013-06-18 MED ORDER — LEVOTHYROXINE SODIUM 100 MCG PO TABS: 100.0000 ug | ORAL_TABLET | Freq: Every morning | ORAL | Status: DC

## 2013-06-18 MED ORDER — TRAZODONE HCL 50 MG PO TABS: 50.0000 mg | ORAL_TABLET | Freq: Every evening | ORAL | Status: DC | PRN

## 2013-06-18 NOTE — Discharge Summary (Signed)
Physician Discharge Summary Note  Patient:  Edward [name] is an 53 y.o., male MRN:  [id number] DOB:  05/22/1960 Patient phone:  [phone] (home)  Patient address:   [address] [place] [address],  Total Time spent with patient: Greater than 30 minutes  Date of Admission:  06/13/2013  Date of Discharge: 06/17/13  Reason for Admission:  Alcohol/drug detox  Discharge Diagnoses: Active Problems:   Major depressive disorder   Psychiatric Specialty Exam: Physical Exam  Constitutional: He appears well-developed and well-nourished.    ROS  Blood pressure 121/82, pulse 91, temperature 97.4 F (36.3 C), temperature source Oral, resp. rate 16, height 5\' 8"  (1.727 m), weight 77.565 kg (171 lb).Body mass index is 26.01 kg/(m^2).  General Appearance: Casual and Fairly Groomed  Patent attorney::  Good  Speech:  Clear and Coherent  Volume:  Normal  Mood:  Stable  Affect:  Appropriate and Congruent  Thought Process:  Coherent and Intact  Orientation:  Full (Time, Place, and Person)  Thought Content:  Rumination  Suicidal Thoughts:  No  Homicidal Thoughts:  No  Memory:  Immediate;   Good Recent;   Good Remote;   Good  Judgement:  Good  Insight:  Present  Psychomotor Activity:  Normal  Concentration:  Good  Recall:  Good  Fund of Knowledge:Fair  Language: Good  Akathisia:  No  Handed:  Right  AIMS (if indicated):     Assets:  Desire for Improvement  Sleep:  Number of Hours: 1.75    Past Psychiatric History: Diagnosis: Alcohol dependence, PTSD, Opioid dependence, Cocaine dependence  Hospitalizations:  X numerous times  Outpatient Care: [facility]  Substance Abuse Care: [facility]  Self-Mutilation: None reported  Suicidal Attempts: None reported  Violent Behaviors: [place] Spaniel "he has killed someone in the past"   Musculoskeletal: Strength & Muscle Tone: Walks witl in limp, uses a cane Gait & Station: Steady, with has some limping Patient leans: N/A  DSM5: Schizophrenia  Disorders:  NA Obsessive-Compulsive Disorders:  NA Trauma-Stressor Disorders:  Posttraumatic Stress Disorder (309.81) Substance/Addictive Disorders:  Alcohol Related Disorder - Severe (303.90), Cannabis Use Disorder - Severe (304.30) and Opioid Disorder - Severe (304.00) Depressive Disorders:  Major Depressive Disorder - Moderate (296.22)  Axis Diagnosis:   AXIS I:  Alcohol Related Disorder - Severe (303.90), Cannabis Use Disorder - Severe (304.30) and Opioid Disorder - Severe (304.00), PTSD, Major depressive disorder AXIS II:  Deferred AXIS III:   Past Medical History  Diagnosis Date  . Thyroid disease   . Hypothyroidism   . Mental disorder   . Depression   . Anxiety and depression   . Major depression   . PTSD (post-traumatic stress disorder)   . Hepatitis     hep C  . Meniscus tear 02/28/2013    Right side  . Neuromuscular disorder     pinched nerve (on 02/28/13 pt denies having this problem)   AXIS IV:  Polysubstance dependence AXIS V:  62  Level of Care:  OP  Hospital Course:  53 Y/O male on one of multiple admissions to this hospital. He has historyy of alcohol and cocaine dependence. States he had been going from place to place from town to town from shelter to shelter until he cant stay there anymore and moves on to the next place. States he has an underlying depression that does not go away. Had some to drink and some crack. The depression got a little worst after he drank. He endorsed having SI, came to the  ED. He has been on the Paxil up until the last few days. Thinking about going back to [place]. He is conflicted about this decision as being there has its own stress but he will be closer to family  Edward [name] was admitted to [facility] with a blood alcohol level of 266 per toxicology report and (+) THC per UDS report. He was also intoxicated requiring detoxification treatment. Edward [name] is battling depressive mood requiring mood stabilization. His liver enzymes are elevated as a  result, not a candidate for Librium detox protocol. His detoxification treatment was accomplished using Ativan tablets, a short acting benzodiazepine regimen in a tapering dose format. He also was medicated with Paroxetine 40 mg daily for depression, Gabapentin 300 mg three times daily for substance withdrawal syndrome, Trazodone 50 mg Q bedtime for sleep. He was also resumed on all his other pertinent home medication for other health issue presented. Edward [name] participated in the group sessions, AA/NA meetings being offered on this unit. He learned coping skills that should help him after discharge to cope better.   Edward [name] has completed detox treatment and his mood has stabilized. This is evidenced by his reports of improved mood and absence of withdrawal symptoms. He is currently being discharged to follow-up care at the [facility] here in [place], [place]. He has been provided with all the pertinent information required to make this appointment. He received from [facility] a 14 days worth supply samples of his [facility] discharge medications. Upon discharge, he denies any SIHI, AVH, delusional thoughts, paranoia and or withdrawal symptoms. He left [facility] with all personal belongings in no apparent distress. Transportation per [facility] bus. [facility] patient with Bus pass.  Consults:  psychiatry  Significant Diagnostic Studies:  labs: Reviewed current lab values on file, no changes  Discharge Vitals:   Blood pressure 121/82, pulse 91, temperature 97.4 F (36.3 C), temperature source Oral, resp. rate 16, height 5\' 8"  (1.727 m), weight 77.565 kg (171 lb). Body mass index is 26.01 kg/(m^2). Lab Results:   No results found for this or any previous visit (from the past 72 hour(s)).  Physical Findings: AIMS: Facial and Oral Movements Muscles of Facial Expression: None, normal Lips and Perioral Area: None, normal Jaw: None, normal Tongue: None, normal,Extremity Movements Upper (arms, wrists, hands, fingers): None,  normal Lower (legs, knees, ankles, toes): None, normal, Trunk Movements Neck, shoulders, hips: None, normal, Overall Severity Severity of abnormal movements (highest score from questions above): None, normal Incapacitation due to abnormal movements: None, normal Patient's awareness of abnormal movements (rate only patient's report): No Awareness, Dental Status Current problems with teeth and/or dentures?: Yes Does patient usually wear dentures?: No  CIWA:  CIWA-Ar Total: 12 COWS:     Psychiatric Specialty Exam: See Psychiatric Specialty Exam and Suicide Risk Assessment completed by Attending Physician prior to discharge.  Discharge destination:  [facility]  Is patient on multiple antipsychotic therapies at discharge:  No   Has Patient had three or more failed trials of antipsychotic monotherapy by history:  No  Recommended Plan for Multiple Antipsychotic Therapies: NA     Medication List    STOP taking these medications       ARIPiprazole 10 MG tablet  Commonly known as:  ABILIFY     gabapentin 600 MG tablet  Commonly known as:  NEURONTIN  Replaced by:  gabapentin 300 MG capsule     ibuprofen 800 MG tablet  Commonly known as:  ADVIL,MOTRIN      TAKE these medications  Indication   gabapentin 300 MG capsule  Commonly known as:  NEURONTIN  Take 2 capsules (600 mg total) by mouth 4 (four) times daily.   Indication:  mood stabilization     levothyroxine 100 MCG tablet  Commonly known as:  SYNTHROID, LEVOTHROID  Take 1 tablet (100 mcg total) by mouth every morning. For thyroid disease.   Indication:  Underactive Thyroid     pantoprazole 40 MG tablet  Commonly known as:  PROTONIX  Take 1 tablet (40 mg total) by mouth daily. For acid reflux   Indication:  Acid reflux     PARoxetine 40 MG tablet  Commonly known as:  PAXIL  Take 1 tablet (40 mg total) by mouth daily.   Indication:  mood stabilization     traZODone 50 MG tablet  Commonly known as:  DESYREL   Take 1 tablet (50 mg total) by mouth at bedtime as needed for sleep.   Indication:  Trouble Sleeping       Follow-up Information   Follow up with [facility]. (Walk in between 8am-9am Monday through Friday for hospital followup/medication management/assessment for therapy services. )    Contact information:   [address] [place], [place] [address] Phone: [phone] Fax: [phone]     Follow-up recommendations: Activity:  As tolerated Diet: As recommended by your primary care doctor. Keep all scheduled follow-up appointments as recommended.  Continue to work your relapse prevention plan Comments: Take all your medications as prescribed by your mental healthcare provider. Report any adverse effects and or reactions from your medicines to your outpatient provider promptly. Patient is instructed and cautioned to not engage in alcohol and or illegal drug use while on prescription medicines. In the event of worsening symptoms, patient is instructed to call the crisis hotline, 911 and or go to the nearest ED for appropriate evaluation and treatment of symptoms. Follow-up with your primary care provider for your other medical issues, concerns and or health care needs.   Total Discharge Time:  Greater than 30 minutes.  Signed: Sanjuana Kavawoko, Agnes I, PMHNP-BC 06/18/2013, 3:11 PM Agree with assessment and plan Madie RenoIrving A. Dub MikesLugo, M.D.

## 2013-06-19 NOTE — Progress Notes (Signed)
Patient Discharge Instructions:  After Visit Summary (AVS):   Faxed to:  06/19/13 Discharge Summary Note:   Faxed to:  06/19/13 Psychiatric Admission Assessment Note:   Faxed to:  06/19/13 Suicide Risk Assessment - Discharge Assessment:   Faxed to:  06/19/13 Faxed/Sent to the Next Level Care provider:  06/19/13 [facility] @ [phone]  Jerelene ReddenSheena E Bandon, 06/19/2013, 3:51 PM

## 2013-06-30 ENCOUNTER — Encounter (HOSPITAL_COMMUNITY): Payer: Self-pay | Admitting: Emergency Medicine

## 2013-06-30 ENCOUNTER — Emergency Department (EMERGENCY_DEPARTMENT_HOSPITAL)
Admission: EM | Admit: 2013-06-30 | Discharge: 2013-07-01 | Disposition: A | Discharge status: Other Acute Inpatient Hospital | Payer: Medicaid Other | Source: Home / Self Care | Attending: Emergency Medicine | Admitting: Emergency Medicine

## 2013-06-30 DIAGNOSIS — Z87820 Personal history of traumatic brain injury: Secondary | ICD-10-CM

## 2013-06-30 DIAGNOSIS — F411 Generalized anxiety disorder: Secondary | ICD-10-CM | POA: Diagnosis present

## 2013-06-30 DIAGNOSIS — R45851 Suicidal ideations: Secondary | ICD-10-CM | POA: Insufficient documentation

## 2013-06-30 DIAGNOSIS — F17200 Nicotine dependence, unspecified, uncomplicated: Secondary | ICD-10-CM | POA: Insufficient documentation

## 2013-06-30 DIAGNOSIS — F4310 Post-traumatic stress disorder, unspecified: Secondary | ICD-10-CM

## 2013-06-30 DIAGNOSIS — F329 Major depressive disorder, single episode, unspecified: Secondary | ICD-10-CM | POA: Insufficient documentation

## 2013-06-30 DIAGNOSIS — F1120 Opioid dependence, uncomplicated: Secondary | ICD-10-CM | POA: Diagnosis present

## 2013-06-30 DIAGNOSIS — F1410 Cocaine abuse, uncomplicated: Secondary | ICD-10-CM

## 2013-06-30 DIAGNOSIS — F1994 Other psychoactive substance use, unspecified with psychoactive substance-induced mood disorder: Secondary | ICD-10-CM | POA: Diagnosis present

## 2013-06-30 DIAGNOSIS — F1010 Alcohol abuse, uncomplicated: Secondary | ICD-10-CM | POA: Insufficient documentation

## 2013-06-30 DIAGNOSIS — Z87828 Personal history of other (healed) physical injury and trauma: Secondary | ICD-10-CM | POA: Insufficient documentation

## 2013-06-30 DIAGNOSIS — F1210 Cannabis abuse, uncomplicated: Secondary | ICD-10-CM

## 2013-06-30 DIAGNOSIS — Z9119 Patient's noncompliance with other medical treatment and regimen: Secondary | ICD-10-CM

## 2013-06-30 DIAGNOSIS — F489 Nonpsychotic mental disorder, unspecified: Secondary | ICD-10-CM

## 2013-06-30 DIAGNOSIS — Z8619 Personal history of other infectious and parasitic diseases: Secondary | ICD-10-CM

## 2013-06-30 DIAGNOSIS — G4700 Insomnia, unspecified: Secondary | ICD-10-CM | POA: Diagnosis present

## 2013-06-30 DIAGNOSIS — F1020 Alcohol dependence, uncomplicated: Secondary | ICD-10-CM | POA: Diagnosis present

## 2013-06-30 DIAGNOSIS — E039 Hypothyroidism, unspecified: Secondary | ICD-10-CM

## 2013-06-30 DIAGNOSIS — Z79899 Other long term (current) drug therapy: Secondary | ICD-10-CM | POA: Insufficient documentation

## 2013-06-30 DIAGNOSIS — F332 Major depressive disorder, recurrent severe without psychotic features: Principal | ICD-10-CM | POA: Diagnosis present

## 2013-06-30 DIAGNOSIS — Z91199 Patient's noncompliance with other medical treatment and regimen due to unspecified reason: Secondary | ICD-10-CM

## 2013-06-30 DIAGNOSIS — F1920 Other psychoactive substance dependence, uncomplicated: Secondary | ICD-10-CM | POA: Diagnosis present

## 2013-06-30 LAB — RAPID URINE DRUG SCREEN, HOSP PERFORMED
Amphetamines: NOT DETECTED
Barbiturates: NOT DETECTED
Benzodiazepines: NOT DETECTED
COCAINE: POSITIVE — AB
OPIATES: NOT DETECTED
TETRAHYDROCANNABINOL: POSITIVE — AB

## 2013-06-30 MED ORDER — GABAPENTIN 300 MG PO CAPS
600.0000 mg | ORAL_CAPSULE | Freq: Four times a day (QID) | ORAL | Status: DC
  Administered 2013-07-01 (×3): 600 mg via ORAL
  Filled 2013-06-30 (×5): qty 2

## 2013-06-30 MED ORDER — PAROXETINE HCL 20 MG PO TABS
40.0000 mg | ORAL_TABLET | Freq: Every day | ORAL | Status: DC
  Administered 2013-07-01: 40 mg via ORAL
  Filled 2013-06-30: qty 2

## 2013-06-30 MED ORDER — LEVOTHYROXINE SODIUM 100 MCG PO TABS
100.0000 ug | ORAL_TABLET | Freq: Every day | ORAL | Status: DC
  Administered 2013-07-01: 100 ug via ORAL
  Filled 2013-06-30 (×2): qty 1

## 2013-06-30 MED ORDER — PANTOPRAZOLE SODIUM 40 MG PO TBEC: 40.0000 mg | DELAYED_RELEASE_TABLET | Freq: Every day | ORAL | Status: DC

## 2013-06-30 NOTE — ED Notes (Signed)
Pt has been changed and wanded  

## 2013-06-30 NOTE — ED Notes (Signed)
[facility], he states he has been living in a hotel with a friend of his and doesn't want to go back because he will drink more and smoke crack, he states he his hearing voices that he knows aren't real, he has suicidal thoughts, he also states that he has lost all of his medications with his backpack. He was also arrested this afternoon for shoplifting and he's very depressed.

## 2013-06-30 NOTE — ED Provider Notes (Signed)
CSN: [id number]     Arrival date & time 06/30/13  2131 History   This chart was scribed for non-physician practitioner Arman Filter, NP, working with Nelia Shi, MD, by Yevette Edwards, ED Scribe. This patient was seen in room [facility] and the patient's care was started at 11:07 PM. First MD Initiated Contact with Patient 06/30/13 2306     Chief Complaint  Patient presents with  . Medical Clearance  . Suicidal   The history is provided by the patient. No language interpreter was used.   HPI Comments: Edward [name] is a 53 y.o. male who presents to the Emergency Department for a medical clearance. The pt was at [facility] earlier today and referred to the ED. He was charged with shop-lifting today and decided that he was "done." He states he is suicidal and is planning on cutting his wrists. He has attempted to cut his wrists in the past, but he did not draw blood. He has a h/o two overdoses with his prescriptions including gabapentin and paxil; his last overdose was several months ago. He was last hospitalized a week and a half ago. The pt states he uses alcohol, marijuana, and crack-cocaine. He also smokes cigarettes.   Past Medical History  Diagnosis Date  . Thyroid disease   . Hypothyroidism   . Mental disorder   . Depression   . Anxiety and depression   . Major depression   . PTSD (post-traumatic stress disorder)   . Hepatitis     hep C  . Meniscus tear 02/28/2013    Right side  . Neuromuscular disorder     pinched nerve (on 02/28/13 pt denies having this problem)   Past Surgical History  Procedure Laterality Date  . No past surgeries    . Testiclar cyst excision     History reviewed. No pertinent family history. History  Substance Use Topics  . Smoking status: Current Every Day Smoker -- 0.25 packs/day for 7 years    Types: Cigarettes  . Smokeless tobacco: Not on file  . Alcohol Use: 3.0 oz/week    5 Cans of beer per week     Comment: case beer daily    Review of  Systems  Psychiatric/Behavioral: Positive for suicidal ideas, self-injury and dysphoric mood.  All other systems reviewed and are negative.    Allergies  Benadryl; Tylenol; and Vistaril  Home Medications   Current Outpatient Rx  Name  Route  Sig  Dispense  Refill  . gabapentin (NEURONTIN) 300 MG capsule   Oral   Take 2 capsules (600 mg total) by mouth 4 (four) times daily. Substance withdrawal syndrome   240 capsule   0   . levothyroxine (SYNTHROID, LEVOTHROID) 100 MCG tablet   Oral   Take 1 tablet (100 mcg total) by mouth every morning. For thyroid disease.   30 tablet   0   . pantoprazole (PROTONIX) 40 MG tablet   Oral   Take 40 mg by mouth daily as needed. For acid reflux         . PARoxetine (PAXIL) 40 MG tablet   Oral   Take 1 tablet (40 mg total) by mouth daily. For depression   30 tablet   0    Triage Vitals: BP 123/80  Pulse 69  Resp 18  SpO2 94%  Physical Exam  Nursing note and vitals reviewed. Constitutional: He is oriented to person, place, and time. He appears well-developed and well-nourished. No distress.  HENT:  Head:  Normocephalic and atraumatic.  Eyes: EOM are normal.  Neck: Normal range of motion. Neck supple. No tracheal deviation present.  Cardiovascular: Normal rate.   Pulmonary/Chest: Effort normal. No respiratory distress.  Musculoskeletal: Normal range of motion.  Neurological: He is alert and oriented to person, place, and time.  Skin: Skin is warm and dry.  Psychiatric: His speech is normal and behavior is normal. He expresses inappropriate judgment. He exhibits a depressed mood. He expresses suicidal ideation. He expresses suicidal plans.    ED Course  Procedures (including critical care time)  DIAGNOSTIC STUDIES: Oxygen Saturation is 94% on room air, adequate by my interpretation.    COORDINATION OF CARE:  11:12 PM- Discussed treatment plan with patient, and the patient agreed to the plan.   Labs Review Labs Reviewed   URINE RAPID DRUG SCREEN (HOSP PERFORMED) - Abnormal; Notable for the following:    Cocaine POSITIVE (*)    Tetrahydrocannabinol POSITIVE (*)    All other components within normal limits  CBC  COMPREHENSIVE METABOLIC PANEL  ETHANOL   Imaging Review No results found.   EKG Interpretation None     Will move to Psy area for evaluation  MDM   Final diagnoses:  None       I personally performed the services described in this document, which was scribed in my presence. The recorded information has been reviewed and is accurate.     Arman FilterGail K Samba Cumba, NP 06/30/13 2316

## 2013-07-01 ENCOUNTER — Inpatient Hospital Stay (HOSPITAL_COMMUNITY)
Admission: AD | Admit: 2013-07-01 | Discharge: 2013-07-04 | DRG: 885 | Disposition: A | Discharge status: Home / Self Care | Payer: Medicaid Other | Source: Intra-hospital | Attending: Psychiatry | Admitting: Psychiatry

## 2013-07-01 ENCOUNTER — Encounter (HOSPITAL_COMMUNITY): Payer: Self-pay | Admitting: *Deleted

## 2013-07-01 DIAGNOSIS — F1020 Alcohol dependence, uncomplicated: Secondary | ICD-10-CM | POA: Diagnosis present

## 2013-07-01 DIAGNOSIS — F1120 Opioid dependence, uncomplicated: Secondary | ICD-10-CM

## 2013-07-01 DIAGNOSIS — F332 Major depressive disorder, recurrent severe without psychotic features: Secondary | ICD-10-CM

## 2013-07-01 DIAGNOSIS — F1920 Other psychoactive substance dependence, uncomplicated: Secondary | ICD-10-CM

## 2013-07-01 DIAGNOSIS — F1410 Cocaine abuse, uncomplicated: Secondary | ICD-10-CM

## 2013-07-01 DIAGNOSIS — F1010 Alcohol abuse, uncomplicated: Secondary | ICD-10-CM

## 2013-07-01 DIAGNOSIS — F4310 Post-traumatic stress disorder, unspecified: Secondary | ICD-10-CM

## 2013-07-01 DIAGNOSIS — R45851 Suicidal ideations: Secondary | ICD-10-CM

## 2013-07-01 DIAGNOSIS — F329 Major depressive disorder, single episode, unspecified: Secondary | ICD-10-CM

## 2013-07-01 LAB — CBC
HCT: 34.5 % — ABNORMAL LOW (ref 39.0–52.0)
Hemoglobin: 11.9 g/dL — ABNORMAL LOW (ref 13.0–17.0)
MCH: 29.5 pg (ref 26.0–34.0)
MCHC: 34.5 g/dL (ref 30.0–36.0)
MCV: 85.4 fL (ref 78.0–100.0)
PLATELETS: 174 10*3/uL (ref 150–400)
RBC: 4.04 MIL/uL — AB (ref 4.22–5.81)
RDW: 14.4 % (ref 11.5–15.5)
WBC: 4.9 10*3/uL (ref 4.0–10.5)

## 2013-07-01 LAB — ETHANOL: Alcohol, Ethyl (B): 69 mg/dL — ABNORMAL HIGH (ref 0–11)

## 2013-07-01 LAB — COMPREHENSIVE METABOLIC PANEL
ALT: 236 U/L — ABNORMAL HIGH (ref 0–53)
AST: 301 U/L — AB (ref 0–37)
Albumin: 3.4 g/dL — ABNORMAL LOW (ref 3.5–5.2)
Alkaline Phosphatase: 94 U/L (ref 39–117)
BUN: 17 mg/dL (ref 6–23)
CO2: 25 meq/L (ref 19–32)
CREATININE: 0.81 mg/dL (ref 0.50–1.35)
Calcium: 9.1 mg/dL (ref 8.4–10.5)
Chloride: 101 mEq/L (ref 96–112)
GFR calc Af Amer: >90 mL/min (ref >90)
Glucose, Bld: 95 mg/dL (ref 70–99)
Potassium: 3.8 mEq/L (ref 3.7–5.3)
Sodium: 141 mEq/L (ref 137–147)
Total Bilirubin: 0.5 mg/dL (ref 0.3–1.2)
Total Protein: 7.1 g/dL (ref 6.0–8.3)

## 2013-07-01 MED ORDER — PAROXETINE HCL 20 MG PO TABS
40.0000 mg | ORAL_TABLET | Freq: Every day | ORAL | Status: DC
  Administered 2013-07-02 – 2013-07-04 (×3): 40 mg via ORAL
  Filled 2013-07-01 (×4): qty 2
  Filled 2013-07-01: qty 6

## 2013-07-01 MED ORDER — IBUPROFEN 200 MG PO TABS
600.0000 mg | ORAL_TABLET | Freq: Four times a day (QID) | ORAL | Status: DC | PRN
  Administered 2013-07-01 – 2013-07-04 (×6): 600 mg via ORAL
  Filled 2013-07-01 (×6): qty 3

## 2013-07-01 MED ORDER — TRAZODONE HCL 50 MG PO TABS
50.0000 mg | ORAL_TABLET | Freq: Every evening | ORAL | Status: DC | PRN
  Administered 2013-07-01 – 2013-07-04 (×4): 50 mg via ORAL
  Filled 2013-07-01 (×3): qty 1
  Filled 2013-07-01: qty 6
  Filled 2013-07-01 (×3): qty 1
  Filled 2013-07-01: qty 6
  Filled 2013-07-01 (×2): qty 1

## 2013-07-01 MED ORDER — ALUM & MAG HYDROXIDE-SIMETH 200-200-20 MG/5ML PO SUSP: 30.0000 mL | ORAL | Status: DC | PRN

## 2013-07-01 MED ORDER — IBUPROFEN 800 MG PO TABS
800.0000 mg | ORAL_TABLET | Freq: Four times a day (QID) | ORAL | Status: DC | PRN
  Administered 2013-07-01: 800 mg via ORAL
  Filled 2013-07-01: qty 1

## 2013-07-01 MED ORDER — GABAPENTIN 300 MG PO CAPS
600.0000 mg | ORAL_CAPSULE | Freq: Four times a day (QID) | ORAL | Status: DC
  Administered 2013-07-01 – 2013-07-04 (×12): 600 mg via ORAL
  Filled 2013-07-01: qty 2
  Filled 2013-07-01: qty 24
  Filled 2013-07-01 (×5): qty 2
  Filled 2013-07-01: qty 24
  Filled 2013-07-01 (×2): qty 2
  Filled 2013-07-01: qty 24
  Filled 2013-07-01 (×7): qty 2
  Filled 2013-07-01: qty 24

## 2013-07-01 MED ORDER — LEVOTHYROXINE SODIUM 100 MCG PO TABS
100.0000 ug | ORAL_TABLET | Freq: Every day | ORAL | Status: DC
  Administered 2013-07-02 – 2013-07-04 (×3): 100 ug via ORAL
  Filled 2013-07-01 (×3): qty 1
  Filled 2013-07-01: qty 3
  Filled 2013-07-01: qty 1

## 2013-07-01 MED ORDER — MAGNESIUM HYDROXIDE 400 MG/5ML PO SUSP: 30.0000 mL | Freq: Every day | ORAL | Status: DC | PRN

## 2013-07-01 NOTE — ED Provider Notes (Signed)
Medical screening examination/treatment/procedure(s) were conducted as a shared visit with non-physician practitioner(s) and myself.  I personally evaluated the patient during the encounter.   EKG Interpretation None      Suicidal with plan. Psych hold. TTS consult. Holding orders. Medically clear  Lyanne CoKevin M Regana Kemple, MD 07/01/13 [phone]
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
## 2013-07-01 NOTE — Tx Team (Signed)
Initial Interdisciplinary Treatment Plan  PATIENT STRENGTHS: (choose at least two) Ability for insight Average or above average intelligence Capable of independent living General fund of knowledge  PATIENT STRESSORS: Financial difficulties Substance abuse   PROBLEM LIST: Problem List/Patient Goals Date to be addressed Date deferred Reason deferred Estimated date of resolution  Substance Abuse 07/01/2013     Suicidal ideations 07/01/2013                                                DISCHARGE CRITERIA:  Improved stabilization in mood, thinking, and/or behavior Need for constant or close observation no longer present Withdrawal symptoms are absent or subacute and managed without 24-hour nursing intervention  PRELIMINARY DISCHARGE PLAN: Outpatient therapy Placement in alternative living arrangements  PATIENT/FAMIILY INVOLVEMENT: This treatment plan has been presented to and reviewed with the patient, Dianne DunMyron Cantrell.  The patient and family have been given the opportunity to ask questions and make suggestions.  Leighton Parodyyson, Oakley Kossman M 07/01/2013, 4:43 PM

## 2013-07-01 NOTE — BH Assessment (Signed)
[facility] Dr. Ladona Ridgelaylor to Dr. Jannifer FranklinAkintayo. The room assignment is 407-1. Support paperwork completed.

## 2013-07-01 NOTE — Progress Notes (Signed)
[facility]:0947 Spoke with [name] denied by Dr Dorothyann GibbsUhm due to medical necessity(AST & ALT) [facility]: No Beds Open Today. [facility]:Left message on Voice Mail of Assistant Director checking status of referral. :1025 Declined due to Substance Abuse  Deatra JamesKeDra Cathey, MHT

## 2013-07-01 NOTE — Progress Notes (Signed)
D:  Pt +ve SI-contracts for safety. Pt denies HI/AVH. Pt is pleasant and cooperative. Pt stated his anxiety is the only thing that gets to him at times. Pt stated he is just so tired of feeling depressed all the time.   A: Pt was offered support and encouragement. Pt was given scheduled medications. Pt was encourage to attend groups. Q 15 minute checks were done for safety.   R:Pt attends groups and interacts well with peers and staff. Pt is taking medication.Pt receptive to treatment and safety maintained on unit.

## 2013-07-01 NOTE — Progress Notes (Signed)
Writer faxed referral [facility], [facility].  Incoming staff will follow up on referrals.
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
## 2013-07-02 ENCOUNTER — Encounter (HOSPITAL_COMMUNITY): Payer: Self-pay | Admitting: Psychiatry

## 2013-07-02 DIAGNOSIS — F4310 Post-traumatic stress disorder, unspecified: Secondary | ICD-10-CM

## 2013-07-02 DIAGNOSIS — F1020 Alcohol dependence, uncomplicated: Secondary | ICD-10-CM

## 2013-07-02 MED ORDER — NICOTINE 14 MG/24HR TD PT24
14.0000 mg | MEDICATED_PATCH | Freq: Every day | TRANSDERMAL | Status: DC
  Administered 2013-07-02 – 2013-07-04 (×3): 14 mg via TRANSDERMAL
  Filled 2013-07-02 (×6): qty 1

## 2013-07-02 MED ORDER — CHLORDIAZEPOXIDE HCL 25 MG PO CAPS
25.0000 mg | ORAL_CAPSULE | Freq: Four times a day (QID) | ORAL | Status: DC | PRN
  Administered 2013-07-02 – 2013-07-03 (×3): 25 mg via ORAL
  Filled 2013-07-02 (×4): qty 1
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
## 2013-07-02 NOTE — [facility] Group Notes (Signed)
[facility] Group Therapy  07/02/2013  12:55 PM  Type of Therapy:  [facility] Presentation   Participation Level:  Did not attend.    Summary of Progress/Problems:  David from [facility] [employer] came to present his recovery story and play the guitar.  Tina Yang   07/02/2013  12:55 PM  

## 2013-07-02 NOTE — Progress Notes (Signed)
Adult Psychoeducational Group Note  Date:  07/02/2013 Time:  9:03 PM  Group Topic/Focus:  Wrap-Up Group:   The focus of this group is to help patients review their daily goal of treatment and discuss progress on daily workbooks.  Participation Level:  Minimal  Participation Quality:  Appropriate  Affect:  Appropriate  Cognitive:  Alert  Insight: Appropriate  Engagement in Group:  Lacking  Modes of Intervention:  Discussion  Additional Comments:  Pt stated that he had an ok day. He was upset at the way Dr. Mervyn SkeetersA was talking to him. Pt stated that he signed his 72 hour form, so that he can get out of here.   Kaleen OdeaCOOKE, Arriel Victor R 07/02/2013, 9:03 PM

## 2013-07-02 NOTE — [facility] Suicide Risk Assessment (Signed)
[facility] INPATIENT:  Family/Significant Other Suicide Prevention Education  Suicide Prevention Education:  Patient Refusal for Family/Significant Other Suicide Prevention Education: The patient Edward [name] has refused to provide written consent for family/significant other to be provided Family/Significant Other Suicide Prevention Education during admission and/or prior to discharge.  Physician notified.  Simona HuhYang, Elverna Caffee 07/02/2013, 3:52 PM

## 2013-07-02 NOTE — ED Provider Notes (Signed)
CSN: [id number]     Arrival date & time 06/30/13  2131 History   First MD Initiated Contact with Patient 06/30/13 2306     Chief Complaint  Patient presents with  . Medical Clearance  . Suicidal     (Consider location/radiation/quality/duration/timing/severity/associated sxs/prior Treatment) HPI  Past Medical History  Diagnosis Date  . Thyroid disease   . Hypothyroidism   . Mental disorder   . Depression   . Anxiety and depression   . Major depression   . PTSD (post-traumatic stress disorder)   . Meniscus tear 02/28/2013    Right side  . Neuromuscular disorder     pinched nerve (on 02/28/13 pt denies having this problem)  . Hepatitis     hep C   Past Surgical History  Procedure Laterality Date  . No past surgeries    . Testiclar cyst excision     History reviewed. No pertinent family history. History  Substance Use Topics  . Smoking status: Current Every Day Smoker -- 0.25 packs/day for 7 years    Types: Cigarettes  . Smokeless tobacco: Not on file  . Alcohol Use: 3.0 oz/week    5 Cans of beer per week     Comment: case beer daily    Review of Systems    Allergies  Benadryl; Tylenol; and Vistaril  Home Medications  No current outpatient prescriptions on file. BP 128/76  Pulse 58  Temp(Src) 98.1 F (36.7 C) (Oral)  Resp 16  SpO2 97% Physical Exam  ED Course  Procedures (including critical care time) Labs Review Labs Reviewed  CBC - Abnormal; Notable for the following:    RBC 4.04 (*)    Hemoglobin 11.9 (*)    HCT 34.5 (*)    All other components within normal limits  COMPREHENSIVE METABOLIC PANEL - Abnormal; Notable for the following:    Albumin 3.4 (*)    AST 301 (*)    ALT 236 (*)    All other components within normal limits  ETHANOL - Abnormal; Notable for the following:    Alcohol, Ethyl (B) 69 (*)    All other components within normal limits  URINE RAPID DRUG SCREEN (HOSP PERFORMED) - Abnormal; Notable for the following:    Cocaine  POSITIVE (*)    Tetrahydrocannabinol POSITIVE (*)    All other components within normal limits   Imaging Review No results found.   EKG Interpretation None      MDM   Final diagnoses:  Major depressive disorder        Arman FilterGail K Lateka Rady, NP 07/02/13 0222

## 2013-07-02 NOTE — ED Provider Notes (Signed)
Medical screening examination/treatment/procedure(s) were performed by non-physician practitioner and as supervising physician I was immediately available for consultation/collaboration.   EKG Interpretation None        Davonn Flanery M Javonna Balli, MD 07/02/13 0343 

## 2013-07-02 NOTE — [facility] Suicide Risk Assessment (Signed)
   Nursing information obtained from:  Patient Demographic factors:  Male;Divorced or widowed;Caucasian;Low socioeconomic status;Unemployed Current Mental Status:  Suicidal ideation indicated by patient;Self-harm thoughts Loss Factors:  Loss of significant relationship;Financial problems / change in socioeconomic status Historical Factors:  Prior suicide attempts;Family history of mental illness or substance abuse Risk Reduction Factors:  Sense of responsibility to family;Religious beliefs about death Total Time spent with patient: 30 minutes  CLINICAL FACTORS:   Depression:   Aggression Anhedonia Comorbid alcohol abuse/dependence Hopelessness Impulsivity Recent sense of peace/wellbeing Alcohol/Substance Abuse/Dependencies Currently Psychotic  Psychiatric Specialty Exam: Physical Exam  Psychiatric: His behavior is normal. His affect is blunt. His speech is delayed and tangential. Cognition and memory are normal. He expresses impulsivity. He exhibits a depressed mood. He expresses suicidal ideation.    Review of Systems  Constitutional: Negative.   HENT: Negative.   Eyes: Negative.   Respiratory: Negative.   Cardiovascular: Negative.   Gastrointestinal: Negative.   Genitourinary: Negative.   Musculoskeletal: Negative.   Skin: Negative.   Neurological: Negative.   Endo/Heme/Allergies: Negative.   Psychiatric/Behavioral: Positive for depression, suicidal ideas and substance abuse. The patient is nervous/anxious and has insomnia.     Blood pressure 104/73, pulse 67, temperature 97.6 F (36.4 C), temperature source Oral, resp. rate 20, height 5\' 10"  (1.778 m), weight 79.833 kg (176 lb).Body mass index is 25.25 kg/(m^2).  General Appearance: Fairly Groomed  Patent attorneyye Contact::  Good  Speech:  Clear and Coherent and Normal Rate  Volume:  Normal  Mood:  Depressed  Affect:  Appropriate  Thought Process:  Goal Directed and Linear  Orientation:  Full (Time, Place, and Person)  Thought  Content:  Hallucinations: Auditory  Suicidal Thoughts:  Yes.  without intent/plan  Homicidal Thoughts:  No  Memory:  Immediate;   Fair Recent;   Fair Remote;   Fair  Judgement:  Impaired  Insight:  Lacking  Psychomotor Activity:  Normal  Concentration:  Good  Recall:  Good  Fund of Knowledge:Fair  Language: Good  Akathisia:  No  Handed:  Right  AIMS (if indicated):     Assets:  Communication Skills Desire for Improvement Physical Health  Sleep:  Number of Hours: 5.25   Musculoskeletal: Strength & Muscle Tone: within normal limits Gait & Station: normal Patient leans: N/A  COGNITIVE FEATURES THAT CONTRIBUTE TO RISK:  Polarized thinking    SUICIDE RISK:   Mild:  Suicidal ideation of limited frequency, intensity, duration, and specificity.  There are no identifiable plans, no associated intent, mild dysphoria and related symptoms, good self-control (both objective and subjective assessment), few other risk factors, and identifiable protective factors, including available and accessible social support.  PLAN OF CARE:1. Admit for crisis management and stabilization. 2. Medication management to reduce current symptoms to base line and improve the     patient's overall level of functioning 3. Treat health problems as indicated. 4. Develop treatment plan to decrease risk of relapse upon discharge and the need for     readmission. 5. Psycho-social education regarding relapse prevention and self care. 6. Health care follow up as needed for medical problems. 7. Restart home medications where appropriate.   I certify that inpatient services furnished can reasonably be expected to improve the patient's condition.  Thedore MinsAkintayo, Mellie Buccellato, MD 07/02/2013, 10:32 AM

## 2013-07-03 DIAGNOSIS — R45851 Suicidal ideations: Secondary | ICD-10-CM

## 2013-07-03 DIAGNOSIS — F1010 Alcohol abuse, uncomplicated: Secondary | ICD-10-CM

## 2013-07-03 DIAGNOSIS — F1994 Other psychoactive substance use, unspecified with psychoactive substance-induced mood disorder: Secondary | ICD-10-CM

## 2013-07-03 DIAGNOSIS — F1910 Other psychoactive substance abuse, uncomplicated: Secondary | ICD-10-CM

## 2013-07-03 DIAGNOSIS — F411 Generalized anxiety disorder: Secondary | ICD-10-CM

## 2013-07-03 DIAGNOSIS — F332 Major depressive disorder, recurrent severe without psychotic features: Principal | ICD-10-CM

## 2013-07-03 NOTE — Progress Notes (Signed)
Patient ID: [name], male   DOB: 08-05-1960, 53 y.o.   MRN: [id number] D: pt. Visible on unit, in dayroom, expressing feelings of anger, doesn't feel his appointment with doctor went well, thinks he maybe discharged tomorrow. Pt. Wants to go back to [place] where family is located. A: Writer introduces self to client, provided emotional support encouraged to challenge anger in a positive way, instead of directing it towards others. Pt. Encouraged to focus on the real reason for his admission. Staff will monitor q8015min for safety. R: Pt. Is safe on the unit, and can be redirected.

## 2013-07-03 NOTE — [facility] Group Notes (Signed)
[facility] Group Notes:  (Counselor/Nursing/MHT/Case Management/Adjunct)  07/03/2013 1:15PM  Type of Therapy:  Group Therapy  Participation Level: Invited.  Chose not to attend.  Summary of Progress/Problems: The topic for group was balance in life.  Pt participated in the discussion about when their life was in balance and out of balance and how this feels.  Pt discussed ways to get back in balance and short term goals they can work on to get where they want to be.    [name], Wynema Garoutte B 07/03/2013 1:39 PM

## 2013-07-03 NOTE — Progress Notes (Signed)
Patient ID: Edward [name], male   DOB: 10-24-1960, 53 y.o.   MRN: [id number]  D: Pt. Denies HI and A/V Hallucinations. Patient endorses SI but contracts for safety. Patient rates his depression and hopelessness at 10/10 for the day.  Patient does report pain in right knee that is chronic but refuses hot or ice pack. Patient received PRN Librium for anxiety.  A: Support and encouragement provided to the patient to come to writer with any questions or concerns. Scheduled medications given to patient per physician's orders.  R: Patient is receptive and cooperative but minimal. Patient is seen in the milieu at times but reports that he is not going to attend any groups. Q15 minute checks are maintained for safety.

## 2013-07-03 NOTE — [facility] Counselor (Signed)
Adult Psychosocial Assessment Update Interdisciplinary Team  Previous [facility] admissions/discharges:  Admissions Discharges  Date: 06/13/13 Date:  Date: 03/27/13 Date:  Date: 03/01/13 Date:  Date: 08/23/12 Date:  Date: 06/25/12 Date:   Changes since the last Psychosocial Assessment (including adherence to outpatient mental health and/or substance abuse treatment, situational issues contributing to decompensation and/or relapse). "[facility].  I was caught and have court on March 28.  I've been staying in shelters, here and there.  I hear voices when I am stressed out.  I am depressed and have thoughts of killing myself on and off.  I have been drinking and using crack cocaine."             Discharge Plan 1. Will you be returning to the same living situation after discharge?   Yes:X  [place]:      If no, what is your plan?           2. Would you like a referral for services when you are discharged? Yes:     If yes, for what services?  No:  X  "[place]            Summary and Recommendations (to be completed by the evaluator) Edward [name] is a 53 YO Caucasian male who is on his 8th admission here since August of 2013.  Presentation is much the same as others:  Substance induced mood disorder/psychosis with accompanying SI.  Pt does not follow up on an outpt basis, nor does he take prescribed medication.  He can benefit from crises stabilization, medication mangement, therapeutic mileiu, referral for services, and no further admissions to this hospital as there is nothing we can do from patients perpetually in crises who have no investment in outpt services.                       Signature:  Edward [name], Edward [name], 07/03/2013 11:04 AM

## 2013-07-03 NOTE — Progress Notes (Signed)
Nutrition Brief Note  Malnutrition Screening Tool result is inaccurate.  Please consult if nutrition needs are identified.  Bruce Churilla Baron MS, RD, LDN [phone] Pager [phone] After Hours Pager  

## 2013-07-03 NOTE — [facility] Group Notes (Signed)
[facility] Group Notes:  (Nursing/MHT/Case Management/Adjunct)  Date:  07/03/2013  Time:  10:55 AM  Type of Therapy:  Nurse Education  Participation Level:  Did Not Attend  Participation Quality:  didn't attend  Affect:  didn't attend  Cognitive:  didn't attend  Insight:  None  Engagement in Group:  None  Modes of Intervention:  didn't attend  Summary of Progress/Problems: Patient did not attend group.  Marzetta BoardDopson, Mae Denunzio E 07/03/2013, 10:55 AM

## 2013-07-03 NOTE — Progress Notes (Signed)
Patient ID: Edward [name], male   DOB: 31-May-1960, 53 y.o.   MRN: [id number] [facility] Progress Note  07/03/2013 8:08 PM Edward [name]  MRN:  [id number] Subjective:  Pt states "I don't like that doc who was here this morning. He and I don't get along. I'll listen to him, but he is very condescending and I don't have time for that shit. I am super depressed, the medications are not working at all. I need more ECT. When I took ECT and Paxil, it was great."  Objective:  Pt is very guarded, poor eye contact, appears very depressed. Pt has a flat affect through most of our conversation. Pt denies HI and AVH, but does report SI with thoughts of hanging himself. Denies intention, contracts for safety. Pt reports major dissatisfaction with his medication profile.    Diagnosis:   DSM5: Schizophrenia Disorders:  none Obsessive-Compulsive Disorders:  none Trauma-Stressor Disorders:  Posttraumatic Stress Disorder (309.81) Substance/Addictive Disorders:  Alcohol Related Disorder - Severe (303.90) Depressive Disorders:  Major Depressive Disorder - Severe (296.23)  Total Time spent with patient: 20 minutes  Axis I: Alcohol Abuse, Generalized Anxiety Disorder, Major Depression, Recurrent severe, Substance Abuse and Substance Induced Mood Disorder Axis II: Deferred Axis III:  Past Medical History  Diagnosis Date  . Thyroid disease   . Hypothyroidism   . Mental disorder   . Depression   . Anxiety and depression   . Major depression   . PTSD (post-traumatic stress disorder)   . Meniscus tear 02/28/2013    Right side  . Neuromuscular disorder     pinched nerve (on 02/28/13 pt denies having this problem)  . Hepatitis     hep C   Axis IV: other psychosocial or environmental problems and problems related to social environment Axis V: 41-50 serious symptoms  ADL's:  Intact  Sleep: Poor  Appetite:  Poor  Suicidal Ideation:  SI with plan to hang himself, but denies intention, contracts for  safety Homicidal Ideation:  Plan:  denies Intent:  denies Means:  denies AEB (as evidenced by):  Psychiatric Specialty Exam: Physical Exam  Review of Systems  Constitutional: Negative.   HENT: Negative.   Eyes: Negative.   Respiratory: Negative.   Cardiovascular: Negative.   Gastrointestinal: Negative.   Genitourinary: Negative.   Musculoskeletal: Negative.   Skin: Negative.   Neurological: Negative.   Endo/Heme/Allergies: Negative.   Psychiatric/Behavioral: Positive for depression and suicidal ideas. Negative for substance abuse. The patient is nervous/anxious. The patient does not have insomnia.     Blood pressure 104/73, pulse 67, temperature 97.6 F (36.4 C), temperature source Oral, resp. rate 20, height 5\' 10"  (1.778 m), weight 79.833 kg (176 lb).Body mass index is 25.25 kg/(m^2).  General Appearance: Fairly Groomed  Patent attorneyye Contact::  Fair  Speech:  Clear and Coherent  Volume:  Decreased  Mood:  Depressed  Affect:  Restricted  Thought Process:  Coherent and Goal Directed  Orientation:  Full (Time, Place, and Person)  Thought Content:  WDL  Suicidal Thoughts:  No  Homicidal Thoughts:  No  Memory:  Immediate;   Fair Recent;   NA Remote;   NA  Judgement:  Fair  Insight:  Present  Psychomotor Activity:  Psychomotor Retardation  Concentration:  Fair  Recall:  Fair  Fund of Knowledge:NA  Language: Fair  Akathisia:  No  Handed:    AIMS (if indicated):     Assets:  Desire for Improvement  Sleep:  Number of Hours: 6.75   Musculoskeletal:  Strength & Muscle Tone: within normal limits Gait & Station: normal Patient leans: N/A  Current Medications: Current Facility-Administered Medications  Medication Dose Route Frequency Provider Last Rate Last Dose  . alum & mag hydroxide-simeth (MAALOX/MYLANTA) 200-200-20 MG/5ML suspension 30 mL  30 mL Oral Q4H PRN Benjaman Pott, MD      . chlordiazePOXIDE (LIBRIUM) capsule 25 mg  25 mg Oral QID PRN Bolivar Koranda   25 mg at  07/03/13 1127  . gabapentin (NEURONTIN) capsule 600 mg  600 mg Oral QID Benjaman Pott, MD   600 mg at 07/03/13 1635  . ibuprofen (ADVIL,MOTRIN) tablet 600 mg  600 mg Oral Q6H PRN Kerry Hough, PA-C   600 mg at 07/03/13 1638  . levothyroxine (SYNTHROID, LEVOTHROID) tablet 100 mcg  100 mcg Oral QAC breakfast Benjaman Pott, MD   100 mcg at 07/03/13 0631  . magnesium hydroxide (MILK OF MAGNESIA) suspension 30 mL  30 mL Oral Daily PRN Benjaman Pott, MD      . nicotine (NICODERM CQ - dosed in mg/24 hours) patch 14 mg  14 mg Transdermal Daily Tyreona Panjwani   14 mg at 07/03/13 0807  . PARoxetine (PAXIL) tablet 40 mg  40 mg Oral Daily Benjaman Pott, MD   40 mg at 07/03/13 0806  . traZODone (DESYREL) tablet 50 mg  50 mg Oral QHS,MR X 1 Kerry Hough, PA-C   50 mg at 07/02/13 2145    Lab Results: No results found for this or any previous visit (from the past 48 hour(s)).  Physical Findings: AIMS: Facial and Oral Movements Muscles of Facial Expression: None, normal Lips and Perioral Area: None, normal Jaw: None, normal Tongue: None, normal,Extremity Movements Upper (arms, wrists, hands, fingers): None, normal Lower (legs, knees, ankles, toes): None, normal, Trunk Movements Neck, shoulders, hips: None, normal, Overall Severity Severity of abnormal movements (highest score from questions above): None, normal Incapacitation due to abnormal movements: None, normal Patient's awareness of abnormal movements (rate only patient's report): No Awareness, Dental Status Current problems with teeth and/or dentures?: Yes Does patient usually wear dentures?: No  CIWA:  CIWA-Ar Total: 2 COWS:     Treatment Plan Summary: Daily contact with patient to assess and evaluate symptoms and progress in treatment Medication management  Plan: Supportive approach/coping skills/relapse prevention           Continue the detox           Reassess and address the co morbidities  Medical Decision  Making Problem Points:  Established problem, worsening (2), Review of psycho-social stressors (1) and Self-limited or minor (1) Data Points:  Review or order clinical lab tests (1) Review or order medicine tests (1) Review of medication regiment & side effects (2) Review of new medications or change in dosage (2)  I certify that inpatient services furnished can reasonably be expected to improve the patient's condition.   Beau Fanny, FNP-BC 07/03/2013, 6:05 PM  Patient seen, evaluated and I agree with notes by Nurse Practitioner. Thedore Mins, MD

## 2013-07-04 DIAGNOSIS — F1920 Other psychoactive substance dependence, uncomplicated: Secondary | ICD-10-CM

## 2013-07-04 MED ORDER — TRAZODONE HCL 50 MG PO TABS: 50.0000 mg | ORAL_TABLET | Freq: Every evening | ORAL | Status: DC | PRN

## 2013-07-04 MED ORDER — PAROXETINE HCL 40 MG PO TABS: 40.0000 mg | ORAL_TABLET | Freq: Every day | ORAL | Status: DC

## 2013-07-04 MED ORDER — GABAPENTIN 300 MG PO CAPS: 600.0000 mg | ORAL_CAPSULE | Freq: Four times a day (QID) | ORAL | Status: DC

## 2013-07-04 MED ORDER — PANTOPRAZOLE SODIUM 40 MG PO TBEC: 40.0000 mg | DELAYED_RELEASE_TABLET | Freq: Every day | ORAL | Status: DC | PRN

## 2013-07-04 NOTE — Progress Notes (Signed)
Adult Psychoeducational Group Note  Date:  07/04/2013 Time:  2:17 PM  Group Topic/Focus:  Early Warning Signs:   The focus of this group is to help patients identify signs or symptoms they exhibit before slipping into an unhealthy state or crisis.  Participation Level:  Active  Participation Quality:  Appropriate and Attentive  Affect:  Appropriate  Cognitive:  Appropriate  Insight: Appropriate  Engagement in Group:  Engaged  Modes of Intervention:  Discussion, Socialization and Support  Additional Comments:  [facility] either [facility] or thrown away and did not have his medications for his anxiety and depression. Pt stated one thing he needed to change was that he is moving to [place] to be with his sister. Pt states she has also been good to him and their relationship will benefit each other once he gets back to [place] with his family.  Edward CorwinOwen, Edward [name] C 07/04/2013, 2:17 PM

## 2013-07-04 NOTE — Progress Notes (Signed)
Patient ID: Edward [name], male   DOB: [birth year], 53 y.o.   MRN: [id number] Orders received for patients discharge. Pt aware of pending discharge stating '' Yes I'm ready to go, can you get me out around 11 am ? '' Patient discharge instructions reivewed, AVS reviewed, copy provided to patient .Pt verbalized understanding and Opportunity for questions provided.Crisis services reviewed, patient verbalized understanding. He denies any SI/HI/A/V Hallucinations. No signs of acute decompensation. Rx given as well as 3 day free supply of medications. All belongings returned. Pt provided two bus passes and escorted from unit to lobby.

## 2013-07-04 NOTE — Progress Notes (Signed)
Patient ID: Edward [name], male   DOB: 1961-04-03, 53 y.o.   MRN: [id number] D: Patient mood and affect appeared depressed and flat. Pt denies feeling of anxiety and symptoms of withdrawals. Pt stated looking forward to been with family back in [place]  After discharge tomorrow. Pt denies SI/HI/AVH and pain. Pt attended evening karaoke group. Pt denies any needs or concerns.  Cooperative with assessment. No acute distressed noted at this time.   A: Met with pt 1:1. Medications administered as prescribed. Writer encouraged pt to discuss feelings. Pt encouraged to come to staff with any question or concerns.   R: Patient remains safe. He is complaint with medications and denies any adverse reaction. Continue current POC.

## 2013-07-04 NOTE — Discharge Summary (Signed)
Physician Discharge Summary Note  Patient:  Edward [name] is an 53 y.o., male MRN:  [id number] DOB:  11-14-1960 Patient phone:  [phone] (home)  Patient address:   [address] [place] [address],  Total Time spent with patient: 20 minutes  Date of Admission:  07/01/2013 Date of Discharge: 07/04/13  Reason for Admission: Depression, Substance abuse  Discharge Diagnoses: Principal Problem:   Severe recurrent major depression without psychotic features Active Problems:   Alcohol dependence   Psychiatric Specialty Exam: Physical Exam  Psychiatric: He has a normal mood and affect. His speech is normal and behavior is normal. Judgment and thought content normal. Cognition and memory are normal.    Review of Systems  Constitutional: Negative.   HENT: Negative.   Eyes: Negative.   Respiratory: Negative.   Cardiovascular: Negative.   Gastrointestinal: Negative.   Genitourinary: Negative.   Musculoskeletal: Negative.   Skin: Negative.   Neurological: Negative.   Endo/Heme/Allergies: Negative.   Psychiatric/Behavioral: Negative for depression, suicidal ideas, hallucinations, memory loss and substance abuse. The patient is not nervous/anxious and does not have insomnia.     Blood pressure 110/76, pulse 62, temperature 97.3 F (36.3 C), temperature source Oral, resp. rate 16, height 5\' 10"  (1.778 m), weight 79.833 kg (176 lb).Body mass index is 25.25 kg/(m^2).  General Appearance: Fairly Groomed  Patent attorneyye Contact::  Good  Speech:  Clear and Coherent and Normal Rate  Volume:  Normal  Mood:  Euthymic  Affect:  Appropriate  Thought Process:  Circumstantial  Orientation:  Full (Time, Place, and Person)  Thought Content:  Negative  Suicidal Thoughts:  No  Homicidal Thoughts:  No  Memory:  Immediate;   Fair Recent;   Fair Remote;   Fair  Judgement:  Fair  Insight:  Fair  Psychomotor Activity:  Normal  Concentration:  Fair  Recall:  Fis[employer]Fair  Fund of Knowledge:Good   Language: Fair  Akathisia:  No  Handed:  Right  AIMS (if indicated):     Assets:  Communication Skills Desire for Improvement Physical Health  Sleep:  Number of Hours: 6.25    Past Psychiatric History: Yes Diagnosis: Opioid dependence  [facility]  Outpatient Care: [facility]   Substance Abuse Care: [facility], [facility]   Self-Mutilation: Denies  Suicidal Attempts: Twice by overdose  Violent Behaviors: In the distant past   Musculoskeletal: Strength & Muscle Tone: within normal limits Gait & Station: normal Patient leans: N/A  DSM5:  AXIS I: Severe recurrent major depression without psychotic features  Polysubstance dependence  AXIS II: Cluster B Traits  AXIS III:  Past Medical History   Diagnosis  Date   .  Thyroid disease    .  Hypothyroidism    .  Mental disorder    .  Depression    .  Anxiety and depression    .  Major depression    .  PTSD (post-traumatic stress disorder)    .  Meniscus tear  02/28/2013     Right side   .  Neuromuscular disorder      pinched nerve (on 02/28/13 pt denies having this problem)   .  Hepatitis      hep C    AXIS IV: other psychosocial or environmental problems and problems related to social environment  AXIS V: 61-70 mild symptoms  Level of Care:  OP  Hospital Course:  Edward [name] is a 53 year old male [facility] with complaints of depression, suicidal thoughts, and hearing voices. Patient  has history of admissions to [facility] with most recent being in February 2015. Edward [name] was intoxicated on arrival to the hospital. Edward [name] endorses multiple stressors that are contributing to his depressive symptoms. Patient has been hearing voices telling him that he would be better off dead. He states today during his psychiatric assessment "I smoke crack when I have the money. I drank some vodka before I came in. Sometimes I blow all my money in one day. 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 I used to do it every day. I did not want to  go back to the hotel room where my friends are drinking and drugging. I'm depressed. No I did not follow up as directed. I used drugs instead. Took the medication as long as it lasted after I left last time. Been depressed way back to the age of 8. Had ECT 3-4 months ago at [facility]." The patient became upset when confronted by treatment team about his lack of investment in his recommended treatment that he did not comply with after past admissions. After this happened the patient abruptly said during the interview "I just want to blow my brains out. Just let me out." Patient has been talking to social worker about moving back to [place] to be closer to his sister.   Patient was admitted to the 400 hall for further assessment and medication management. His psychiatric medications which were started on a recent admission to [facility] were continued. These include Neurontin 600 mg QID for anxiety/mood stabilization, Paxil 40 mg daily for depression, and Trazodone prn for insomnia. The patient was ordered librium 25 mg QID prn alcohol withdrawal. On admission his liver enzymes were elevated likely from alcohol abuse. His urine drugs screen was positive for cocaine and marijuana. The patient became very irritable during his admission assessment, when he was confronted by the treatment team about his chronic lack of follow up after several discharges from  [facility] Edward [name] suddenly sat up stating that he would just shoot himself. Nursing staff later reported that he threatened to hurt the Doctor but no violence occurred during his admission. Patient immediately requested to sign a 72 hour request for discharge. He reported that his plan was to move to [place] to be more of a support to his sister. Patient continued to express anger about being confronted regarding his noncompliance with follow up. Edward [name] indicated a desire to have ECT again in the future. Patient reported thoughts of wanting to hang himself but  denied intention. He reported that he was motivated to live for his family and was able to contract for safety. Patient was provided with prescriptions, sample medications, and two bus passes at time of discharge.   Consults:  psychiatry  Significant Diagnostic Studies:  labs: Admission labs completed,   Discharge Vitals:   Blood pressure 110/76, pulse 62, temperature 97.3 F (36.3 C), temperature source Oral, resp. rate 16, height 5\' 10"  (1.778 m), weight 79.833 kg (176 lb). Body mass index is 25.25 kg/(m^2). Lab Results:   No results found for this or any previous visit (from the past 72 hour(s)).  Physical Findings: AIMS: Facial and Oral Movements Muscles of Facial Expression: None, normal Lips and Perioral Area: None, normal Jaw: None, normal Tongue: None, normal,Extremity Movements Upper (arms, wrists, hands, fingers): None, normal Lower (legs, knees, ankles, toes): None, normal, Trunk Movements Neck, shoulders, hips: None, normal, Overall Severity Severity of abnormal movements (highest score from questions above): None, normal Incapacitation due to abnormal movements: None, normal  Patient's awareness of abnormal movements (rate only patient's report): No Awareness, Dental Status Current problems with teeth and/or dentures?: Yes Does patient usually wear dentures?: No  CIWA:  CIWA-Ar Total: 2 COWS:     Psychiatric Specialty Exam: See Psychiatric Specialty Exam and Suicide Risk Assessment completed by Attending Physician prior to discharge.  Discharge destination:  Home  Is patient on multiple antipsychotic therapies at discharge:  No   Has Patient had three or more failed trials of antipsychotic monotherapy by history:  No  Recommended Plan for Multiple Antipsychotic Therapies: NA  Discharge Orders   Future Orders Complete By Expires   Discharge instructions  As directed    Comments:     Please follow up with your Primary Care MD for further management of your  medical problems such as hypothyroidism.       Medication List       Indication   gabapentin 300 MG capsule  Commonly known as:  NEURONTIN  Take 2 capsules (600 mg total) by mouth 4 (four) times daily.   Indication:  Agitation, Alcohol Withdrawal Syndrome, Pain, mood stabilization     levothyroxine 100 MCG tablet  Commonly known as:  SYNTHROID, LEVOTHROID  Take 1 tablet (100 mcg total) by mouth every morning. For thyroid disease.   Indication:  Underactive Thyroid     pantoprazole 40 MG tablet  Commonly known as:  PROTONIX  Take 1 tablet (40 mg total) by mouth daily as needed. For acid reflux   Indication:  Gastroesophageal Reflux Disease     PARoxetine 40 MG tablet  Commonly known as:  PAXIL  Take 1 tablet (40 mg total) by mouth daily.   Indication:  Major Depressive Disorder     traZODone 50 MG tablet  Commonly known as:  DESYREL  Take 1 tablet (50 mg total) by mouth at bedtime and may repeat dose one time if needed.   Indication:  Trouble Sleeping           Follow-up Information   Follow up with [facility]. (Go to the walk-in clinic M-F between 8 and9AM for your hospital follow up appointment)    Contact information:   [address]  [place]  [phone]      Follow-up recommendations:   Activity: as tolerated  Diet: healthy  Tests: routine Other: patient to keep his aftercare appointment   Comments:   Take all your medications as prescribed by your mental healthcare provider.  Report any adverse effects and or reactions from your medicines to your outpatient provider promptly.  Patient is instructed and cautioned to not engage in alcohol and or illegal drug use while on prescription medicines.  In the event of worsening symptoms, patient is instructed to call the crisis hotline, 911 and or go to the nearest ED for appropriate evaluation and treatment of symptoms.  Follow-up with your primary care provider for your other medical issues, concerns and or  health care needs.   Total Discharge Time:  Greater than 30 minutes.  SignedFransisca Kaufmann NP-C 07/04/2013, 10:54 AM  Patient seen, evaluated and I agree with notes by Nurse Practitioner. Thedore Mins, MD

## 2013-07-04 NOTE — [facility] Suicide Risk Assessment (Signed)
Demographic Factors:  Male, Caucasian, Low socioeconomic status and Unemployed  Total Time spent with patient: 20 minutes  Psychiatric Specialty Exam: Physical Exam  Psychiatric: He has a normal mood and affect. His speech is normal and behavior is normal. Judgment and thought content normal. Cognition and memory are normal.    Review of Systems  Constitutional: Negative.   HENT: Negative.   Eyes: Negative.   Respiratory: Negative.   Cardiovascular: Negative.   Gastrointestinal: Negative.   Genitourinary: Negative.   Musculoskeletal: Negative.   Skin: Negative.   Neurological: Negative.   Endo/Heme/Allergies: Negative.   Psychiatric/Behavioral: Negative.     Blood pressure 110/76, pulse 62, temperature 97.3 F (36.3 C), temperature source Oral, resp. rate 16, height 5\' 10"  (1.778 m), weight 79.833 kg (176 lb).Body mass index is 25.25 kg/(m^2).  General Appearance: Fairly Groomed  Patent attorney::  Good  Speech:  Clear and Coherent and Normal Rate  Volume:  Normal  Mood:  Euthymic  Affect:  Appropriate  Thought Process:  Circumstantial  Orientation:  Full (Time, Place, and Person)  Thought Content:  Negative  Suicidal Thoughts:  No  Homicidal Thoughts:  No  Memory:  Immediate;   Fair Recent;   Fair Remote;   Fair  Judgement:  Fair  Insight:  Fair  Psychomotor Activity:  Normal  Concentration:  Fair  Recall:  [employer] of Knowledge:Good  Language: Fair  Akathisia:  No  Handed:  Right  AIMS (if indicated):     Assets:  Communication Skills Desire for Improvement Physical Health  Sleep:  Number of Hours: 6.25    Musculoskeletal: Strength & Muscle Tone: within normal limits Gait & Station: normal Patient leans: N/A   Mental Status Per Nursing Assessment::   On Admission:  Suicidal ideation indicated by patient;Self-harm thoughts  Current Mental Status by Physician: patient denies suicidal ideation, intent or plan  Loss Factors: Financial  problems/change in socioeconomic status  Historical Factors: Recurrent substance abuse  Risk Reduction Factors:   Sense of responsibility to family, Living with another person, especially a relative and Positive social support  Continued Clinical Symptoms:  Alcohol/Substance Abuse/Dependencies  Cognitive Features That Contribute To Risk:  Closed-mindedness Polarized thinking    Suicide Risk:  Minimal: No identifiable suicidal ideation.  Patients presenting with no risk factors but with morbid ruminations; may be classified as minimal risk based on the severity of the depressive symptoms  Discharge Diagnoses:   AXIS I:  Severe recurrent major depression without psychotic features              Polysubstance dependence AXIS II:  Cluster B Traits AXIS III:   Past Medical History  Diagnosis Date  . Thyroid disease   . Hypothyroidism   . Mental disorder   . Depression   . Anxiety and depression   . Major depression   . PTSD (post-traumatic stress disorder)   . Meniscus tear 02/28/2013    Right side  . Neuromuscular disorder     pinched nerve (on 02/28/13 pt denies having this problem)  . Hepatitis     hep C   AXIS IV:  other psychosocial or environmental problems and problems related to social environment AXIS V:  61-70 mild symptoms  Plan Of Care/Follow-up recommendations:  Activity:  as tolerated Diet:  healthy Tests:  rouitne Other:  patient to keep his aftercare appointment  Is patient on multiple antipsychotic therapies at discharge:  No   Has Patient had three or more failed trials of  antipsychotic monotherapy by history:  No  Recommended Plan for Multiple Antipsychotic Therapies: NA    Thedore MinsAkintayo, Athanasia Stanwood, MD 07/04/2013, 10:27 AM

## 2013-07-08 ENCOUNTER — Emergency Department (HOSPITAL_COMMUNITY)
Admission: EM | Admit: 2013-07-08 | Discharge: 2013-07-08 | Disposition: A | Discharge status: Home / Self Care | Payer: Medicaid Other | Attending: Emergency Medicine | Admitting: Emergency Medicine

## 2013-07-08 ENCOUNTER — Encounter (HOSPITAL_COMMUNITY): Payer: Self-pay | Admitting: Emergency Medicine

## 2013-07-08 DIAGNOSIS — B1920 Unspecified viral hepatitis C without hepatic coma: Secondary | ICD-10-CM | POA: Insufficient documentation

## 2013-07-08 DIAGNOSIS — F341 Dysthymic disorder: Secondary | ICD-10-CM | POA: Insufficient documentation

## 2013-07-08 DIAGNOSIS — Z79899 Other long term (current) drug therapy: Secondary | ICD-10-CM | POA: Insufficient documentation

## 2013-07-08 DIAGNOSIS — E039 Hypothyroidism, unspecified: Secondary | ICD-10-CM | POA: Insufficient documentation

## 2013-07-08 DIAGNOSIS — F1010 Alcohol abuse, uncomplicated: Secondary | ICD-10-CM | POA: Insufficient documentation

## 2013-07-08 DIAGNOSIS — F17200 Nicotine dependence, unspecified, uncomplicated: Secondary | ICD-10-CM | POA: Insufficient documentation

## 2013-07-08 DIAGNOSIS — F4310 Post-traumatic stress disorder, unspecified: Secondary | ICD-10-CM | POA: Insufficient documentation

## 2013-07-08 MED ORDER — CHLORDIAZEPOXIDE HCL 25 MG PO CAPS: 25.0000 mg | ORAL_CAPSULE | Freq: Four times a day (QID) | ORAL | Status: AC | PRN

## 2013-07-08 NOTE — ED Notes (Signed)
Per EMS: Pt homeless.  Requesting Etoh detox.  Did crack last night.  Etoh this morning.  Denies SI/HI.  No hx of seizures.

## 2013-07-08 NOTE — ED Provider Notes (Signed)
CSN: [id number]     Arrival date & time 07/08/13  1005 History   First MD Initiated Contact with Patient 07/08/13 1010     Chief Complaint  Patient presents with  . Medical Clearance     (Consider location/radiation/quality/duration/timing/severity/associated sxs/prior Treatment) The history is provided by the patient.   patient requesting detox from alcohol and crack cocaine. Patient last use of substances last night. Denies any suicidal homicidal ideations. Denies any hallucinations. No history of alcohol withdrawal seizures. Has been in inpatient rehabilitation for the past. Denies abdominal pain. No black or bloody stools. Denies any vomiting.  Past Medical History  Diagnosis Date  . Thyroid disease   . Hypothyroidism   . Mental disorder   . Depression   . Anxiety and depression   . Major depression   . PTSD (post-traumatic stress disorder)   . Meniscus tear 02/28/2013    Right side  . Neuromuscular disorder     pinched nerve (on 02/28/13 pt denies having this problem)  . Hepatitis     hep C   Past Surgical History  Procedure Laterality Date  . No past surgeries    . Testiclar cyst excision     No family history on file. History  Substance Use Topics  . Smoking status: Current Every Day Smoker -- 0.25 packs/day for 7 years    Types: Cigarettes  . Smokeless tobacco: Not on file  . Alcohol Use: 3.0 oz/week    5 Cans of beer per week     Comment: case beer daily    Review of Systems  All other systems reviewed and are negative.      Allergies  Benadryl; Tylenol; and Vistaril  Home Medications   Current Outpatient Rx  Name  Route  Sig  Dispense  Refill  . gabapentin (NEURONTIN) 300 MG capsule   Oral   Take 2 capsules (600 mg total) by mouth 4 (four) times daily.   240 capsule   0   . ibuprofen (ADVIL,MOTRIN) 800 MG tablet   Oral   Take 800 mg by mouth 3 (three) times daily.         Marland Kitchen levothyroxine (SYNTHROID, LEVOTHROID) 100 MCG tablet   Oral  Take 1 tablet (100 mcg total) by mouth every morning. For thyroid disease.   30 tablet   0   . pantoprazole (PROTONIX) 40 MG tablet   Oral   Take 1 tablet (40 mg total) by mouth daily as needed. For acid reflux         . PARoxetine (PAXIL) 40 MG tablet   Oral   Take 1 tablet (40 mg total) by mouth daily.   30 tablet   0   . traZODone (DESYREL) 50 MG tablet   Oral   Take 1 tablet (50 mg total) by mouth at bedtime and may repeat dose one time if needed.   60 tablet   0    BP 115/77  Pulse 55  Temp(Src) 97.7 F (36.5 C) (Oral)  Resp 16  SpO2 96% Physical Exam  Nursing note and vitals reviewed. Constitutional: He is oriented to person, place, and time. He appears well-developed and well-nourished.  Non-toxic appearance. No distress.  HENT:  Head: Normocephalic and atraumatic.  Eyes: Conjunctivae, EOM and lids are normal. Pupils are equal, round, and reactive to light.  Neck: Normal range of motion. Neck supple. No tracheal deviation present. No mass present.  Cardiovascular: Normal rate, regular rhythm and normal heart sounds.  Exam reveals  no gallop.   No murmur heard. Pulmonary/Chest: Effort normal and breath sounds normal. No stridor. No respiratory distress. He has no decreased breath sounds. He has no wheezes. He has no rhonchi. He has no rales.  Abdominal: Soft. Normal appearance and bowel sounds are normal. He exhibits no distension. There is no tenderness. There is no rebound and no CVA tenderness.  Musculoskeletal: Normal range of motion. He exhibits no edema and no tenderness.  Neurological: He is alert and oriented to person, place, and time. He has normal strength. No cranial nerve deficit or sensory deficit. GCS eye subscore is 4. GCS verbal subscore is 5. GCS motor subscore is 6.  Skin: Skin is warm and dry. No abrasion and no rash noted.  Psychiatric: He has a normal mood and affect. His speech is normal and behavior is normal. He expresses no suicidal plans and  no homicidal plans.    ED Course  Procedures (including critical care time) Labs Review Labs Reviewed - No data to display Imaging Review No results found.   EKG Interpretation None      MDM   Final diagnoses:  None    Patient without evidence of withdrawal at this time. Will give patient prescription for her benzodiazepines and referral for outpatient detox    Toy BakerAnthony T Kerri Kovacik, MD 07/08/13 1124

## 2013-07-08 NOTE — ED Notes (Signed)
Bed: WTR5 Expected date:  Expected time:  Means of arrival:  Comments: detox

## 2013-07-08 NOTE — Discharge Instructions (Signed)
Alcohol Withdrawal °Anytime drug use is interfering with normal living activities it has become abuse. This includes problems with family and friends. Psychological dependence has developed when your mind tells you that the drug is needed. This is usually followed by physical dependence when a continuing increase of drugs are required to get the same feeling or "high." This is known as addiction or chemical dependency. A person's risk is much higher if there is a history of chemical dependency in the family. °Mild Withdrawal Following Stopping Alcohol, When Addiction or Chemical Dependency Has Developed °When a person has developed tolerance to alcohol, any sudden stopping of alcohol can cause uncomfortable physical symptoms. Most of the time these are mild and consist of tremors in the hands and increases in heart rate, breathing, and temperature. Sometimes these symptoms are associated with anxiety, panic attacks, and bad dreams. There may also be stomach upset. Normal sleep patterns are often interrupted with periods of inability to sleep (insomnia). This may last for 6 months. Because of this discomfort, many people choose to continue drinking to get rid of this discomfort and to try to feel normal. °Severe Withdrawal with Decreased or No Alcohol Intake, When Addiction or Chemical Dependency Has Developed °About five percent of alcoholics will develop signs of severe withdrawal when they stop using alcohol. One sign of this is development of generalized seizures (convulsions). Other signs of this are severe agitation and confusion. This may be associated with believing in things which are not real or seeing things which are not really there (delusions and hallucinations). Vitamin deficiencies are usually present if alcohol intake has been long-term. Treatment for this most often requires hospitalization and close observation. °Addiction can only be helped by stopping use of all chemicals. This is hard but may  save your life. With continual alcohol use, possible outcomes are usually loss of self respect and esteem, violence, and death. °Addiction cannot be cured but it can be stopped. This often requires outside help and the care of professionals. Treatment centers are listed in the yellow pages under Cocaine, Narcotics, and Alcoholics Anonymous. Most hospitals and clinics can refer you to a specialized care center. °It is not necessary for you to go through the uncomfortable symptoms of withdrawal. Your caregiver can provide you with medicines that will help you through this difficult period. Try to avoid situations, friends, or drugs that made it possible for you to keep using alcohol in the past. Learn how to say no. °It takes a long period of time to overcome addictions to all drugs, including alcohol. There may be many times when you feel as though you want a drink. After getting rid of the physical addiction and withdrawal, you will have a lessening of the craving which tells you that you need alcohol to feel normal. Call your caregiver if more support is needed. Learn who to talk to in your family and among your friends so that during these periods you can receive outside help. Alcoholics Anonymous (AA) has helped many people over the years. To get further help, contact AA or call your caregiver, counselor, or clergyperson. Al-Anon and Alateen are support groups for friends and family members of an alcoholic. The people who love and care for an alcoholic often need help, too. For information about these organizations, check your phone directory or call a local alcoholism treatment center.  °SEEK IMMEDIATE MEDICAL CARE IF:  °· You have a seizure. °· You have a fever. °· You experience uncontrolled vomiting or you   vomit up blood. This may be bright red or look like black coffee grounds.  You have blood in the stool. This may be bright red or appear as a black, tarry, bad-smelling stool.  You become lightheaded or  faint. Do not drive if you feel this way. Have someone else drive you or call [phone] for help.  You become more agitated or confused.  You develop uncontrolled anxiety.  You begin to see things that are not really there (hallucinate). Your caregiver has determined that you completely understand your medical condition, and that your mental state is back to normal. You understand that you have been treated for alcohol withdrawal, have agreed not to drink any alcohol for a minimum of 1 day, will not operate a car or other machinery for 24 hours, and have had an opportunity to ask any questions about your condition. Document Released: 01/18/2005 Document Revised: 07/03/2011 Document Reviewed: 11/27/2007 [facility] Patient Information 2014 [employer], [place].      Behavioral Health Resources in the [facility]  Intensive Outpatient Programs: [facility]      [address] [place], [place] [phone] Both a day and evening program       [facility]     [address]        [place], [place] [address] [phone]         [facility]: [facility] [address] [place] [phone]  [facility] ACCESS LINE: [phone] or [phone] [address] [place], [place] [address] [email or web address]  Mobile Crisis Teams:                                        [facility]         Mobile Crisis Care Unit [phone]             [facility] [address] [place] [phone]                                         [facility]vention[facility] [address]haron DeEsch [address] [place] [phone]  Self-Help/Support Groups: Mental Health Assoc[facility] of [place] Variety of support groups [phone] (call for more info)  Narcotics Anonymous (NA) [facility] [address] [place] - 2 meetings at this  location  Residential Treatment Programs:  [facility]      [address]        [place]       [phone]         [facility] [address], [place] [address] [place], [place]  [address] [phone]  [facility]  [address] [place], [place] [address] [phone] Admissions: 8am-3pm M-F  [facility]     [address]        [place], [place] [address]       [phone]         [facility] [address] [place], [place] [phone]  [facility] [address] [place], [place] [phone]  [facility] - Intensive Outpatient      [address]     [place], [place]       [phone]  [facility]     [address] [place], [place] [phone] or [phone]  [facility] ([facility])  [address] [place], [place] [phone]  [facility] [address]                                               [address] [place] [phone]  [facility] Resources: [place] [facility]850 [phone]               General Therapy                                                Angie Fava, PhD        [address] [place], [place] [address]         [phone]   Insurance  [facility]   [address] [place], [place] [address] [phone]  [facility] [address] [place], [place] [address] [phone] Insurance/Medicaid/sponsorship through [facility] [place] and Families                                              [address]                                        [place], [place] [address]    Therapy/tele-psych/case         [phone]          [facility] [address], [place]  [address]  Adolescent/group home/case management [phone]                                           Creola Corn  PhD       General therapy       Insurance   [phone]         Dr. Lolly Mustache Insurance [phone] M-F  [facility] Medicaid, sponsorship [phone]

## 2013-07-08 NOTE — Treatment Plan (Addendum)
Pt is declined by [facility] medicine as well Administration for reaching therapeutic benefit at [facility] as evidenced by multiple admissions over the last several years. Pt has been confronted regarding his lack of participation in our program resulting in the need for continued admissions. During his last admission he threatened to "kick the Doctor's ass." If inpatient placement is needed it is recommended that he goes to [facility] or be placed in a group home or assisted living as he has proven an inability to meet his own needs. Pt has had 6 [facility] admissions since March 2014.  

## 2013-07-09 ENCOUNTER — Encounter (HOSPITAL_COMMUNITY): Payer: Self-pay | Admitting: Emergency Medicine

## 2013-07-09 ENCOUNTER — Emergency Department (HOSPITAL_COMMUNITY)
Admission: EM | Admit: 2013-07-09 | Discharge: 2013-07-11 | Disposition: A | Discharge status: Home / Self Care | Payer: Medicaid Other | Attending: Emergency Medicine | Admitting: Emergency Medicine

## 2013-07-09 DIAGNOSIS — F1410 Cocaine abuse, uncomplicated: Secondary | ICD-10-CM

## 2013-07-09 DIAGNOSIS — F1310 Sedative, hypnotic or anxiolytic abuse, uncomplicated: Secondary | ICD-10-CM | POA: Insufficient documentation

## 2013-07-09 DIAGNOSIS — F1210 Cannabis abuse, uncomplicated: Secondary | ICD-10-CM | POA: Insufficient documentation

## 2013-07-09 DIAGNOSIS — Z791 Long term (current) use of non-steroidal anti-inflammatories (NSAID): Secondary | ICD-10-CM | POA: Insufficient documentation

## 2013-07-09 DIAGNOSIS — R110 Nausea: Secondary | ICD-10-CM | POA: Insufficient documentation

## 2013-07-09 DIAGNOSIS — F17200 Nicotine dependence, unspecified, uncomplicated: Secondary | ICD-10-CM | POA: Insufficient documentation

## 2013-07-09 DIAGNOSIS — Z79899 Other long term (current) drug therapy: Secondary | ICD-10-CM | POA: Insufficient documentation

## 2013-07-09 DIAGNOSIS — Z87828 Personal history of other (healed) physical injury and trauma: Secondary | ICD-10-CM | POA: Insufficient documentation

## 2013-07-09 DIAGNOSIS — R45851 Suicidal ideations: Secondary | ICD-10-CM

## 2013-07-09 DIAGNOSIS — G709 Myoneural disorder, unspecified: Secondary | ICD-10-CM | POA: Insufficient documentation

## 2013-07-09 DIAGNOSIS — E039 Hypothyroidism, unspecified: Secondary | ICD-10-CM | POA: Insufficient documentation

## 2013-07-09 DIAGNOSIS — Z8619 Personal history of other infectious and parasitic diseases: Secondary | ICD-10-CM | POA: Insufficient documentation

## 2013-07-09 DIAGNOSIS — R440 Auditory hallucinations: Secondary | ICD-10-CM

## 2013-07-09 DIAGNOSIS — R1011 Right upper quadrant pain: Secondary | ICD-10-CM | POA: Insufficient documentation

## 2013-07-09 DIAGNOSIS — F411 Generalized anxiety disorder: Secondary | ICD-10-CM | POA: Insufficient documentation

## 2013-07-09 DIAGNOSIS — F1010 Alcohol abuse, uncomplicated: Secondary | ICD-10-CM | POA: Insufficient documentation

## 2013-07-09 DIAGNOSIS — F32A Depression, unspecified: Secondary | ICD-10-CM

## 2013-07-09 DIAGNOSIS — F489 Nonpsychotic mental disorder, unspecified: Secondary | ICD-10-CM | POA: Insufficient documentation

## 2013-07-09 DIAGNOSIS — R443 Hallucinations, unspecified: Secondary | ICD-10-CM | POA: Insufficient documentation

## 2013-07-09 DIAGNOSIS — F4310 Post-traumatic stress disorder, unspecified: Secondary | ICD-10-CM | POA: Insufficient documentation

## 2013-07-09 DIAGNOSIS — F1020 Alcohol dependence, uncomplicated: Secondary | ICD-10-CM

## 2013-07-09 DIAGNOSIS — F329 Major depressive disorder, single episode, unspecified: Secondary | ICD-10-CM

## 2013-07-09 LAB — ETHANOL: Alcohol, Ethyl (B): <11 mg/dL (ref 0–11)

## 2013-07-09 LAB — COMPREHENSIVE METABOLIC PANEL
ALT: 246 U/L — AB (ref 0–53)
AST: 203 U/L — AB (ref 0–37)
Albumin: 3.9 g/dL (ref 3.5–5.2)
Alkaline Phosphatase: 105 U/L (ref 39–117)
BUN: 22 mg/dL (ref 6–23)
CALCIUM: 9.1 mg/dL (ref 8.4–10.5)
CO2: 27 mEq/L (ref 19–32)
Chloride: 99 mEq/L (ref 96–112)
Creatinine, Ser: 0.91 mg/dL (ref 0.50–1.35)
GFR calc non Af Amer: >90 mL/min (ref >90)
Glucose, Bld: 106 mg/dL — ABNORMAL HIGH (ref 70–99)
Potassium: 4 mEq/L (ref 3.7–5.3)
SODIUM: 138 meq/L (ref 137–147)
TOTAL PROTEIN: 7.9 g/dL (ref 6.0–8.3)
Total Bilirubin: 0.7 mg/dL (ref 0.3–1.2)

## 2013-07-09 LAB — CBC
HCT: 37.8 % — ABNORMAL LOW (ref 39.0–52.0)
Hemoglobin: 12.9 g/dL — ABNORMAL LOW (ref 13.0–17.0)
MCH: 29.5 pg (ref 26.0–34.0)
MCHC: 34.1 g/dL (ref 30.0–36.0)
MCV: 86.3 fL (ref 78.0–100.0)
Platelets: 190 10*3/uL (ref 150–400)
RBC: 4.38 MIL/uL (ref 4.22–5.81)
RDW: 14.2 % (ref 11.5–15.5)
WBC: 5.9 10*3/uL (ref 4.0–10.5)

## 2013-07-09 LAB — RAPID URINE DRUG SCREEN, HOSP PERFORMED
Amphetamines: NOT DETECTED
BENZODIAZEPINES: POSITIVE — AB
Barbiturates: NOT DETECTED
COCAINE: POSITIVE — AB
Opiates: NOT DETECTED
TETRAHYDROCANNABINOL: POSITIVE — AB

## 2013-07-09 MED ORDER — IBUPROFEN 200 MG PO TABS
600.0000 mg | ORAL_TABLET | Freq: Three times a day (TID) | ORAL | Status: DC | PRN
  Administered 2013-07-10 – 2013-07-11 (×3): 600 mg via ORAL
  Filled 2013-07-09 (×3): qty 3

## 2013-07-09 MED ORDER — GABAPENTIN 300 MG PO CAPS
600.0000 mg | ORAL_CAPSULE | Freq: Four times a day (QID) | ORAL | Status: DC
  Administered 2013-07-09 – 2013-07-11 (×8): 600 mg via ORAL
  Filled 2013-07-09 (×9): qty 2

## 2013-07-09 MED ORDER — PANTOPRAZOLE SODIUM 40 MG PO TBEC
40.0000 mg | DELAYED_RELEASE_TABLET | Freq: Every day | ORAL | Status: DC
  Administered 2013-07-09 – 2013-07-11 (×3): 40 mg via ORAL
  Filled 2013-07-09 (×3): qty 1

## 2013-07-09 MED ORDER — PAROXETINE HCL 20 MG PO TABS
40.0000 mg | ORAL_TABLET | Freq: Every day | ORAL | Status: DC
  Administered 2013-07-10 – 2013-07-11 (×2): 40 mg via ORAL
  Filled 2013-07-09 (×2): qty 2

## 2013-07-09 MED ORDER — TRAZODONE HCL 50 MG PO TABS
50.0000 mg | ORAL_TABLET | Freq: Every evening | ORAL | Status: DC | PRN
  Administered 2013-07-09 – 2013-07-10 (×2): 50 mg via ORAL
  Filled 2013-07-09 (×2): qty 1

## 2013-07-09 MED ORDER — LEVOTHYROXINE SODIUM 100 MCG PO TABS
100.0000 ug | ORAL_TABLET | Freq: Every day | ORAL | Status: DC
  Administered 2013-07-10 – 2013-07-11 (×2): 100 ug via ORAL
  Filled 2013-07-09 (×3): qty 1

## 2013-07-09 MED ORDER — CHLORDIAZEPOXIDE HCL 25 MG PO CAPS: 25.0000 mg | ORAL_CAPSULE | Freq: Four times a day (QID) | ORAL | Status: DC | PRN

## 2013-07-09 MED ORDER — ZOLPIDEM TARTRATE 5 MG PO TABS: 5.0000 mg | ORAL_TABLET | Freq: Every evening | ORAL | Status: DC | PRN

## 2013-07-09 MED ORDER — NICOTINE 21 MG/24HR TD PT24
21.0000 mg | MEDICATED_PATCH | Freq: Every day | TRANSDERMAL | Status: DC
  Administered 2013-07-09 – 2013-07-11 (×3): 21 mg via TRANSDERMAL
  Filled 2013-07-09 (×3): qty 1

## 2013-07-09 MED ORDER — LORAZEPAM 1 MG PO TABS
1.0000 mg | ORAL_TABLET | Freq: Three times a day (TID) | ORAL | Status: DC | PRN
  Administered 2013-07-11: 1 mg via ORAL
  Filled 2013-07-09: qty 1

## 2013-07-09 MED ORDER — ALUM & MAG HYDROXIDE-SIMETH 200-200-20 MG/5ML PO SUSP: 30.0000 mL | ORAL | Status: DC | PRN

## 2013-07-09 MED ORDER — ONDANSETRON HCL 4 MG PO TABS: 4.0000 mg | ORAL_TABLET | Freq: Three times a day (TID) | ORAL | Status: DC | PRN

## 2013-07-09 NOTE — Progress Notes (Signed)
Patient Discharge Instructions:  After Visit Summary (AVS):   Faxed to:  07/09/13 Discharge Summary Note:   Faxed to:  07/09/13 Psychiatric Admission Assessment Note:   Faxed to:  07/09/13 Suicide Risk Assessment - Discharge Assessment:   Faxed to:  07/09/13 Faxed/Sent to the Next Level Care provider:  07/09/13 [facility] @ [phone]  Jerelene ReddenSheena E [place], 07/09/2013, 4:15 PM

## 2013-07-09 NOTE — ED Notes (Signed)
Pt states he is here for depression, anxiety, and suicidal ideations  Pt states he has been drinking and doing drugs for the past couple of days and has been off his meds for the past 5 days

## 2013-07-09 NOTE — ED Provider Notes (Signed)
CSN: [id number]     Arrival date & time 07/09/13  1948 History  This chart was scribed for non-physician practitioner, Junious Silk, PA-C working with Laray Anger, DO by Luisa Dago, ED scribe. This patient was seen in room WTR3/WLPT3 and the patient's care was started at 9:17 PM.     Chief Complaint  Patient presents with  . Medical Clearance    HPI HPI Comments: Edward [name] is a 53 y.o. male with a history of Hep C, presents to the Emergency Department complaining of depression that started today. He reports that he has attempted suicide in the past. Pt is also complaining of SI, nausea, and abdominal pain. Pt describes his abdominal pain as stabbing and that it started about 2 days ago. He states that he's been using marijuana and crack cocaine yesterday. He states that he is also hearing voices, which tell him to kill himself and that he is worth nothing. Denies any fever, chills, or diaphoresis.     Past Medical History  Diagnosis Date  . Thyroid disease   . Hypothyroidism   . Mental disorder   . Depression   . Anxiety and depression   . Major depression   . PTSD (post-traumatic stress disorder)   . Meniscus tear 02/28/2013    Right side  . Neuromuscular disorder     pinched nerve (on 02/28/13 pt denies having this problem)  . Hepatitis     hep C   Past Surgical History  Procedure Laterality Date  . No past surgeries    . Testiclar cyst excision     History reviewed. No pertinent family history. History  Substance Use Topics  . Smoking status: Current Every Day Smoker -- 0.25 packs/day for 7 years    Types: Cigarettes  . Smokeless tobacco: Not on file  . Alcohol Use: 10.8 oz/week    18 Cans of beer per week     Comment: case beer daily    Review of Systems  Constitutional: Negative for fever, chills and diaphoresis.  Respiratory: Negative for shortness of breath.   Cardiovascular: Negative for chest pain.  Gastrointestinal: Positive for nausea and  abdominal pain.  Psychiatric/Behavioral: Positive for suicidal ideas.  All other systems reviewed and are negative.      Allergies  Benadryl; Tylenol; and Vistaril  Home Medications   Current Outpatient Rx  Name  Route  Sig  Dispense  Refill  . chlordiazePOXIDE (LIBRIUM) 25 MG capsule   Oral   Take 1 capsule (25 mg total) by mouth 4 (four) times daily as needed for withdrawal.   12 capsule   0   . gabapentin (NEURONTIN) 300 MG capsule   Oral   Take 2 capsules (600 mg total) by mouth 4 (four) times daily.   240 capsule   0   . ibuprofen (ADVIL,MOTRIN) 800 MG tablet   Oral   Take 800 mg by mouth 3 (three) times daily.         Marland Kitchen levothyroxine (SYNTHROID, LEVOTHROID) 100 MCG tablet   Oral   Take 1 tablet (100 mcg total) by mouth every morning. For thyroid disease.   30 tablet   0   . pantoprazole (PROTONIX) 40 MG tablet   Oral   Take 1 tablet (40 mg total) by mouth daily as needed. For acid reflux         . PARoxetine (PAXIL) 40 MG tablet   Oral   Take 1 tablet (40 mg total) by mouth daily.  30 tablet   0   . traZODone (DESYREL) 50 MG tablet   Oral   Take 1 tablet (50 mg total) by mouth at bedtime and may repeat dose one time if needed.   60 tablet   0    BP 140/94  Pulse 74  Temp(Src) 97.8 F (36.6 C) (Oral)  Resp 16  SpO2 97%  Physical Exam  Nursing note and vitals reviewed. Constitutional: He is oriented to person, place, and time. He appears well-developed and well-nourished.  Non-toxic appearance. He does not have a sickly appearance. He does not appear ill. No distress.  HENT:  Head: Normocephalic and atraumatic.  Right Ear: External ear normal.  Left Ear: External ear normal.  Nose: Nose normal.  Eyes: Conjunctivae are normal.  Neck: Normal range of motion. No tracheal deviation present.  Cardiovascular: Normal rate, regular rhythm and normal heart sounds.   Pulmonary/Chest: Effort normal and breath sounds normal. No stridor.   Abdominal: Soft. He exhibits no distension. There is tenderness in the right upper quadrant. There is no rigidity, no rebound and no guarding.  Tender to palpation to upper right quadrant. No rebound.  Musculoskeletal: Normal range of motion.  Neurological: He is alert and oriented to person, place, and time.  Skin: Skin is warm and dry. He is not diaphoretic.  Psychiatric: His behavior is normal. His affect is labile. He exhibits a depressed mood. He expresses suicidal ideation. He expresses no homicidal ideation. He expresses suicidal plans.    ED Course  Procedures (including critical care time)  DIAGNOSTIC STUDIES: Oxygen Saturation is 97% on RA, normal by my interpretation.    COORDINATION OF CARE: 9:22 PM- Pt advised of plan for treatment and pt agrees.  Labs Review Labs Reviewed  CBC - Abnormal; Notable for the following:    Hemoglobin 12.9 (*)    HCT 37.8 (*)    All other components within normal limits  COMPREHENSIVE METABOLIC PANEL - Abnormal; Notable for the following:    Glucose, Bld 106 (*)    AST 203 (*)    ALT 246 (*)    All other components within normal limits  URINE RAPID DRUG SCREEN (HOSP PERFORMED) - Abnormal; Notable for the following:    Cocaine POSITIVE (*)    Benzodiazepines POSITIVE (*)    Tetrahydrocannabinol POSITIVE (*)    All other components within normal limits  ETHANOL   Imaging Review No results found.   EKG Interpretation None      MDM   Final diagnoses:  Suicidal ideation  Auditory hallucination  Depression    Patient presents to the ED for medical clearance. He has been having auditory hallucinations telling him to kill himself. Patient has been drinking beer every day for the past 4 days. He admits to using cocaine, benzos, and marijuana. Patient is medically cleared. TTS consulted. Vital signs stable.   I personally performed the services described in this documentation, which was scribed in my presence. The recorded  information has been reviewed and is accurate.    Mora BellmanHannah S Livie Vanderhoof, PA-C 07/10/13 [id number]

## 2013-07-09 NOTE — Treatment Plan (Signed)
[facility] as well Administration for [facility] as evidenced by multiple admissions over the last several years. Pt has been confronted regarding his lack of participation in our program resulting in the need for continued admissions. During his last admission he threatened to "kick the Doctor's ass." If inpatient placement is needed it is recommended that [facility] to [facility] or be placed in a group home or assisted living as he has proven an inability to meet his own needs. [facility].

## 2013-07-10 DIAGNOSIS — F1920 Other psychoactive substance dependence, uncomplicated: Secondary | ICD-10-CM

## 2013-07-10 DIAGNOSIS — R45851 Suicidal ideations: Secondary | ICD-10-CM

## 2013-07-10 DIAGNOSIS — F331 Major depressive disorder, recurrent, moderate: Secondary | ICD-10-CM

## 2013-07-10 NOTE — Progress Notes (Signed)
Writer faxed the following facilities for placement :   [facility]   RardenJessica Jermayne Sweeney,MHT
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
## 2013-07-10 NOTE — Consult Note (Signed)
  Review of Systems  Constitutional: Negative.   HENT: Negative.   Eyes: Negative.   Respiratory: Negative.   Cardiovascular: Negative.   Gastrointestinal: Negative.   Genitourinary: Negative.   Musculoskeletal: Negative.   Skin: Negative.   Neurological: Negative.   Endo/Heme/Allergies: Negative.   Psychiatric/Behavioral: Positive for depression and substance abuse.   No medical complaints

## 2013-07-10 NOTE — ED Notes (Signed)
Patient has been lying in his bed sleeping much of the day.  He has tolerated medications well.  Appetite fair.  Has been cooperative with staff.

## 2013-07-10 NOTE — Progress Notes (Signed)
   CARE MANAGEMENT ED NOTE 07/10/2013  Patient:  Edward [name],Edward [name]   Account Number:  [id number]  Date Initiated:  07/10/2013  Documentation initiated by:  Radford PaxFERRERO,Joe Tanney  Subjective/Objective Assessment:   Patient presents to Ed with depression anxiety and SI.     Subjective/Objective Assessment Detail:     Action/Plan:   Action/Plan Detail:   Anticipated DC Date:       Status Recommendation to Physician:   Result of Recommendation:    Other ED Services  Consult Working Plan    DC Planning Services  Other  PCP issues    Choice offered to / List presented to:            Status of service:  Completed, signed off  ED Comments:   ED Comments Detail:  Patient confirms he does not have a pcp with [place].  [facility] list of pcp's who accept Medicaid [place] in [place].  No further EDCM needs at this time.

## 2013-07-11 DIAGNOSIS — F329 Major depressive disorder, single episode, unspecified: Secondary | ICD-10-CM

## 2013-07-11 DIAGNOSIS — F1420 Cocaine dependence, uncomplicated: Secondary | ICD-10-CM

## 2013-07-11 DIAGNOSIS — F3289 Other specified depressive episodes: Secondary | ICD-10-CM

## 2013-07-11 DIAGNOSIS — F1220 Cannabis dependence, uncomplicated: Secondary | ICD-10-CM

## 2013-07-11 MED ORDER — PANTOPRAZOLE SODIUM 40 MG PO TBEC: 40.0000 mg | DELAYED_RELEASE_TABLET | Freq: Every day | ORAL | Status: AC | PRN

## 2013-07-11 MED ORDER — TRAZODONE HCL 50 MG PO TABS: 50.0000 mg | ORAL_TABLET | Freq: Every evening | ORAL | Status: AC | PRN

## 2013-07-11 MED ORDER — PAROXETINE HCL 40 MG PO TABS: 40.0000 mg | ORAL_TABLET | Freq: Every day | ORAL | Status: AC

## 2013-07-11 MED ORDER — GABAPENTIN 300 MG PO CAPS: 600.0000 mg | ORAL_CAPSULE | Freq: Four times a day (QID) | ORAL | Status: AC

## 2013-07-11 MED ORDER — LEVOTHYROXINE SODIUM 100 MCG PO TABS: 100.0000 ug | ORAL_TABLET | Freq: Every morning | ORAL | Status: AC

## 2013-07-11 NOTE — Progress Notes (Signed)
CSW obtained [employer]tra[employer] ticket for patient. Patient to dc at 1130pm for bus station to catch train at 344am departure to [place], [place]. Per patient, he will be able to find a way from [place] to his sister's house. Patient psychiatrically stable for discharge. Bus pass, and train ticket given to [employer].   Byrd HesselbachKristen Keishana Klinger, LCSW [phone]  ED CSW 07/11/2013 1317pm

## 2013-07-11 NOTE — ED Provider Notes (Signed)
Medical screening examination/treatment/procedure(s) were performed by non-physician practitioner and as supervising physician I was immediately available for consultation/collaboration.   EKG Interpretation None        Susen Haskew M Margery Szostak, DO 07/11/13 1435 

## 2013-07-11 NOTE — Progress Notes (Signed)
Pt requested to speak with CSW. Pt asked, "Do hospitals usually help with transportation home?" CSW stated that once patient is psychiatrically stable the hospital can help to some extent but it depends on the situation and what resources are available. Patient states, "Well I really just want to go to [place] to get closer to my sister." CSW asked patient if he had any thoughts of SI/HI/AH/VH, pt denies all. Pt states, "I just get stuck in this state and I can't get out and then I become suicidal.." Patient states, "If I can get closer to my sister she can help me, I can find a pcp and follow up with a psychiatrist." CSW shared info with the psychiatrist.    Byrd HesselbachKristen Katarzyna Wolven, LCSW [phone]  ED CSW 07/11/2013 1136pm

## 2013-07-11 NOTE — [facility] Suicide Risk Assessment (Signed)
Suicide Risk Assessment  Discharge Assessment     Demographic Factors:  Male  Total Time spent with patient: 30 minutes  Psychiatric Specialty Exam:     Blood pressure 115/75, pulse 72, temperature 97.5 F (36.4 C), temperature source Oral, resp. rate 16, SpO2 96.00%.There is no weight on file to calculate BMI.  General Appearance: Casual  Eye Contact::  Fair  Speech:  Slow  Volume:  Decreased  Mood:  Euthymic  Affect:  Congruent  Thought Process:  Coherent and Linear  Orientation:  Full (Time, Place, and Person)  Thought Content:  Rumination  Suicidal Thoughts:  No  Homicidal Thoughts:  No  Memory:  Recent;   Fair  Judgement:  Fair  Insight:  Shallow  Psychomotor Activity:  Decreased  Concentration:  Fair  Recall:  [employer]  Fund of Knowledge:Fair  Language: Fair  Akathisia:  Negative  Handed:  Right  AIMS (if indicated):     Assets:  Desire for Improvement Transportation  Sleep:       Musculoskeletal: Strength & Muscle Tone: within normal limits Gait & Station: normal Patient leans: Backward   Mental Status Per Nursing Assessment::   On Admission:     Current Mental Status by Physician: see MSE  Loss Factors: Financial problems/change in socioeconomic status  Historical Factors: Impulsivity  Risk Reduction Factors:   Positive social support and Positive coping skills or problem solving skills  Continued Clinical Symptoms:  Dysthymia Alcohol/Substance Abuse/Dependencies Previous Psychiatric Diagnoses and Treatments  Cognitive Features That Contribute To Risk:  Closed-mindedness    Suicide Risk:  Minimal: No identifiable suicidal ideation.  Patients presenting with no risk factors but with morbid ruminations; may be classified as minimal risk based on the severity of the depressive symptoms  Discharge Diagnoses:   AXIS I:  Substance Induced Mood Disorder and Cocaine and marijuana use disorder, moderate.. Depressive disorder unspecified AXIS II:   Deferred AXIS III:   Past Medical History  Diagnosis Date  . Thyroid disease   . Hypothyroidism   . Mental disorder   . Depression   . Anxiety and depression   . Major depression   . PTSD (post-traumatic stress disorder)   . Meniscus tear 02/28/2013    Right side  . Neuromuscular disorder     pinched nerve (on 02/28/13 pt denies having this problem)  . Hepatitis     hep C   AXIS IV:  other psychosocial or environmental problems AXIS V:  51-60 moderate symptoms  Plan Of Care/Follow-up recommendations:  Activity:  as tolerated Diet:  regular  Discharge self. Wanting to go [place]. Arranged a bus pass. Follow up with outpatient services.  Is patient on multiple antipsychotic therapies at discharge:  No   Has Patient had three or more failed trials of antipsychotic monotherapy by history:  No  Recommended Plan for Multiple Antipsychotic Therapies: NA    Abby Stines MD 07/11/2013, 11:53 AM

## 2013-07-11 NOTE — Progress Notes (Signed)
CSW working on bus pass for patient. Pt dc pending upon bus ticket.   Byrd HesselbachKristen Sheffield Hawker, LCSW [phone]  ED CSW 07/11/2013 1214pm

## 2013-07-11 NOTE — ED Notes (Signed)
Patient discharge via ambulatory with bus pass and train ticket. Patient verbalized understanding. No acute distress noted.

## 2013-07-11 NOTE — Consult Note (Signed)
[name] initially presented with depression and wanted to be admitted. Lying in bed and today feels better. He understands he has been admitted few times and difficult to get a bed. He is changing his story and wants to go [place] if he gets a bus pass and denies suicidal thoughts.   Denies psychotic symptoms. Has shown poor insight regarding his drug use. His vitals and chart reviewed. Vitals are stable.  Psychiatric Specialty Exam: Physical Exam  ROS  Blood pressure 115/75, pulse 72, temperature 97.5 F (36.4 C), temperature source Oral, resp. rate 16, SpO2 96.00%.There is no weight on file to calculate BMI.  General Appearance: Casual  Eye Contact::  Fair  Speech:  Slow  Volume:  Decreased  Mood:  Euthymic  Affect:  Congruent  Thought Process:  Coherent and Linear  Orientation:  Full (Time, Place, and Person)  Thought Content:  Rumination  Suicidal Thoughts:  No  Homicidal Thoughts:  No  Memory:  Recent;   Fair  Judgement:  Fair  Insight:  Shallow  Psychomotor Activity:  Decreased  Concentration:  Fair  Recall:  Fair  Akathisia:  Negative  Handed:  Right  AIMS (if indicated):     Assets:  Desire for Improvement Leisure Time  Sleep:      A: Depressive disorder, unspecified. R/O substance induced mood disorder. Polysubstance dependence.  Marijuana and cocaine use disorder, moderate  Plan: [facility] [place]. Denies suicidal thoughts.  Discharge and [place] with outpatient [place] in [place]. He understands has to make appointments and follow up there.

## 2014-06-15 IMAGING — CR DG CHEST 2V
2 series · 2 of 2 positions shown · non-contrast
Comparison: DG CHEST 2V dated 05/12/2013

CLINICAL DATA: Central chest pain.

EXAM:
CHEST  2 VIEW

[w chest pa]
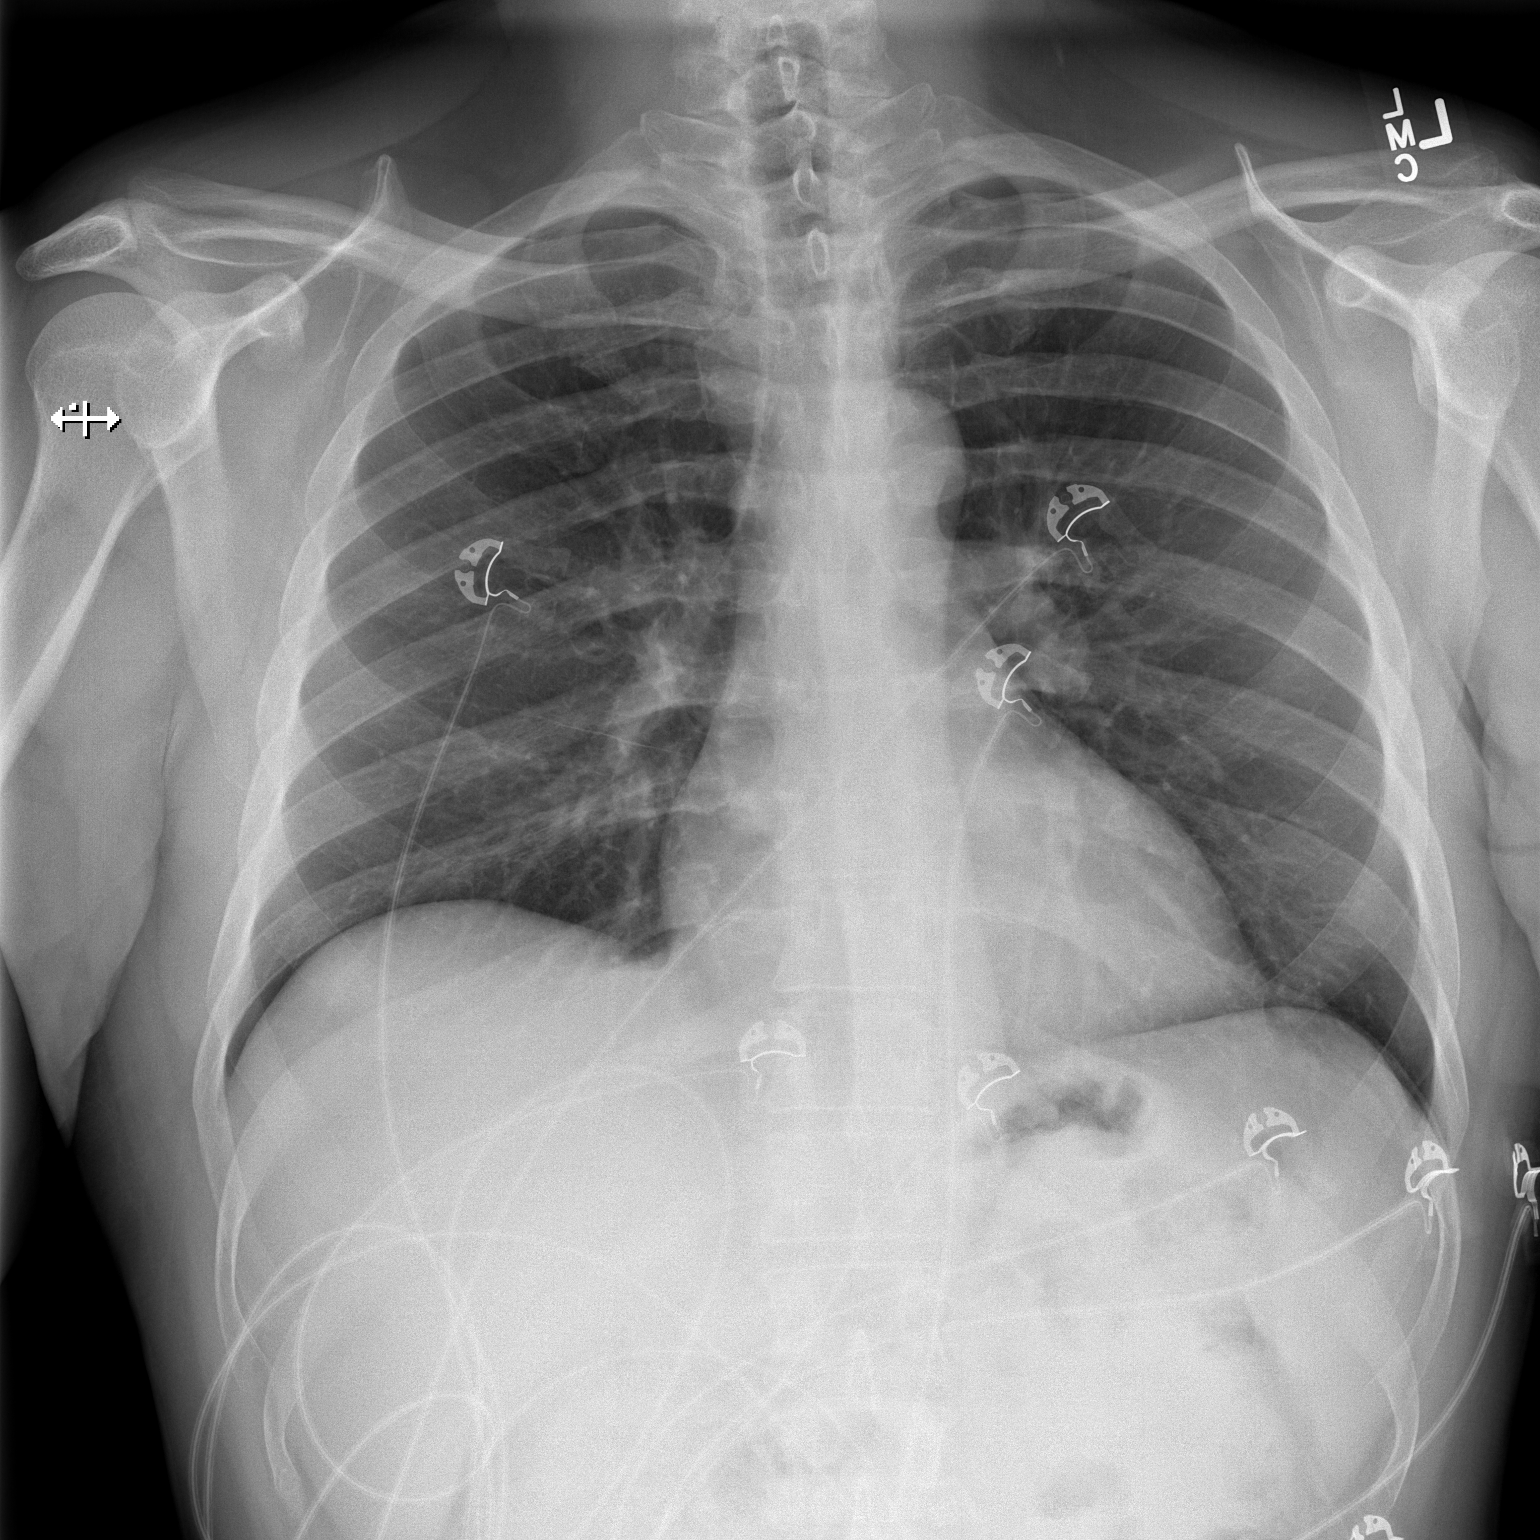

[w chest lat]
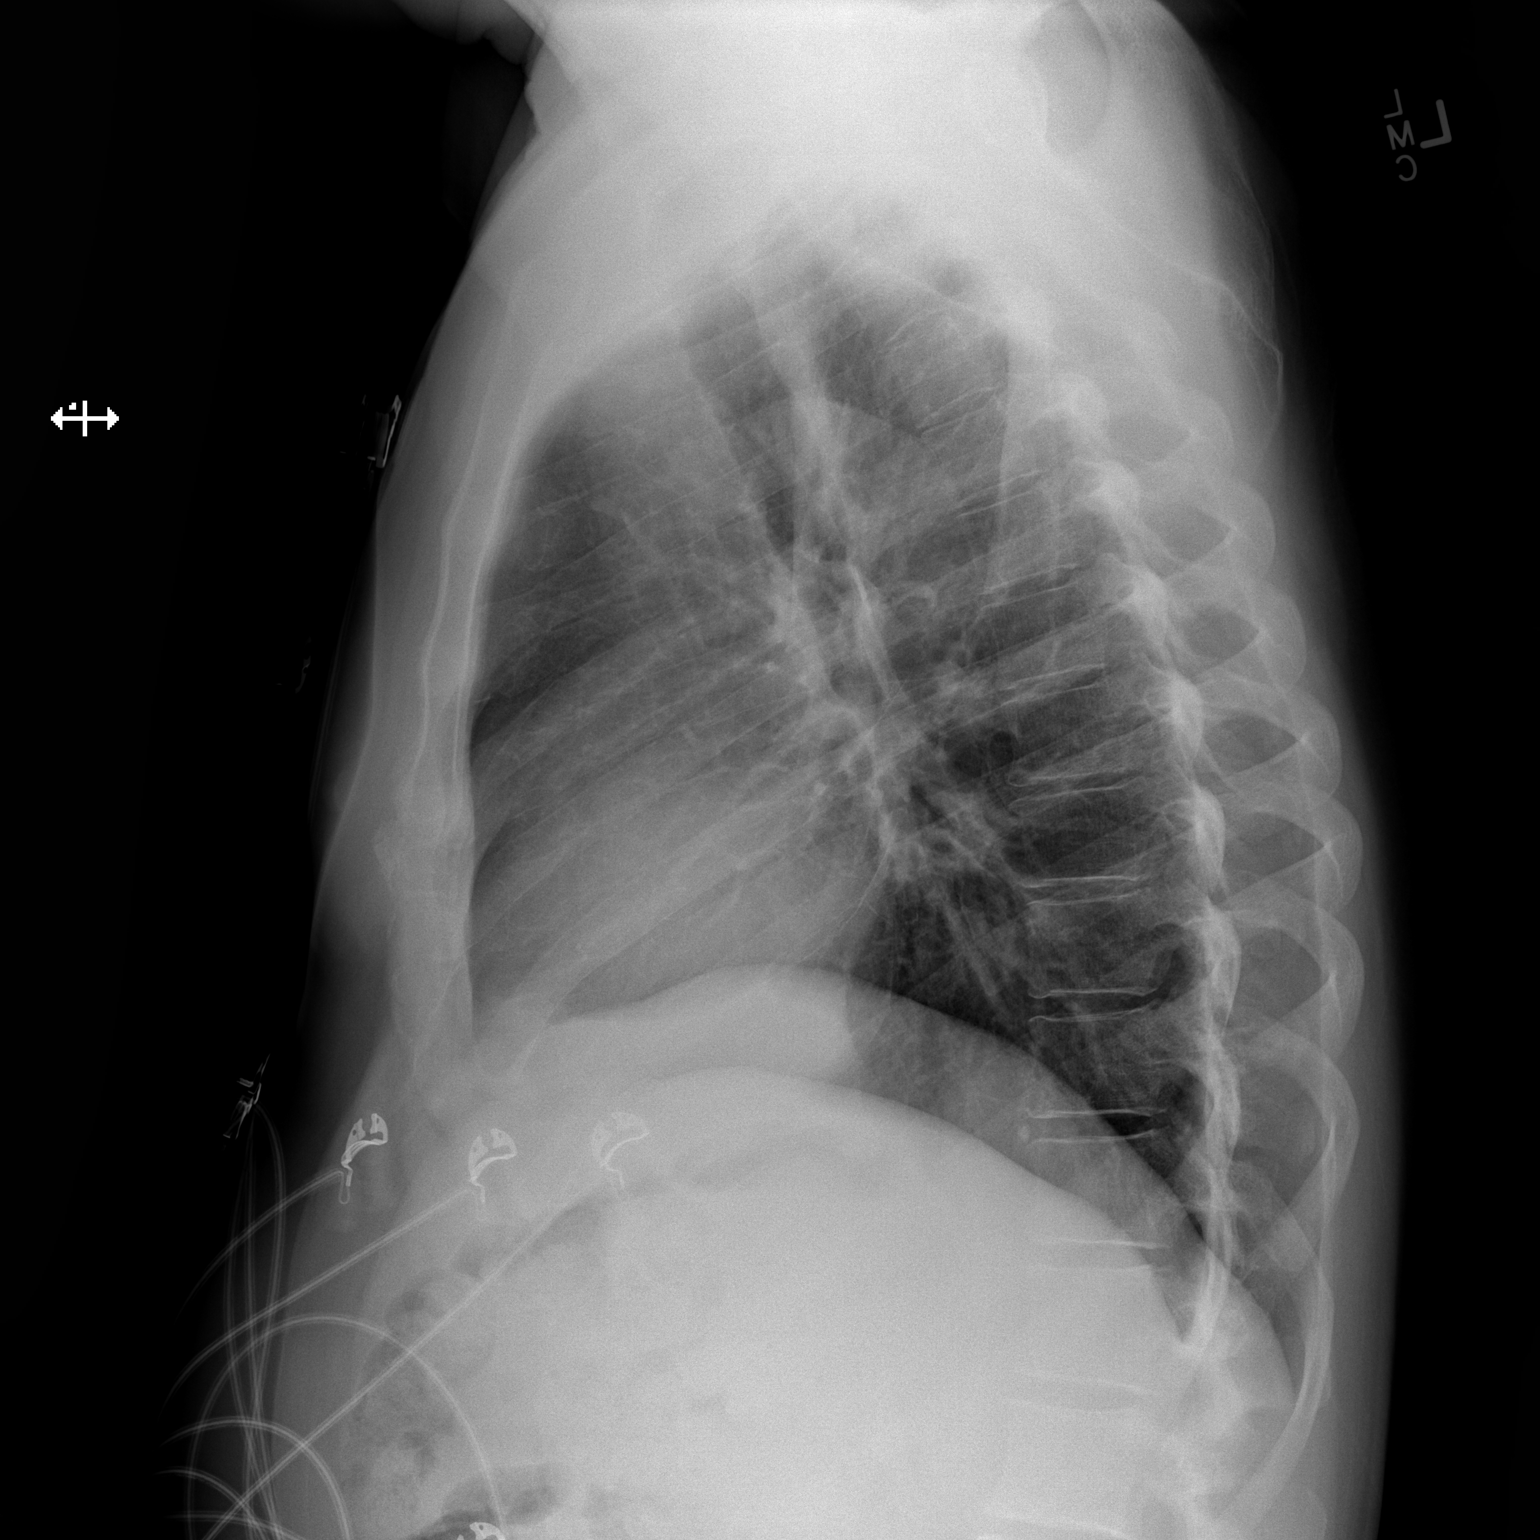

[2 of 2 positions shown; findings below may reference images not displayed]

FINDINGS: Cardiomediastinal silhouette is unremarkable. The lungs are clear
without pleural effusions or focal consolidations. Mildly elevated
right hemidiaphragm is stable in appearance. Trachea projects
midline and there is no pneumothorax. Soft tissue planes and
included osseous structures are non-suspicious. Multiple EKG lines
overlie the patient and may obscure subtle underlying pathology.
IMPRESSION: No acute cardiopulmonary process.

  By: Misa Haack
